# Patient Record
Sex: Female | Born: 1965 | Race: White | Hispanic: No | Marital: Married | Smoking: Never smoker
Health system: Southern US, Community
[De-identification: ages and names within clinical notes are randomized; demographics above are authoritative.]

## PROBLEM LIST (undated history)

## (undated) ENCOUNTER — Emergency Department (HOSPITAL_BASED_OUTPATIENT_CLINIC_OR_DEPARTMENT_OTHER): Payer: 59 | Source: Home / Self Care

## (undated) DIAGNOSIS — Z8672 Personal history of thrombophlebitis: Secondary | ICD-10-CM

## (undated) DIAGNOSIS — M1990 Unspecified osteoarthritis, unspecified site: Secondary | ICD-10-CM

## (undated) DIAGNOSIS — Z973 Presence of spectacles and contact lenses: Secondary | ICD-10-CM

## (undated) DIAGNOSIS — N840 Polyp of corpus uteri: Secondary | ICD-10-CM

## (undated) DIAGNOSIS — T7840XA Allergy, unspecified, initial encounter: Secondary | ICD-10-CM

## (undated) DIAGNOSIS — R112 Nausea with vomiting, unspecified: Secondary | ICD-10-CM

## (undated) DIAGNOSIS — J45990 Exercise induced bronchospasm: Secondary | ICD-10-CM

## (undated) DIAGNOSIS — R600 Localized edema: Secondary | ICD-10-CM

## (undated) DIAGNOSIS — Z862 Personal history of diseases of the blood and blood-forming organs and certain disorders involving the immune mechanism: Secondary | ICD-10-CM

## (undated) DIAGNOSIS — D649 Anemia, unspecified: Secondary | ICD-10-CM

## (undated) DIAGNOSIS — D259 Leiomyoma of uterus, unspecified: Secondary | ICD-10-CM

## (undated) DIAGNOSIS — Z9889 Other specified postprocedural states: Secondary | ICD-10-CM

## (undated) DIAGNOSIS — D219 Benign neoplasm of connective and other soft tissue, unspecified: Secondary | ICD-10-CM

## (undated) DIAGNOSIS — I8290 Acute embolism and thrombosis of unspecified vein: Secondary | ICD-10-CM

## (undated) DIAGNOSIS — J45909 Unspecified asthma, uncomplicated: Secondary | ICD-10-CM

## (undated) HISTORY — PX: APPENDECTOMY: SHX54

## (undated) HISTORY — DX: Allergy, unspecified, initial encounter: T78.40XA

## (undated) HISTORY — PX: BACK SURGERY: SHX140

## (undated) HISTORY — PX: INNER EAR SURGERY: SHX679

## (undated) HISTORY — PX: TYMPANOSTOMY TUBE PLACEMENT: SHX32

## (undated) HISTORY — PX: ANTERIOR FUSION LUMBAR SPINE: SUR629

## (undated) HISTORY — PX: LAPAROTOMY: SHX154

## (undated) HISTORY — PX: LAPAROSCOPIC GASTRIC BYPASS: SUR771

## (undated) HISTORY — PX: LUMBAR DISC SURGERY: SHX700

## (undated) HISTORY — PX: ROUX-EN-Y GASTRIC BYPASS: SHX1104

## (undated) HISTORY — PX: BREAST SURGERY: SHX581

## (undated) HISTORY — DX: Unspecified osteoarthritis, unspecified site: M19.90

## (undated) HISTORY — PX: REDUCTION MAMMAPLASTY: SUR839

---
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
## 1993-01-14 HISTORY — PX: REDUCTION MAMMAPLASTY: SUR839

## 1998-08-18 ENCOUNTER — Other Ambulatory Visit: Admission: RE | Admit: 1998-08-18 | Discharge: 1998-08-18 | Payer: Self-pay | Admitting: Obstetrics and Gynecology

## 2000-03-24 ENCOUNTER — Other Ambulatory Visit: Admission: RE | Admit: 2000-03-24 | Discharge: 2000-03-24 | Payer: Self-pay | Admitting: Family Medicine

## 2000-03-24 ENCOUNTER — Encounter: Payer: Self-pay | Admitting: Family Medicine

## 2000-03-24 LAB — CONVERTED CEMR LAB: Pap Smear: NORMAL

## 2001-08-25 ENCOUNTER — Other Ambulatory Visit: Admission: RE | Admit: 2001-08-25 | Discharge: 2001-08-25 | Payer: Self-pay | Admitting: Obstetrics and Gynecology

## 2001-11-03 ENCOUNTER — Encounter: Admission: RE | Admit: 2001-11-03 | Discharge: 2001-11-03 | Payer: Self-pay | Admitting: Obstetrics and Gynecology

## 2001-11-03 ENCOUNTER — Encounter: Payer: Self-pay | Admitting: Obstetrics and Gynecology

## 2002-12-15 ENCOUNTER — Other Ambulatory Visit: Admission: RE | Admit: 2002-12-15 | Discharge: 2002-12-15 | Payer: Self-pay | Admitting: Obstetrics and Gynecology

## 2003-01-26 ENCOUNTER — Encounter: Admission: RE | Admit: 2003-01-26 | Discharge: 2003-04-26 | Payer: Self-pay | Admitting: Surgery

## 2003-01-28 ENCOUNTER — Encounter: Admission: RE | Admit: 2003-01-28 | Discharge: 2003-01-28 | Payer: Self-pay | Admitting: Surgery

## 2003-02-02 ENCOUNTER — Encounter: Payer: Self-pay | Admitting: Cardiovascular Disease

## 2003-02-02 ENCOUNTER — Encounter: Admission: RE | Admit: 2003-02-02 | Discharge: 2003-02-02 | Payer: Self-pay | Admitting: Surgery

## 2003-02-02 ENCOUNTER — Ambulatory Visit (HOSPITAL_COMMUNITY): Admission: RE | Admit: 2003-02-02 | Discharge: 2003-02-02 | Payer: Self-pay | Admitting: Surgery

## 2003-03-28 ENCOUNTER — Inpatient Hospital Stay (HOSPITAL_COMMUNITY): Admission: RE | Admit: 2003-03-28 | Discharge: 2003-03-31 | Payer: Self-pay | Admitting: Surgery

## 2003-05-11 ENCOUNTER — Encounter: Admission: RE | Admit: 2003-05-11 | Discharge: 2003-08-09 | Payer: Self-pay | Admitting: Surgery

## 2003-09-21 ENCOUNTER — Encounter: Admission: RE | Admit: 2003-09-21 | Discharge: 2003-12-20 | Payer: Self-pay | Admitting: Surgery

## 2003-12-28 ENCOUNTER — Encounter: Admission: RE | Admit: 2003-12-28 | Discharge: 2004-03-27 | Payer: Self-pay | Admitting: Surgery

## 2004-03-14 ENCOUNTER — Other Ambulatory Visit: Admission: RE | Admit: 2004-03-14 | Discharge: 2004-03-14 | Payer: Self-pay | Admitting: Obstetrics and Gynecology

## 2004-03-29 ENCOUNTER — Ambulatory Visit: Payer: Self-pay | Admitting: Family Medicine

## 2004-04-11 ENCOUNTER — Encounter: Admission: RE | Admit: 2004-04-11 | Discharge: 2004-07-10 | Payer: Self-pay | Admitting: Surgery

## 2004-07-11 ENCOUNTER — Encounter: Admission: RE | Admit: 2004-07-11 | Discharge: 2004-10-09 | Payer: Self-pay | Admitting: Surgery

## 2004-10-24 ENCOUNTER — Encounter: Admission: RE | Admit: 2004-10-24 | Discharge: 2005-01-22 | Payer: Self-pay | Admitting: Surgery

## 2005-01-03 ENCOUNTER — Ambulatory Visit: Payer: Self-pay | Admitting: Family Medicine

## 2005-01-15 ENCOUNTER — Ambulatory Visit: Payer: Self-pay | Admitting: Family Medicine

## 2005-02-06 ENCOUNTER — Encounter: Admission: RE | Admit: 2005-02-06 | Discharge: 2005-05-07 | Payer: Self-pay | Admitting: Surgery

## 2005-05-08 ENCOUNTER — Other Ambulatory Visit: Admission: RE | Admit: 2005-05-08 | Discharge: 2005-05-08 | Payer: Self-pay | Admitting: Obstetrics and Gynecology

## 2005-05-15 ENCOUNTER — Encounter: Admission: RE | Admit: 2005-05-15 | Discharge: 2005-05-15 | Payer: Self-pay | Admitting: Surgery

## 2005-08-22 ENCOUNTER — Ambulatory Visit: Payer: Self-pay | Admitting: Family Medicine

## 2006-04-14 ENCOUNTER — Ambulatory Visit (HOSPITAL_COMMUNITY): Admission: RE | Admit: 2006-04-14 | Discharge: 2006-04-14 | Payer: Self-pay | Admitting: Surgery

## 2006-05-29 ENCOUNTER — Ambulatory Visit (HOSPITAL_COMMUNITY): Admission: RE | Admit: 2006-05-29 | Discharge: 2006-05-29 | Payer: Self-pay | Admitting: Orthopedic Surgery

## 2006-06-10 ENCOUNTER — Encounter (INDEPENDENT_AMBULATORY_CARE_PROVIDER_SITE_OTHER): Payer: Self-pay | Admitting: *Deleted

## 2006-08-20 ENCOUNTER — Encounter: Admission: RE | Admit: 2006-08-20 | Discharge: 2006-08-20 | Payer: Self-pay | Admitting: Obstetrics and Gynecology

## 2006-11-04 ENCOUNTER — Ambulatory Visit: Payer: Self-pay | Admitting: Family Medicine

## 2006-11-04 DIAGNOSIS — E785 Hyperlipidemia, unspecified: Secondary | ICD-10-CM | POA: Insufficient documentation

## 2006-11-04 DIAGNOSIS — L708 Other acne: Secondary | ICD-10-CM | POA: Insufficient documentation

## 2006-11-04 DIAGNOSIS — F988 Other specified behavioral and emotional disorders with onset usually occurring in childhood and adolescence: Secondary | ICD-10-CM | POA: Insufficient documentation

## 2006-11-04 DIAGNOSIS — M549 Dorsalgia, unspecified: Secondary | ICD-10-CM | POA: Insufficient documentation

## 2006-11-05 ENCOUNTER — Encounter: Admission: RE | Admit: 2006-11-05 | Discharge: 2006-11-05 | Payer: Self-pay | Admitting: Family Medicine

## 2006-11-05 ENCOUNTER — Encounter: Payer: Self-pay | Admitting: Family Medicine

## 2006-11-07 ENCOUNTER — Ambulatory Visit (HOSPITAL_COMMUNITY): Admission: RE | Admit: 2006-11-07 | Discharge: 2006-11-08 | Payer: Self-pay | Admitting: Neurosurgery

## 2006-12-26 ENCOUNTER — Ambulatory Visit (HOSPITAL_COMMUNITY): Admission: RE | Admit: 2006-12-26 | Discharge: 2006-12-27 | Payer: Self-pay | Admitting: Neurosurgery

## 2007-05-11 ENCOUNTER — Ambulatory Visit (HOSPITAL_COMMUNITY): Admission: RE | Admit: 2007-05-11 | Discharge: 2007-05-11 | Payer: Self-pay | Admitting: Neurosurgery

## 2007-05-18 ENCOUNTER — Ambulatory Visit: Payer: Self-pay | Admitting: *Deleted

## 2007-05-18 ENCOUNTER — Inpatient Hospital Stay (HOSPITAL_COMMUNITY): Admission: RE | Admit: 2007-05-18 | Discharge: 2007-05-22 | Payer: Self-pay | Admitting: Neurosurgery

## 2007-09-03 ENCOUNTER — Encounter (INDEPENDENT_AMBULATORY_CARE_PROVIDER_SITE_OTHER): Payer: Self-pay | Admitting: *Deleted

## 2007-09-03 ENCOUNTER — Ambulatory Visit: Payer: Self-pay | Admitting: Internal Medicine

## 2007-09-03 DIAGNOSIS — R5383 Other fatigue: Secondary | ICD-10-CM

## 2007-09-03 DIAGNOSIS — D649 Anemia, unspecified: Secondary | ICD-10-CM

## 2007-09-03 DIAGNOSIS — R5381 Other malaise: Secondary | ICD-10-CM | POA: Insufficient documentation

## 2007-09-03 LAB — CONVERTED CEMR LAB
Folate: 7.8 ng/mL
HCT: 36.6 % (ref 36.0–46.0)
Hemoglobin: 12.4 g/dL (ref 12.0–15.0)
Iron: 42 ug/dL (ref 42–145)
Saturation Ratios: 8.1 % — ABNORMAL LOW (ref 20.0–50.0)
Vit D, 1,25-Dihydroxy: 38 (ref 30–89)

## 2007-09-16 ENCOUNTER — Encounter: Admission: RE | Admit: 2007-09-16 | Discharge: 2007-09-16 | Payer: Self-pay | Admitting: Obstetrics and Gynecology

## 2008-03-26 IMAGING — CR DG LUMBAR SPINE 1V
1 series · 1 of 1 positions shown · non-contrast
Comparison: none

CLINICAL DATA: L5-S1 microdiskectomy.
 PORTABLE LATERAL LUMBAR SPINE ? 1 VIEW:

[view not recorded]
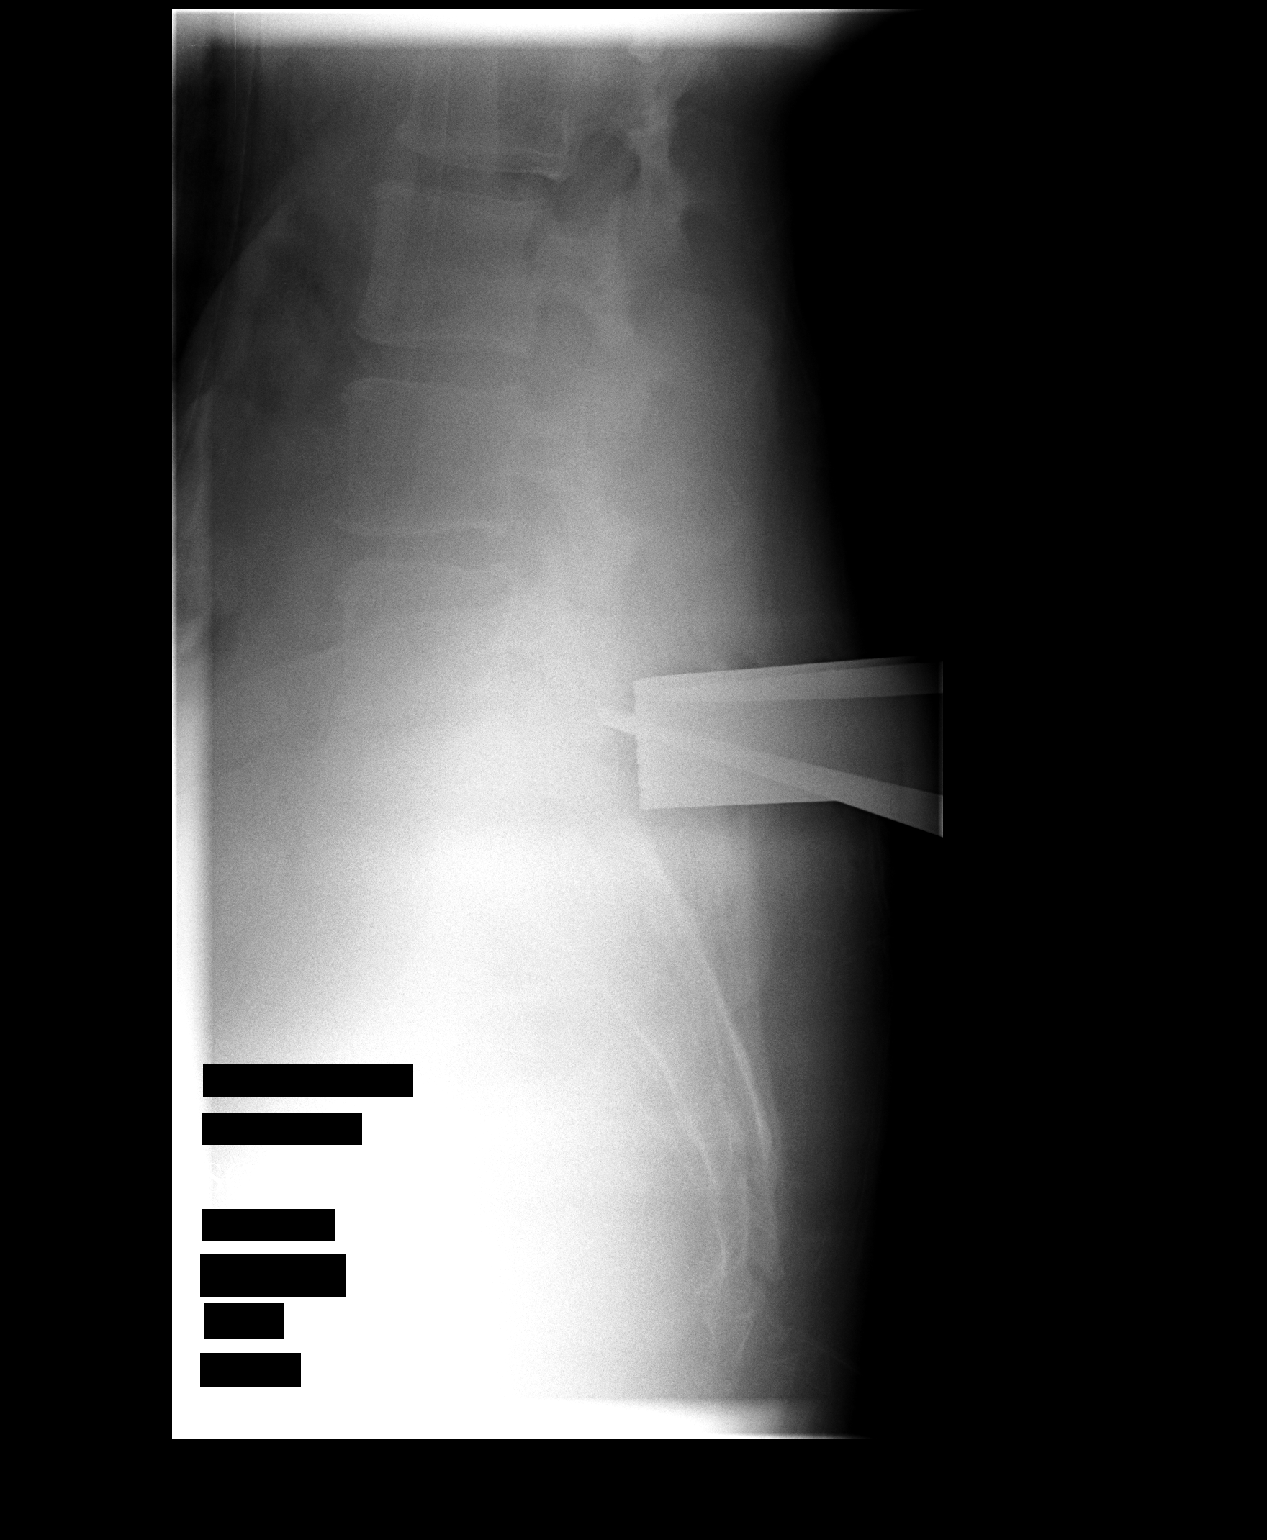

[1 of 1 positions shown; findings below may reference images not displayed]

FINDINGS: Tissue spreaders are in place posteriorly with probe directed at the L5-S1 disk space level.
IMPRESSION: As discussed above.

## 2008-04-13 ENCOUNTER — Ambulatory Visit: Payer: Self-pay | Admitting: Family Medicine

## 2008-05-11 ENCOUNTER — Encounter: Payer: Self-pay | Admitting: Family Medicine

## 2008-06-10 ENCOUNTER — Telehealth (INDEPENDENT_AMBULATORY_CARE_PROVIDER_SITE_OTHER): Payer: Self-pay | Admitting: *Deleted

## 2008-06-22 ENCOUNTER — Telehealth (INDEPENDENT_AMBULATORY_CARE_PROVIDER_SITE_OTHER): Payer: Self-pay | Admitting: *Deleted

## 2008-10-28 ENCOUNTER — Telehealth: Payer: Self-pay | Admitting: Family Medicine

## 2008-11-08 ENCOUNTER — Telehealth: Payer: Self-pay | Admitting: Family Medicine

## 2008-11-14 ENCOUNTER — Encounter: Admission: RE | Admit: 2008-11-14 | Discharge: 2008-11-14 | Payer: Self-pay | Admitting: Obstetrics and Gynecology

## 2008-12-13 ENCOUNTER — Telehealth: Payer: Self-pay | Admitting: Family Medicine

## 2009-01-27 ENCOUNTER — Telehealth: Payer: Self-pay | Admitting: Family Medicine

## 2009-03-28 ENCOUNTER — Telehealth: Payer: Self-pay | Admitting: Family Medicine

## 2009-05-08 ENCOUNTER — Telehealth: Payer: Self-pay | Admitting: Family Medicine

## 2009-05-08 DIAGNOSIS — J45909 Unspecified asthma, uncomplicated: Secondary | ICD-10-CM | POA: Insufficient documentation

## 2009-05-24 ENCOUNTER — Telehealth: Payer: Self-pay | Admitting: Family Medicine

## 2009-06-07 ENCOUNTER — Ambulatory Visit: Payer: Self-pay | Admitting: Critical Care Medicine

## 2009-07-31 ENCOUNTER — Telehealth: Payer: Self-pay | Admitting: Family Medicine

## 2009-08-02 ENCOUNTER — Ambulatory Visit: Payer: Self-pay | Admitting: Family Medicine

## 2009-08-02 DIAGNOSIS — M79609 Pain in unspecified limb: Secondary | ICD-10-CM | POA: Insufficient documentation

## 2009-08-09 ENCOUNTER — Ambulatory Visit: Payer: Self-pay | Admitting: Critical Care Medicine

## 2009-09-11 ENCOUNTER — Telehealth: Payer: Self-pay | Admitting: Family Medicine

## 2009-10-04 ENCOUNTER — Telehealth: Payer: Self-pay | Admitting: Family Medicine

## 2009-10-25 ENCOUNTER — Ambulatory Visit: Payer: Self-pay | Admitting: Family Medicine

## 2009-10-25 ENCOUNTER — Telehealth: Payer: Self-pay | Admitting: Family Medicine

## 2009-10-25 LAB — CONVERTED CEMR LAB
BUN: 11 mg/dL (ref 6–23)
Basophils Absolute: 0.1 10*3/uL (ref 0.0–0.1)
Chloride: 106 meq/L (ref 96–112)
Creatinine, Ser: 0.8 mg/dL (ref 0.4–1.2)
Eosinophils Relative: 0.7 % (ref 0.0–5.0)
Glucose, Bld: 149 mg/dL — ABNORMAL HIGH (ref 70–99)
Lymphocytes Relative: 26.4 % (ref 12.0–46.0)
Lymphs Abs: 2.2 10*3/uL (ref 0.7–4.0)
MCV: 84.9 fL (ref 78.0–100.0)
Monocytes Relative: 6.6 % (ref 3.0–12.0)
Neutro Abs: 5.4 10*3/uL (ref 1.4–7.7)
Sodium: 140 meq/L (ref 135–145)

## 2009-11-22 ENCOUNTER — Encounter: Admission: RE | Admit: 2009-11-22 | Discharge: 2009-11-22 | Payer: Self-pay | Admitting: Obstetrics and Gynecology

## 2009-12-12 ENCOUNTER — Encounter: Payer: Self-pay | Admitting: Family Medicine

## 2010-02-13 NOTE — Progress Notes (Signed)
  Phone Note Call from Patient   Summary of Call: needs refil vyvanse   Follow-up for Phone Call        will px and give to her  Follow-up by: Judith Part MD,  January 27, 2009 5:32 PM    Prescriptions: VYVANSE 70 MG CAPS (LISDEXAMFETAMINE DIMESYLATE) 1 by mouth once daily  #30 x 0   Entered and Authorized by:   Judith Part MD   Signed by:   Judith Part MD on 01/27/2009   Method used:   Print then Give to Patient   RxID:   [id number]

## 2010-02-13 NOTE — Assessment & Plan Note (Signed)
Summary: Pulmonary OV   CC:  2 month follow up.  Pt states she is still having SOB when climbing stairs but overall qvar is helping.  Wheezing with extreme exertion.  Nonprod cough..  History of Present Illness: Pulmonary OV 45 yo WF with moderate persistent asthma and GERD.  EIA.  This is a 45 year old female referred  for  dyspnea, cough.   Patient has had symptoms since December, 2010.   Symptoms only come on with exercise.  There is associated dry cough. There's no real mucus. There is occasional wheezing. There is no childhood  history of asthma.   The pt  has had a barking cough exacerbated with bronchitis twice yearly for the past several years.   The symptoms are worse in the winter.  There has not been any skin testing as an adult.   She is only using Advair on a p.r.n. basis. Her exercise. She has used albuterol the past without much improvement. Her symptoms worsen  as she becomes more cardiovascular stressed  with exercise as the heart rate increases.  She has not had recent corticosteroids for her symptom complex. There is a family hx of asthma and allergies.  There was passive smoke exposure growing up with the pts father  August 09, 2009 4:16 PM Pt is improved with less cough and dyspnea.  There is no chest pain.  There is no wheeze.  The cough is much better.   PFM up to 450. Pt notes more pn drip in the heat and humidity August 09, 2009 4:16 PM Seems some better.  ? broncitis last week with barky cough but did not progress,  still clears throat.  did increase qvar and this helped PFM:  350 best ever.  afer inhalers etc up to 450 still mucus in throat area   Asthma History    Asthma Control Assessment:    Age range: 12+ years    Symptoms: 0-2 days/week    Nighttime Awakenings: 0-2/month    Interferes w/ normal activity: no limitations    SABA use (not for EIB): 0-2 days/week    ATAQ questionnaire: 0    FEV1: 2.69 liters (today)    FEV1 Pred: 3.13 liters (today)  Exacerbations requiring oral systemic steroids: 0-1/year    Asthma Control Assessment: Well Controlled  Preventive Screening-Counseling & Management  Alcohol-Tobacco     Smoking Status: never     Passive Smoke Exposure: yes  Current Medications (verified): 1)  Yaz 3-0.02 Mg  Tabs (Drospirenone-Ethinyl Estradiol) .... Take By Mouth Once Daily As Directed 2)  Vyvanse 70 Mg Caps (Lisdexamfetamine Dimesylate) .Marland Kitchen.. 1 By Mouth Once Daily 3)  Allegra 180 Mg Tabs (Fexofenadine Hcl) .... Once Daily 4)  Qvar 40 Mcg/act  Aers (Beclomethasone Dipropionate) .... Two Puffs Twice Daily 5)  Prilosec Otc 20 Mg Tbec (Omeprazole Magnesium) .... Once Daily As Needed 6)  Proair Hfa 108 (90 Base) Mcg/act  Aers (Albuterol Sulfate) .Marland Kitchen.. 1-2 Puffs Every 4-6 Hours As Needed 7)  Caltrate 600+d 600-400 Mg-Unit Tabs (Calcium Carbonate-Vitamin D) .... Take 1 Tablet By Mouth Two Times A Day 8)  Vitamin D3 1000 Unit Tabs (Cholecalciferol) .... Take 1 Tablet By Mouth Once A Day  Allergies (verified): 1)  ! Levaquin (Levofloxacin) 2)  * Medical Tape  Past History:  Past medical, surgical, family and social histories (including risk factors) reviewed, and no changes noted (except as noted below).  Past Medical History: Reviewed history from 04/13/2008 and no changes required. ADD obesity- with weight  loss surgery LS deg disc dz with multiple surgeries   Past Surgical History: Reviewed history from 04/13/2008 and no changes required. Laparoscopy- ovarian cyst Appendectomy Breast reduction (1995) Gastric bypass (03/2000) several LS surg including fusion  Family History: Reviewed history from 06/07/2009 and no changes required. Father: Lung COPD/asthma, smoker Mother: HTN Siblings:  Brother asthma,  sister with allergies  Social History: Reviewed history from 04/13/2008 and no changes required. 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  The patient denies shortness of breath at rest, productive cough, non-productive cough, coughing up blood, chest pain, irregular heartbeats, acid heartburn, indigestion, loss of appetite, weight change, abdominal pain, difficulty swallowing, sore throat, tooth/dental problems, headaches, nasal congestion/difficulty breathing through nose, sneezing, itching, ear ache, anxiety, depression, hand/feet swelling, joint stiffness or pain, rash, change in color of mucus, and fever.    Vital Signs:  Patient profile:   45 year old female Height:      66.5 inches Weight:      234 pounds BMI:     37.34 O2 Sat:      100 % on Room air Temp:     98.4 degrees F oral Pulse rate:   72 / minute BP sitting:   106 / 76  (left arm) Cuff size:   large  Vitals Entered By: Gweneth Dimitri RN (August 09, 2009 3:59 PM)  O2 Flow:  Room air CC: 2 month follow up.  Pt states she is still having SOB when climbing stairs but overall qvar is helping.  Wheezing with extreme exertion.  Nonprod cough. Comments Medications reviewed with patient Daytime contact number verified with patient. Gweneth Dimitri RN  August 09, 2009 4:01 PM    Physical Exam  General:  normal appearance   Eyes:  PERRLA/EOM intact; conjunctiva and sclera clear Nose:  clear nasal discharge and erythema.   Mouth:  no deformity or lesions, post nasal drainage seen Neck:  no masses, thyromegaly, or abnormal cervical nodes Lungs:  decreased BS bilateral improved Heart:  regular rate and rhythm, S1, S2 without murmurs, rubs, gallops, or clicks Abdomen:  bowel sounds positive; abdomen soft and non-tender without masses, or organomegaly obese Msk:  no deformity or scoliosis noted with normal posture Pulses:  pulses normal Extremities:  no clubbing, cyanosis, edema, or deformity noted Neurologic:  CN II-XII grossly intact with normal reflexes, coordination, muscle  strength and tone Skin:  intact without lesions or rashes Cervical Nodes:  no significant adenopathy Axillary Nodes:  no significant adenopathy Psych:  alert and cooperative; normal mood and affect; normal attention span and concentration   Pre-Spirometry FEV1    Value: 2.69 L     Pred: 3.13 L     Impression & Recommendations:  Problem # 1:  ASTHMA, PERSISTENT (ICD-493.90) Assessment Improved Moderate persistent asthma better on maintainence ICS. plan cont as needed SABA prior to exercise Cont Qvar two puff two times a day Veramyst trial for rhinitis  Medications Added to Medication List This Visit: 1)  Prilosec Otc 20 Mg Tbec (Omeprazole magnesium) .... Once daily as needed 2)  Caltrate 600+d 600-400 Mg-unit Tabs (Calcium carbonate-vitamin d) .... Take 1 tablet by mouth two times a day 3)  Vitamin D3 1000 Unit Tabs (Cholecalciferol) .... Take 1 tablet by mouth once a day  Complete Medication List: 1)  Yaz 3-0.02 Mg Tabs (Drospirenone-ethinyl  estradiol) .... Take by mouth once daily as directed 2)  Vyvanse 70 Mg Caps (Lisdexamfetamine dimesylate) .Marland Kitchen.. 1 by mouth once daily 3)  Allegra 180 Mg Tabs (Fexofenadine hcl) .... Once daily 4)  Qvar 40 Mcg/act Aers (Beclomethasone dipropionate) .... Two puffs twice daily 5)  Prilosec Otc 20 Mg Tbec (Omeprazole magnesium) .... Once daily as needed 6)  Proair Hfa 108 (90 Base) Mcg/act Aers (Albuterol sulfate) .Marland Kitchen.. 1-2 puffs every 4-6 hours as needed 7)  Caltrate 600+d 600-400 Mg-unit Tabs (Calcium carbonate-vitamin d) .... Take 1 tablet by mouth two times a day 8)  Vitamin D3 1000 Unit Tabs (Cholecalciferol) .... Take 1 tablet by mouth once a day  Other Orders: Est. Patient Level III (16109)   Patient Instructions: 1)  Stay with Qvar two puff twice daily for now 2)  Proair prior to exercise and as needed 3)  Try veramyst two sprays each nostril daily use samples , I did not send refill, just use samples 4)  Return as needed  ,   just call if you would like me to recheck

## 2010-02-13 NOTE — Assessment & Plan Note (Signed)
Summary: ANKLE PAIN/CBS   History of Present Illness: 45 yo woman here today for L foot pain.  pain w/ weight bearing.  heard a popping while in her yard.  pain increased yesterday while working out.  no bruise or swelling.  Allergies (verified): 1)  ! Levaquin (Levofloxacin) 2)  * Medical Tape  Review of Systems      See HPI  Physical Exam  Msk:  + TTP over L talar dome, no TTP over 4th or 5th metatarsal.  + dropped 2nd, possibly 3rd metatarsal head(s). Pulses:  +2 DP/PT Extremities:  no C/C/E   Impression & Recommendations:  Problem # 1:  FOOT PAIN (ICD-729.5) Assessment New  pain possibly due to dropped metatarsal head- could have been popping she heard.  must r/o stress fx given recent increase in exercise.  check Xray.  ice, NSAIDs.  ortho if no improvement.  Orders: T-Foot Left Min 3 Views (73630TC) No Charge Patient Arrived (NCPA0) (NCPA0)  Complete Medication List: 1)  Yaz 3-0.02 Mg Tabs (Drospirenone-ethinyl estradiol) .... Take by mouth once daily as directed 2)  Vyvanse 70 Mg Caps (Lisdexamfetamine dimesylate) .Marland Kitchen.. 1 by mouth once daily 3)  Vivotif Berna Vaccine Cpdr (Typhoid vaccine) .Marland Kitchen.. 1 by mouth every other day,  1 week prior to trip 4)  Allegra 180 Mg Tabs (Fexofenadine hcl) .... Once daily 5)  Qvar 40 Mcg/act Aers (Beclomethasone dipropionate) .... Two puffs twice daily 6)  Prilosec Otc 20 Mg Tbec (Omeprazole magnesium) .... Once daily 7)  Proair Hfa 108 (90 Base) Mcg/act Aers (Albuterol sulfate) .Marland Kitchen.. 1-2 puffs every 4-6 hours as needed

## 2010-02-13 NOTE — Miscellaneous (Signed)
Summary: bronchitis  Clinical Lists Changes  pt has been sick for 6 days.  subjective fevers, nasal congestion, deep cough, body aches.  unable to sleep due to cough.  lungs w/out crackles or wheeze on PE, coarse BS at bases.  will start Zpack for bronchitis and Tussionex for cough/sleep.  Neena Rhymes MD  December 12, 2009 11:08 AM.   Prescriptions: Sandria Senter ER 10-8 MG/5ML LQCR (HYDROCOD POLST-CHLORPHEN POLST) 1 tsp two times a day as needed for cough.  #140ml x 0   Entered and Authorized by:   Neena Rhymes MD   Signed by:   Neena Rhymes MD on 12/12/2009   Method used:   Print then Give to Patient   RxID:   [id number] AZITHROMYCIN 250 MG  TABS (AZITHROMYCIN) 2 by  mouth today and then 1 daily for 4 days  #6 x 0   Entered and Authorized by:   Neena Rhymes MD   Signed by:   Neena Rhymes MD on 12/12/2009   Method used:   Electronically to        [facility]  [address] (retail)       [address]       [place], [place]  [address]       Ph: [phone] or [phone]       Fax: [phone]   RxID:   [id number]

## 2010-02-13 NOTE — Progress Notes (Signed)
Summary: thumb tendonitis  Phone Note Call from Patient   Caller: Patient Summary of Call: pt has tendonitis of R thumb.  is R hand dominant.  needs to type for work.  daily celebrex and/or advil w/out relief.  has been wearing thumb splint w/out relief.  will start Prednisone and if no better, she is to call ortho.  Pt expresses understanding and is in agreement w/ this plan. Initial call taken by: Neena Rhymes MD,  September 11, 2009 11:24 AM    New/Updated Medications: PREDNISONE 20 MG TABS (PREDNISONE) 2 tabs daily x7 days Prescriptions: PREDNISONE 20 MG TABS (PREDNISONE) 2 tabs daily x7 days  #14 x 0   Entered and Authorized by:   Neena Rhymes MD   Signed by:   Neena Rhymes MD on 09/11/2009   Method used:   [facility] to        [facility]  [address] (retail)       [address]       [place], [place]  [address]       Ph: [phone] or [phone]       Fax: [phone]   RxID:   [id number]

## 2010-02-13 NOTE — Progress Notes (Signed)
Summary: Pulumonary appt scheduled by pt.   Phone Note Call from Patient   Summary of Call: asthma is worse- has been on a bunch of meds - worse with exercise needs ref for appt with Dr Delford Field in pulm   Follow-up for Phone Call        Pt scheduled appt herself already.Marland KitchenMarland KitchenDaine Gip  May 08, 2009 5:04 PM Follow-up by: Daine Gip,  May 08, 2009 5:04 PM  New Problems: ASTHMA, PERSISTENT (ICD-493.90)   New Problems: ASTHMA, PERSISTENT (ICD-493.90)

## 2010-02-13 NOTE — Progress Notes (Signed)
  Phone Note Call from Patient   Summary of Call: needs refil of vyvanse  Follow-up for Phone Call        printed for pick up Follow-up by: Judith Part MD,  July 31, 2009 10:30 AM    Prescriptions: VYVANSE 70 MG CAPS (LISDEXAMFETAMINE DIMESYLATE) 1 by mouth once daily  #30 x 0   Entered and Authorized by:   Judith Part MD   Signed by:   Judith Part MD on 07/31/2009   Method used:   Print then Give to Patient   RxID:   [id number]

## 2010-02-13 NOTE — Progress Notes (Signed)
  Phone Note Call from Patient   Summary of Call: needs refil of vyvanse for [facility]  Follow-up for Phone Call        printed and delivered  Follow-up by: Judith Part MD,  October 04, 2009 1:24 PM    Prescriptions: VYVANSE 70 MG CAPS (LISDEXAMFETAMINE DIMESYLATE) 1 by mouth once daily  #90 x 0   Entered and Authorized by:   Judith Part MD   Signed by:   Judith Part MD on 10/04/2009   Method used:   Print then Give to Patient   RxID:   [id number]

## 2010-02-13 NOTE — Progress Notes (Signed)
Summary: fatigue/GI illness  Phone Note Call from Patient   Caller: Patient Summary of Call: pt has been dealing w/ recent GI illness.  now having extreme fatigue.  fatigue pre-dated illness but has worsened w/ recent illness.  concerned b/c fatigue seems above and beyond normal viral sxs.  will have pt get labs to assess thyroid, r/o anemia, and assess electrolytes. Initial call taken by: Neena Rhymes MD,  October 25, 2009 8:39 AM  New Problems: FATIGUE (ICD-780.79)   New Problems: FATIGUE (ICD-780.79)

## 2010-02-13 NOTE — Progress Notes (Signed)
  Phone Note Call from Patient   Summary of Call: needs vyvanse px Initial call taken by: Judith Part MD,  March 28, 2009 10:37 AM  Follow-up for Phone Call        px done for pick up Follow-up by: Judith Part MD,  March 28, 2009 10:37 AM    New/Updated Medications: VYVANSE 70 MG CAPS (LISDEXAMFETAMINE DIMESYLATE) 1 by mouth once daily Prescriptions: VYVANSE 70 MG CAPS (LISDEXAMFETAMINE DIMESYLATE) 1 by mouth once daily  #30 x 0   Entered and Authorized by:   Judith Part MD   Signed by:   Judith Part MD on 03/28/2009   Method used:   Print then Give to Patient   RxID:   [id number]

## 2010-02-13 NOTE — Progress Notes (Signed)
  Phone Note Call from Patient   Summary of Call: needs vyvanse px  Initial call taken by: Judith Part MD,  May 24, 2009 11:16 AM  Follow-up for Phone Call        printed in put in nurse in box for pickup  Follow-up by: Judith Part MD,  May 24, 2009 11:16 AM    Prescriptions: VYVANSE 70 MG CAPS (LISDEXAMFETAMINE DIMESYLATE) 1 by mouth once daily  #30 x 0   Entered and Authorized by:   Judith Part MD   Signed by:   Judith Part MD on 05/24/2009   Method used:   Print then Give to Patient   RxID:   [id number]

## 2010-02-13 NOTE — Assessment & Plan Note (Signed)
Summary: Pulmonary Consultation   CC:  possible ashtma, new consult, and cough and dyspnea.  History of Present Illness: Pulmonary Consultation       This is a 45 year old female who presents with cough and dyspnea.  The patient complains of shortness of breath, chest tightness, wheezing, cough, and exercise induced symptoms, but denies history of diagnosed COPD, chest pain worse with breathing and coughing, mucous production, nocturnal awakening, and congestion.  The cough is described as non-productive and perceived to be from lower airway.  The dyspnea is described as dyspnea with exertion and associated with wheezing.  The patient reports a history of allergic rhinitis.  The patient denies any history of asthma, COPD, sleep disordered breathing, obstructive sleep apnea, heart disease, thyroid disease, anemia, collagen vascular disease, osteporosis, kyphoscoliosis, occupational exposure: , and cancer: .  Prior effective therapies include oral inhaled steroids.  Ineffective therapies have included beta agonists.    This is a 46 year old female referred  for  dyspnea, cough.   Patient has had symptoms since December, 2010.   Symptoms only come on with exercise.  There is associated dry cough. There's no real mucus. There is occasional wheezing. There is no childhood  history of asthma.   The pt  has had a barking cough exacerbated with bronchitis twice yearly for the past several years.   The symptoms are worse in the winter.  There has not been any skin testing as an adult.   She is only using Advair on a p.r.n. basis. Her exercise. She has used albuterol the past without much improvement. Her symptoms worsen  as she becomes more cardiovascular stressed  with exercise as the heart rate increases.  She has not had recent corticosteroids for her symptom complex. There is a family hx of asthma and allergies.  There was passive smoke exposure growing up with the pts father     Asthma History  Initial Asthma Severity Rating:    Age range: 12+ years    Symptoms: 0-2 days/week    Nighttime Awakenings: 0-2/month    Interferes w/ normal activity: no limitations    SABA use (not for EIB): 0-2 days/week    Exacerbations requiring oral systemic steroids: 0-1/year    Asthma Severity Assessment: Intermittent   Preventive Screening-Counseling & Management  Alcohol-Tobacco     Smoking Status: never     Passive Smoke Exposure: yes  Comments: Father was a heavy smoker   Current Medications (verified): 1)  Yaz 3-0.02 Mg  Tabs (Drospirenone-Ethinyl Estradiol) .... Take By Mouth Once Daily As Directed 2)  Vyvanse 70 Mg Caps (Lisdexamfetamine Dimesylate) .Marland Kitchen.. 1 By Mouth Once Daily 3)  Vivotif Berna Vaccine  Cpdr (Typhoid Vaccine) .Marland Kitchen.. 1 By Mouth Every Other Day,  1 Week Prior To Trip 4)  Allegra 180 Mg Tabs (Fexofenadine Hcl) .... Once Daily 5)  Advair Diskus 250-50 Mcg/dose Aepb (Fluticasone-Salmeterol) .... As Needed 1 Inhalation  Twice Daily 6)  Prilosec Otc 20 Mg Tbec (Omeprazole Magnesium) .... Once Daily  Allergies: 1)  ! Levaquin (Levofloxacin) 2)  * Medical Tape  Past History:  Past Medical History: Reviewed history from 04/13/2008 and no changes required. 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 Laparoscopy- ovarian cyst Appendectomy Breast reduction (1995) Gastric bypass (03/2000) several LS surg including fusion  Family History: Reviewed history from 11/04/2006 and no changes required. Father: Lung COPD/asthma, smoker Mother: HTN  Siblings:  Brother asthma,  sister with allergies  Social History: Reviewed history from 04/13/2008 and no changes required. 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  The patient denies shortness of breath at rest, productive cough, non-productive cough, coughing up blood, chest pain, irregular heartbeats, acid heartburn, loss of appetite, weight change, abdominal pain, difficulty swallowing, tooth/dental problems, headaches, nasal congestion/difficulty breathing through nose, itching, hand/feet swelling, joint stiffness or pain, rash, change in color of mucus, and fever.         Patient states she often feels like she has fluid in her ears.      See HPI for Pulmonary, Cardiac, General, and ENT review of systems.  Vital Signs:  Patient profile:   45 year old female Height:      66.5 inches Weight:      242 pounds BMI:     38.61 O2 Sat:      100 % on Room air Temp:     98.7 degrees F oral Pulse rate:   79 / minute BP sitting:   128 / 88  (left arm) Cuff size:   large  Vitals Entered By: Denna Haggard, CMA (Jun 07, 2009 3:38 PM)  Nutrition Counseling: Patient's BMI is greater than 25 and therefore counseled on weight management options.  O2 Sat at Rest %:  100% O2 Flow:  Room air  Reason for Visit Asthma confirmation  Physical Exam  General:  normal appearance and obese.   Eyes:  PERRLA/EOM intact; conjunctiva and sclera clear Nose:  clear nasal discharge and erythema.   Mouth:  no deformity or lesions, post nasal drainage seen Neck:  no masses, thyromegaly, or abnormal cervical nodes Lungs:  decreased BS bilateral.   Heart:  regular rate and rhythm, S1, S2 without murmurs, rubs, gallops, or clicks Abdomen:  bowel sounds positive; abdomen soft and non-tender without masses, or organomegaly obese Msk:  no deformity or scoliosis noted with normal posture Pulses:  pulses normal Extremities:  no clubbing, cyanosis, edema, or deformity noted Neurologic:  CN II-XII grossly intact with normal reflexes, coordination, muscle strength and tone Skin:  intact  without lesions or rashes Cervical Nodes:  no significant adenopathy Axillary Nodes:  no significant adenopathy Inguinal Nodes:  no significant adenopathy Psych:  alert and cooperative; normal mood and affect; normal attention span and concentration  Immunoglobulin E          66.5 IU/mL                  0.0-180.0  CXR  Procedure date:  06/07/2009  Findings:      Findings: Trachea is midline.  Heart size normal.  Lungs are clear. No pleural fluid.   IMPRESSION: No acute findings.  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  The baseline spirometry today in the  office is normal.  This does not rule out hyperreactive airways disease. Note elevated IgE levels of 66. This points to an associated atopic condition.  There is associated allergic rhinitis. A cardiopulmonary stress test with pre-and post spirometry   may be indicated to rule out exercise-induced asthma. Inhaled corticosteroids are necessary at this time and advair should not be used on an as-needed basis. Note Cxr is normal today  plan D/c advair Start Qvar two puff two times a day on schedule as needed proair obtain and monitor PFR chk RAST assay  Medications Added to Medication List This Visit: 1)  Allegra 180 Mg Tabs (Fexofenadine hcl) .... Once daily 2)  Advair Diskus 250-50 Mcg/dose Aepb (Fluticasone-salmeterol) .... As needed 1 inhalation  twice daily 3)  Qvar 40 Mcg/act Aers (Beclomethasone dipropionate) .... Two puffs twice daily 4)  Prilosec Otc 20 Mg Tbec (Omeprazole magnesium) .... Once daily 5)  Proair Hfa 108 (90 Base) Mcg/act Aers (Albuterol sulfate)  .Marland Kitchen.. 1-2 puffs every 4-6 hours as needed  Complete Medication List: 1)  Yaz 3-0.02 Mg Tabs (Drospirenone-ethinyl estradiol) .... Take by mouth once daily as directed 2)  Vyvanse 70 Mg Caps (Lisdexamfetamine dimesylate) .Marland Kitchen.. 1 by mouth once daily 3)  Vivotif Berna Vaccine Cpdr (Typhoid vaccine) .Marland Kitchen.. 1 by mouth every other day,  1 week prior to trip 4)  Allegra 180 Mg Tabs (Fexofenadine hcl) .... Once daily 5)  Qvar 40 Mcg/act Aers (Beclomethasone dipropionate) .... Two puffs twice daily 6)  Prilosec Otc 20 Mg Tbec (Omeprazole magnesium) .... Once daily 7)  Proair Hfa 108 (90 Base) Mcg/act Aers (Albuterol sulfate) .Marland Kitchen.. 1-2 puffs every 4-6 hours as needed  Other Orders: Spirometry w/Graph (16109) New Patient Level V (60454) Prescription Created Electronically [phone]) T-"RAST" (Allergy Full Profile) IGE ([id number]) T-2 View CXR (71020TC)  Patient Instructions: 1)  Trial Qvar two puffs twice daily 2)  Stop Advair 3)  Proair as needed,  do two puffs prior to each intense exercise period 4)  Labs today: IgE Rast 5)  Chest xray today 6)  Call me with report on breathing in next week 7)  Return for recheck two months Prescriptions: QVAR 40 MCG/ACT  AERS (BECLOMETHASONE DIPROPIONATE) Two puffs twice daily  #1 x 6   Entered and Authorized by:   Storm Frisk MD   Signed by:   Storm Frisk MD on 06/07/2009   Method used:   Electronically to        [facility]* (retail)       [address]       [place], [place]  [address]       Ph: [phone]       Fax: [phone]   RxID:   [id number]    [employer]Perfect Spirometry  ID: [id number] Patient: [name] DOB: [birth year] Age: 45 Years Old Sex: Female Race: White Physician: Colon Flattery Tower MD Height: 66.5 Weight: 242 Status: Confirmed Past Medical History:  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

## 2010-05-29 NOTE — Op Note (Signed)
[name], [name] NO.:  [id number]   MEDICAL RECORD NO.:  [id number]          PATIENT TYPE:  INP   LOCATION:  3110                         FACILITY:  [facility]   PHYSICIAN:  Danae Orleans. Venetia Maxon, M.D.  DATE OF BIRTH:  Oct 07, 1965   DATE OF PROCEDURE:  05/18/2007  DATE OF DISCHARGE:                               OPERATIVE REPORT   PREOPERATIVE DIAGNOSES:  1. 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 with left L5      radiculopathy.  2. Degenerative disease.  3. Spondylosis.  4. Low back pain.   POSTOPERATIVE DIAGNOSES:  1. 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  2. Degenerative disease.  3. Spondylosis.  4. Low back pain.   PROCEDURES:  1. Redo diskectomy L5-S1, left.  2. Attempted left transforaminal lumbar interbody fusion, L5-S1.  3. Posterolateral arthrodesis L5-S1 with bone autograft, bone      morphogenetic protein, and FortrOss.  4. Anterior lumbar interbody fusion at L5-S1 with PEEK cage autograft      FortrOss.  5. Microdissection.   SURGEON:  Danae Orleans. Venetia Maxon, MD   ASSISTANTS:  1. Georgiann Cocker, RN  2. Clydene Fake, MD  3. Balinda Quails, MD   ANESTHESIA:  General endotracheal anesthesia.   ESTIMATED BLOOD LOSS:  750 mL.   COMPLICATIONS:  1. Dislodged PLIF cage.  2. Left iliac vein injury.   INDICATIONS:  [name] is 45 year old woman who is status post  gastric bypass who has had large disk herniations at L5-S1 on the left.  She has had one recurrence and then very recently had a large foraminal  recurrence.  The plan was to remove this recurrent disk herniation and  to perform a TLIF fusion and posterolateral arthrodesis and pedicle  screws.   PROCEDURE:  The patient was brought to the operating room.  She was  placed in a prone position on the Parkway table.  Her low back was  prepped and draped in the usual sterile fashion.  Incision was carried  after infiltrating the skin and  subcutaneous tissues with lidocaine.  The skin of the incision was carried to the lumbodorsal fascia which was  incised on left bilaterally.  Intraoperative x-ray was obtained with  marker probes at the L5 and S1 pedicles after initial x-ray demonstrated  markers at S1 and S2.  Subsequently, the previous laminectomy defect of  L5 on the left was cleared of investing scar tissue and un-using  microdissection technique, the left L5 nerve root was very carefully  cleared of investing scar and an extremely large recurrent disk fragment  was removed with resultant decompression of the thecal sac and L5 and S1  nerve roots.  The patient has had prior left posterior diskectomy and  also for lateral diskectomy.  The inner space was cleared of investing  soft tissue, and endplates were decorticated in preparation for  interbody grafting.  A laminar spreader was placed between the L5 and S1  pedicle on the left to the open interval for grafting.  It was  elected  to place a PLIF posterior lumbar interbody cage from a TLIF approach  from the left and not to violate the right side of the interspace, which  has not had prior surgery.  The PLIF spacer was then 8 mm x 24 mm.  PLIF  spacer was packed with small BMP and FortrOss reconstituted with the  patient's  autogenous blood and additionally bone autograft, clear  investing soft tissue after being removed from the local laminectomy  site.  After placing the PLIF cage in an effort to countersink the cage,  it went through the what appeared to be an annular defect ventrally and  after effort to remove it, it was not felt to be recoverable.  In the  process of removing it, a very small amount of venous bleeding was  identified, but on obtaining fluoroscopic images of the position of the  cage, it was felt that this was in very close proximity to the left  iliac vein, and it would not be safe to try further efforts to remove  this posteriorly.   Subsequently the posterolateral region was  decorticated on the right side of midline and remaining BMP and FortrOss  was placed in this location.  The posterior wound was then closed with  one Vicryl sutures reapproximating the fascia, 2-0 Vicryl and 3-0 Vicryl  were interrupted and inverted tissues were reapproximated with skin  edges and the wound was dressed with Steri-Strips, Telfa gauze, and  tape.  Intraoperatively, I consulted with Dr. Liliane Bade and explained  the situation and said that I needed to remove the migrated cage and  also to inspect iliac vasculature and make sure there was no injury to  these vessels and at the same time to perform an anterior lumbar fusion.  We have consequently repositioned the patient.  Intraoperatively, I went  and spoke with the patient's mother about this situation and I explained  that we needed to perform an anterior removal of the posterior cage and  then also to perform an anterior lumbar approach, and we exposed the L5-  S1 interspace.  He will dictate his exposure separately.  At the time of  the exposure, the cage was identified.  There was a small opening in the  lateral wall of the iliac vein.  Dr. Madilyn Fireman elected to place Gelfoam over  this, but not to perform any and not to repair this defect.  After the  Gelfoam had been placed, there appeared to be cessation of any bleeding,  and the table-mounted Thompson retractor was placed to exposing the L5-  S1 interspace and an intraoperative x-ray confirmed correct orientation  with a marker with a needle at the L5-S1 level.  The interspace was then  incised with 15-blade, disk material was removed in piecemeal fashion.  The endplates were stripped of residual disk material and the remaining  cartilaginous material was also removed.  After trial sizing millimeter,  a SynFix-LR 13 x 38 mm, 12-mm height, 12 degree lordosis ALIF cage.  This was placed and locking screws were placed.  Prior to  doing so, it  was packed with extra small BMP and FortrOss with autogenous blood  removed with a Jamshidi needle from the vertebra.  The radiographs  demonstrated well-positioned interbody graft and screws.  The iliac vein  was then inspected and did not appear to be any bleeding.  The retractor  was removed.  The fascia was closed with #1 Vicryl suture, and the  subcutaneous  tissues were approximated with 2-0 Vicryl and 3-0 Vicryl  interrupted inverted sutures, and the wound was dressed with Benzoin,  Steri-Strips, Telfa gauze, and tape.  Final x-ray demonstrated no  evidence of any retained foreign bodies within the abdominal wound.      Danae Orleans. Venetia Maxon, M.D.  Electronically Signed     JDS/MEDQ  D:  05/18/2007  T:  05/19/2007  Job:  [id number]

## 2010-05-29 NOTE — Op Note (Signed)
[name], [name] NO.:  [id number]   MEDICAL RECORD NO.:  [id number]          PATIENT TYPE:  OIB   LOCATION:  3032                         FACILITY:  [facility]   PHYSICIAN:  Danae Orleans. Venetia Maxon, M.D.  DATE OF BIRTH:  1965/09/06   DATE OF PROCEDURE:  12/26/2006  DATE OF DISCHARGE:  12/27/2006                               OPERATIVE REPORT   PREOPERATIVE DIAGNOSIS:  Far lateral disk herniation, L5-S1 left, with  spondylosis, lumbar degenerative disk disease, and lumbar radiculopathy.   POSTOPERATIVE DIAGNOSIS:  Far lateral disk herniation, L5-S1 left, with  spondylosis, lumbar degenerative disk disease, and lumbar radiculopathy.   PROCEDURE:  Far lateral microdiskectomy, L5-S1 left, with  microdissection.   SURGEON:  Danae Orleans. Venetia Maxon, M.D.   ASSISTANT:  Hilda Lias, M.D. and Georgiann Cocker, R.N.   ANESTHESIA:  General endotracheal anesthesia.   ESTIMATED BLOOD LOSS:  Minimal.   COMPLICATIONS:  None.   DISPOSITION:  Recovery.   INDICATIONS:  [name] is a 45 year old woman who had previously  undergone microdiskectomy for removal of a free fragment.  She did well  postoperatively but then developed excruciating left leg pain.  An MRI  was obtained which demonstrated a new disk herniation far laterally on  the left at the L5-S1 level.  It was elected to take her to surgery for  far lateral microdiskectomy.   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  Following  satisfactory and uncomplicated induction of general endotracheal  anesthesia and placement of intravenous lines, the patient was placed in  the prone position on the Wilson frame.  Her low back was then prepped  and draped in the usual sterile fashion.  The area of planned incision  was infiltrated with 0.25% Marcaine and 0.5% lidocaine with 1:200,000  epinephrine.   Her previous incision was reopened and carried to the old laminotomy  defect, and a marker probe was placed to  confirm correct orientation.  Subsequently, more lateral exposure was performed to the pars of L5 and  the transverse process of L5 and the lateral facet joint complex of L5-  S1.  The microscope was brought into the field, and using  microdissection technique, a limited removal of the lateral pars and  superolateral facet was then performed.  The intertransverse muscle was  then carefully dissected, and the L5 nerve root was identified which was  tented up significantly cephalad by a great deal of herniated disk  material.  By using microhooks, there was painstaking microdissection.  The extremely large disk herniation was mobilized.  Eventually, a very  large fragment of herniated disk material was removed with resultant  significant decompression of the nerve root.  There was additional disk  material which was removed as well with a variety of Kerrison rongeurs  and micropituitary rongeurs.  The nerve root was felt to be  significantly decompressed.  Hemostasis was assured with Gelfoam soaked  in thrombin.  The wound was irrigated, and then Depo-Medrol and fentanyl  were placed in the operative bed.  The microscope was taken out of the  field.  The lumbodorsal fascia was closed with 0 Vicryl sutures.  The  subcutaneous tissues were approximated with 2-0 Vicryl interrupted  inverted sutures, and the skin edges were reapproximated with  interrupted 3-0 Vicryl subcuticular stitch.  The wound was dressed with  Dermabond.   The patient was extubated in the operating room and taken to the  recovery room in stable and satisfactory condition, having tolerated her  operation well.  Counts were correct at the end of the case.      Danae Orleans. Venetia Maxon, M.D.  Electronically Signed     JDS/MEDQ  D:  12/26/2006  T:  12/28/2006  Job:  [id number]

## 2010-05-29 NOTE — Op Note (Signed)
NAMEBRYLEY, CHRISMAN NO.:  [id number]   MEDICAL RECORD NO.:  [id number]          PATIENT TYPE:  INP   LOCATION:  3025                         FACILITY:  [facility]   PHYSICIAN:  Balinda Quails, M.D.    DATE OF BIRTH:  05-19-1965   DATE OF PROCEDURE:  05/18/2007  DATE OF DISCHARGE:                               OPERATIVE REPORT   SURGEON:  Balinda Quails, MD   CO-SURGEONDanae Orleans. Venetia Maxon, MD   ANESTHETIC:  General endotracheal.   PREOPERATIVE DIAGNOSIS:  L5-S1 degenerative disk disease.   POSTOPERATIVE DIAGNOSIS:  L5-S1 degenerative disk disease.   PROCEDURE:  L5-S1 anterior lumbar interbody fusion (ALIF).   CLINICAL NOTE:  [name] is a  45 year old female with chronic back  pain and multiple previous back translumbar operative procedures.   Today, she was undergoing a TLIF by Dr. Venetia Maxon, and he developed  difficulties with the procedure.   I was consulted intraoperatively and asked to assist with an L5-S1 ALIF.   OPERATIVE PROCEDURE:  The patient placed in the supine position.  The  abdomen was prepped and draped in a sterile fashion.  Transverse skin  incision made in the left lower quadrant at the projection of L5-S1.  Incision extended deeply through the subcutaneous tissue.  The anterior  rectus sheath was incised from midline and the lateral margin of left  rectus muscle.  Rectus muscle mobilized medially.  The retroperitoneal  plane entered.  The peritoneum pushed off the posterior rectus sheath,  and the posterior rectus sheath incised longitudinally.  The peritoneal  contents rotated anteriorly.  The left ureter rotated, mobilized, and  retracted with the peritoneal contents.  The L5-S1 disk was palpated.  There were some ecchymoses and bleeding evident.  There was an  intervertebral spacer, which was retrieved.  There was a small  laceration of left common iliac vein.  This was controlled with Gelfoam  packing.   The L5-S1 disk was palpated.   The presacral vessels were controlled with  clips and divided.  The disk was cleared from left-to-right.   Using a [employer] retractor reverse-lip blades were placed on the  lateral margins of L5 and S1 to expose the L5-S1 disk.  Malleable  retractors placed inferiorly and superiorly.   Dr. Venetia Maxon then completed L5-S1 ALIF.   At the completion of ALIF, the retractors were removed.  The bleeding  from the left common iliac vein was controlled easily with Gelfoam and  no sutures were used.   The peritoneal contents returned to its anatomical position.   The anterior rectus sheath was then closed with running 0 Vicryl suture.  The subcutaneous tissue closed with 2-0 Vicryl suture, and skin closed  with 4-0 Monocryl.  Sterile dressing applied.   OPERATIVE FINDINGS:  Retrieval of the interbody spacer and a small  laceration of left common iliac vein was controlled easily with  pressure.      Balinda Quails, M.D.  Electronically Signed     PGH/MEDQ  D:  05/19/2007  T:  05/20/2007  Job:  [id number]

## 2010-05-29 NOTE — Op Note (Signed)
NAMELYANNE, KATES NO.:  [id number]   MEDICAL RECORD NO.:  [id number]          PATIENT TYPE:  OIB   LOCATION:  3037                         FACILITY:  [facility]   PHYSICIAN:  Danae Orleans. Venetia Maxon, M.D.  DATE OF BIRTH:  Jun 01, 1965   DATE OF PROCEDURE:  11/07/2006  DATE OF DISCHARGE:                               OPERATIVE REPORT   PREOPERATIVE DIAGNOSIS:  Herniated lumbar disk L5-S1, left, with  spondylosis, degenerative disk disease, and radiculopathy.   POSTOPERATIVE DIAGNOSIS:  Herniated lumbar disk L5-S1, left, with  spondylosis, degenerative disk disease, and radiculopathy.   PROCEDURE:  Left L5-S1 microdiskectomy with microdissection.   SURGEON:  Danae Orleans. Venetia Maxon, M.D.   ASSISTANTS:  1. Georgiann Cocker, R.N.  2. Coletta Memos, M.D.   ANESTHESIA:  General endotracheal anesthesia.   ESTIMATED BLOOD LOSS:  Minimal.   COMPLICATIONS:  None.   DISPOSITION:  To recovery.   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  She had significant disk herniation with free  fragment causing significant left L5 nerve root compression.  It was  elected to take her to surgery for microdiskectomy.   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  After the  satisfactory noncomplicated induction of general endotracheal anesthesia  and placement of intravenous lines, the patient was placed in the prone  position on the Wilson frame.  Her low back was then prepped and draped  in the usual sterile fashion.  Area of planned incision was infiltrated  with 0.25% Marcaine and 0.5% lidocaine 1:200,000 epinephrine.  Incision  was made in the midline and carried through copious adipose tissue  through the lumbodorsal fascia, incised in the left side of midline.  Subperiosteal dissection was performed exposing the L5-S1 interspace and  the intraoperative x-ray confirmed correct orientation.  Subsequently,  semihemilaminectomy of L5  was performed with the high speed drill.  This  was carried high up on the L5 lamina to gain exposure of the L5 nerve  root.  The bone removal was completed with Kerrison rongeurs and the  ligamentum flavum was detached and removed in a piecemeal fashion,  exposing the spinal cord dura, next exposing the thecal sac, the S1  nerve root and the L5 nerve root.  Using microdissection technique, the  thecal sac was mobilized medially and multiple large fragments of  herniated disk material were removed from within the spinal canal  causing compression of the L5 nerve root.  Additionally, using a variety  of ball tip probes, the space underlying the L5 nerve root was probed  and some additional disk material was removed, resulting in what was  felt to be adequate decompression of the L5 nerve root as it extended  out the neural foramen.  It was elected not to enter the disk space  itself because it did not appear to be a severe nerve root compression  related to the bulging disk at this level.  He was assured and the wound  was irrigated.  All neural structures appeared to be well decompressed.  The wound was bathed in fentanyl and Depo-Medrol.  The self-retaining  retractor was removed.  The lumbodorsal fascia was closed with 0 Vicryl  suture.  Subcutaneous tissue was reapproximated with 2-0 Vicryl  interrupted, inverted sutures and the wound was dressed with Dermabond.  The patient was extubated in the operating room and taken to the  recovery room in stable, satisfactory condition, having tolerated the  operation well.  All counts were correct at the end of the case.      Danae Orleans. Venetia Maxon, M.D.  Electronically Signed     JDS/MEDQ  D:  11/07/2006  T:  11/07/2006  Job:  [id number]

## 2010-06-01 NOTE — Discharge Summary (Signed)
NAMESHARDEA, CWYNAR NO.:  [id number]   MEDICAL RECORD NO.:  [id number]          PATIENT TYPE:  INP   LOCATION:  3025                         FACILITY:  [facility]   PHYSICIAN:  Danae Orleans. Venetia Maxon, M.D.  DATE OF BIRTH:  02-04-1965   DATE OF ADMISSION:  05/18/2007  DATE OF DISCHARGE:  05/22/2007                               DISCHARGE SUMMARY   REASON FOR ADMISSION:  1. Lumbar disk displacement.  2. Lumbar disk degeneration.  3. Lumbosacral spondylosis.  4. Accidental operative laceration, acute emergent surgery.  5. Other mechanical complications with internal orthopedic device.   FINAL DIAGNOSES:  1. Lumbar disk displacement.  2. Lumbar disk degeneration.  3. Lumbosacral spondylosis.  4. Accidental operative laceration, acute emergent surgery.  5. Other mechanical complications with internal orthopedic device.   DISCHARGE DIAGNOSES:  1. Lumbar disk displacement.  2. Lumbar disk degeneration.  3. Lumbosacral spondylosis.  4. Accidental operative laceration, acute emergent surgery.  5. Other mechanical complications with internal orthopedic device.   HISTORY OF ILLNESS AND HOSPITAL COURSE:  Ms. [name] is a 45-year-  old woman with recurrence of disk herniation at L5-S1.  She has had  previous lumbar laminectomy.  It was elected to take her to surgery for  redo discectomy, and the plan was to perform a transforaminal lumbar  interbody fusion at L5-S1 level and posterolateral arthrodesis.  Surgery  was complicated by the fact that the cage went across the anterior  ligament and became dislodged into the retroperitoneum.  This required  Dr. Madilyn Fireman coming in from Vascular Surgery, and we closed the surgery  after thorough discectomy and posterolateral arthrodesis, turned her  supine and then performed an anterior lumbar decompression, removal of  the cage, and anterior lumbar interbody graft with combined plate or  combined anterior lumbar plating.  The patient  did well after surgery,  had good hematocrit, was gradually mobilized and had improvement in back  and leg pain.  She was doing well on June 22, 2007, and was discharged  home with  Flexeril every 8 hours as needed for muscular spasm, Percocet 10/325  half to one every 6 hours as needed for pain, and Ambien 10 mg at night  as needed for insomnia.   INSTRUCTIONS:  Followup in the office in 3 weeks postoperatively.      Danae Orleans. Venetia Maxon, M.D.  Electronically Signed     JDS/MEDQ  D:  07/02/2007  T:  07/03/2007  Job:  [id number]

## 2010-06-01 NOTE — Discharge Summary (Signed)
NAMECRISTALLE, [name]                          ACCOUNT NO.:  [id number]   MEDICAL RECORD NO.:  [id number]                   PATIENT TYPE:  INP   LOCATION:  0446                                 FACILITY:  [facility]   PHYSICIAN:  Thornton Park. Daphine Deutscher, M.D.             DATE OF BIRTH:  August 22, 1965   DATE OF ADMISSION:  03/28/2003  DATE OF DISCHARGE:  03/31/2003                                 DISCHARGE SUMMARY   ADMISSION DIAGNOSIS:  Morbid obesity.   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  Postoperatively, she did well.  She had a swallow on day one which looked  fine and she was started on liquids. She took this well and was ready for  discharge on March 31, 2003.   DISCHARGE MEDICATIONS:  1. Roxicet elixir to take for pain.   DISCHARGE CONDITION:  Good.   FOLLOW UP:  She is to return within the week.                                               Thornton Park Daphine Deutscher, M.D.    MBM/MEDQ  D:  04/18/2003  T:  04/19/2003  Job:  [id number]

## 2010-06-01 NOTE — Op Note (Signed)
[name], [name]                          ACCOUNT NO.:  [id number]   MEDICAL RECORD NO.:  [id number]                   PATIENT TYPE:  INP   LOCATION:  0001                                 FACILITY:  [facility]   PHYSICIAN:  Thornton Park. Daphine Deutscher, M.D.             DATE OF BIRTH:  Apr 22, 1965   DATE OF PROCEDURE:  03/28/2003  DATE OF DISCHARGE:                                 OPERATIVE REPORT   PREOPERATIVE DIAGNOSIS:  Morbid obesity with multiple comorbidities and BMI  approximately 42.   POSTOPERATIVE DIAGNOSIS:  Morbid obesity with multiple comorbidities and BMI  approximately 42.   PROCEDURE:  Roux-en-Y gastric bypass with approximately 5 cm pouch, 100 cm  Roux limb, 40 cm biliopancreatic limb.   SURGEON:  Luretha Murphy, M.D.   ASSISTANT:  Danna Hefty, M.D.   ANESTHESIA:  General endotracheal.   DESCRIPTION OF PROCEDURE:  [name] was taken to room 1 after she was  checked in the holding area.  No further questions regarding the procedure  were discussed.  She has been otherwise healthy.  She has been healthy up  until today and is ready for the procedure.  Lungs are clear.  After general  anesthesia was administered, the abdomen was prepped widely with Betadine  and draped sterilely.  PAS hose were in place, and a Foley catheter was  inserted.  The patient had been given preop antibiotics and preop Lovenox.  Using the OptiView trocar technique in the left subcostal area, the 12 mm  port was placed without difficulty, and the abdomen was insufflated.  With  the camera in place, we surveyed the abdomen and found there were no  noticeable adhesions, particularly down where she had had her previous  appendectomy.   Two upper, slightly to the right and midline trocars were placed, the 10-11  down below the umbilicus with the camera were used, and ultimately a 5 mm  upper midline was used for the Surveyor, mining.   First, we identified the ligament of Treitz, and  then I measured 40 cm down  below that and divided that with the GIA.  This took me in to the mesentery  which I opened up a bit.  I then put a suture and Penrose drain on the Roux  limb end of this and then sutured that in place and then measured 100 cm  upstream.  That gave [place] a point that we used for sewing to the distal end of  the fellow pancreatic limb.  This suture was placed and tied, and then I ran  backward along the Roux limb to identify the ligament of Treitz again and  then lined up and performed the jejunojejunostomy.  Openings were made along  the antimesenteric border, and then the GIA was inserted, clamped for  approximately 30 seconds and fired.  The stay suture was removed, and then  this defect was then closed with a  running 2-0 Vicryl from either end  beginning in the crotch, sewing upward and then sewing downward and tying in  the middle.  Tisseal was placed on this closure.  Because there was a little  tip of biliopancreatic down below that, I went ahead and pulled the Roux  limb up and then tacked the mesentery closed with the silk suture using  Laparo ties.  This seemed to approximate and close the mesenteric defect.   An antecolic anastomosis was felt to be done without tension as we were able  to pull this up to the liver without difficulty.  We then let go of that and  went up and began working in the upper abdomen.   The Nathanson retractor was inserted without difficulty, and we retracted  the liver and got good visibility there.  I measured down about 4-5 cm on  the lesser curvature and then did a retrogastric dissection and was able to  make a nice headway posteriorly, finally entering into the lesser sac.  All  the tubes were withdrawn from the stomach.  I made my initial firing  transversely across the stomach, and then the Ewald tube was placed.  Approximately three more firings were used to go up to the cardia which I  had previously dissected with the  finger dissector as well as with the hook  cautery.  This fired and completely divided the stomach.  There were some  little squirters on the residual side which I clipped with long hemoclips.  Tisseal was placed in the upper reaches of the gastric pouch as well as  along the gastric remnant.   Next, the Roux limb was brought up, and a suture was placed and tied along  the antimesenteric border of the Roux limb, placed with the candy-cane going  toward the right, and this was a back row on the antimesenteric border and  along the staple line of the stomach.  When this was completed, I put a  Laparo tie on the stomach end.  The distal side was tied, and the right side  was Laparo tied.  I then passed the Ewald tube and used the harmonic to get  into the stomach and then into the small intestine Roux limb.  The GIA was  then inserted and the Ewald pulled back, and then it was clamped and fired.  We visualized the anastomosis, and no bleeding was noted.  The common defect  was enclosed with 2-0 Vicryl from below and above, tying in the middle.  [name] was then passed across the anastomosis and a second layer using a 2-0  Vicryl on either end as it were to induct the serosa of the small intestine  up over and fix it to the serosa of the stomach.  The Ewald after this was  placed.  The [name] was withdrawn.  The patient was scoped with her flat.  Dr. Luan Pulling will dictate this separately, but basically with insufflation  and clamping distally, we got good insufflation of the pouch, and there was  no evidence of leak or bubbles.  The scope was withdrawn, and the Tisseal  was applied along the anterior portion of the gastric jejunojejunostomy  anastomosis.  Port sites were injected with 0.5% Marcaine.  I brought in a  10 Blake drain, brought it out through the left 5 mm port, and secured it to the skin with 3-0 nylon, but it was placed beneath the liver up near the  gastrojejunostomy.  Everything  else looked to be in good order.  The abdomen  was deflated.  Port sites were withdrawn.  The skin was closed with 4-0  Vicryl subcuticularly, and clips were placed to remain a couple of days in  the skin.  The patient seemed to tolerate the procedure well and was taken  to the recovery room in satisfactory condition.                                               Thornton Park Daphine Deutscher, M.D.    MBM/MEDQ  D:  03/28/2003  T:  03/28/2003  Job:  [id number]   cc:   Marne A. Milinda Antis, M.D. [facility]

## 2010-06-01 NOTE — Op Note (Signed)
NAMEAKAISHA, [name]                          ACCOUNT NO.:  [id number]   MEDICAL RECORD NO.:  [id number]                   PATIENT TYPE:  INP   LOCATION:  0446                                 [facility]ACILITY:  [facility]   PHYSICIAN:  Vikki Ports, M.D.         DATE OF BIRTH:  02-10-1965   DATE OF PROCEDURE:  03/28/2003  DATE OF DISCHARGE:                                 OPERATIVE REPORT   PREOPERATIVE DIAGNOSIS:  Morbid obesity.   POSTOPERATIVE DIAGNOSIS:  Morbid obesity.   PROCEDURE:  Upper endoscopy.   SURGEON:  Danna Hefty, M.D.   DESCRIPTION OF PROCEDURE:  After laparoscopic Roux-en-Y gastric bypass had  been completed by Dr. Luretha Murphy, I went to the head of the table and  inserted the Olympus endoscope.  This passed under direct vision down past  the Z-line.  Z-line was visualized, and the pouch was easily distended.  The  Roux limb had been occluded by Dr. Daphine Deutscher.  The upper abdomen was irrigated  with saline.  No leak was evidenced.  The pouch appeared intact; anastomosis  was patent.  The length of the pouch measured 5 cm.  Air was aspirated, and  the Olympus endoscope was withdrawn.                                               Vikki Ports, M.D.    KRH/MEDQ  D:  03/28/2003  T:  03/28/2003  Job:  [id number]

## 2010-06-05 ENCOUNTER — Telehealth: Payer: Self-pay | Admitting: Family Medicine

## 2010-06-05 MED ORDER — CYCLOBENZAPRINE HCL 10 MG PO TABS: 10.0000 mg | ORAL_TABLET | Freq: Three times a day (TID) | ORAL | Status: DC | PRN

## 2010-06-05 NOTE — Telephone Encounter (Signed)
Needs refil of flexeril -- sent to her pharmacy... For back and leg pain Will update me if symptoms worsen

## 2010-10-22 LAB — BASIC METABOLIC PANEL
BUN: 12
CO2: 24
Calcium: 8.8
Chloride: 104
Creatinine, Ser: 0.71
Potassium: 4
Sodium: 135

## 2010-10-22 LAB — CBC
Hemoglobin: 13
MCV: 95.3
Platelets: 370
RBC: 3.92
WBC: 7.5

## 2010-10-24 LAB — CBC
HCT: 39.6
MCHC: 34
MCV: 97.2
RBC: 4.08
WBC: 13.1 — ABNORMAL HIGH

## 2010-10-25 ENCOUNTER — Other Ambulatory Visit: Payer: Self-pay | Admitting: Obstetrics and Gynecology

## 2010-10-25 DIAGNOSIS — Z1231 Encounter for screening mammogram for malignant neoplasm of breast: Secondary | ICD-10-CM

## 2010-11-05 ENCOUNTER — Telehealth: Payer: Self-pay | Admitting: Family Medicine

## 2010-11-05 MED ORDER — AZITHROMYCIN 250 MG PO TABS: 250.0000 mg | ORAL_TABLET | Freq: Every day | ORAL | Status: AC

## 2010-11-05 NOTE — Telephone Encounter (Signed)
Pt called to report worsening URI now w/ chest congestion and wheezing.  Used albuterol overnight which caused palpitations.  Denies fevers.  Cough is dry and nonproductive.  Using OTC cough and cold products w/out relief.  Will send Zpack.

## 2010-11-07 ENCOUNTER — Telehealth: Payer: Self-pay | Admitting: Family Medicine

## 2010-11-07 MED ORDER — PREDNISONE 20 MG PO TABS: ORAL_TABLET | ORAL | Status: DC

## 2010-11-07 NOTE — Telephone Encounter (Signed)
Pt is having wheezing and deep cough despite using inhalers and taking Zpack.  Will start prednisone to improve wheezing.

## 2010-11-28 ENCOUNTER — Ambulatory Visit
Admission: RE | Admit: 2010-11-28 | Discharge: 2010-11-28 | Disposition: A | Discharge status: Home / Self Care | Payer: 59 | Source: Ambulatory Visit | Attending: Obstetrics and Gynecology | Admitting: Obstetrics and Gynecology

## 2010-11-28 DIAGNOSIS — Z1231 Encounter for screening mammogram for malignant neoplasm of breast: Secondary | ICD-10-CM

## 2010-12-21 ENCOUNTER — Telehealth: Payer: Self-pay | Admitting: Family Medicine

## 2010-12-21 MED ORDER — PREDNISONE 20 MG PO TABS: ORAL_TABLET | ORAL | Status: DC

## 2010-12-21 NOTE — Telephone Encounter (Signed)
Pt w/ contact dermatitis on arms, neck, chest and face.  Very itchy.  Requiring prednisone.  Med sent.

## 2011-01-21 ENCOUNTER — Telehealth: Payer: Self-pay | Admitting: Family Medicine

## 2011-01-21 MED ORDER — HYDROCOD POLST-CHLORPHEN POLST 10-8 MG/5ML PO LQCR: 5.0000 mL | Freq: Two times a day (BID) | ORAL | Status: DC | PRN

## 2011-01-21 MED ORDER — CLARITHROMYCIN ER 500 MG PO TB24: 1000.0000 mg | ORAL_TABLET | Freq: Every day | ORAL | Status: AC

## 2011-01-21 NOTE — Telephone Encounter (Signed)
Pt w/ deep, hacking cough.  + rattling in chest.  Using inhalers w/out relief.  + sick contacts.  Start Biaxin to cover for possible atypicals.  Cough meds prn.

## 2011-02-05 ENCOUNTER — Encounter: Payer: Self-pay | Admitting: Internal Medicine

## 2011-02-06 ENCOUNTER — Telehealth: Payer: Self-pay | Admitting: Family Medicine

## 2011-02-06 MED ORDER — CYCLOBENZAPRINE HCL 10 MG PO TABS: 10.0000 mg | ORAL_TABLET | Freq: Three times a day (TID) | ORAL | Status: AC | PRN

## 2011-02-06 NOTE — Telephone Encounter (Signed)
Pt seeing chiropractic.  Has large trap spasm.  Asking for flexeril.  Script sent.

## 2011-07-08 ENCOUNTER — Other Ambulatory Visit: Payer: Self-pay | Admitting: Family Medicine

## 2011-07-08 DIAGNOSIS — Q670 Congenital facial asymmetry: Secondary | ICD-10-CM

## 2011-07-10 ENCOUNTER — Ambulatory Visit (INDEPENDENT_AMBULATORY_CARE_PROVIDER_SITE_OTHER)
Admission: RE | Admit: 2011-07-10 | Discharge: 2011-07-10 | Disposition: A | Discharge status: Home / Self Care | Payer: 59 | Source: Ambulatory Visit | Attending: Family Medicine | Admitting: Family Medicine

## 2011-07-10 ENCOUNTER — Telehealth: Payer: Self-pay | Admitting: *Deleted

## 2011-07-10 DIAGNOSIS — Q670 Congenital facial asymmetry: Secondary | ICD-10-CM

## 2011-07-10 DIAGNOSIS — Q674 Other congenital deformities of skull, face and jaw: Secondary | ICD-10-CM

## 2011-07-10 NOTE — Telephone Encounter (Signed)
Received call from pt noting requesting recent CT Scan to be faxed to MD Zaldivar [phone], faxed manually with confirmation page attached.

## 2011-10-29 ENCOUNTER — Other Ambulatory Visit: Payer: Self-pay | Admitting: Obstetrics and Gynecology

## 2011-10-29 DIAGNOSIS — Z1231 Encounter for screening mammogram for malignant neoplasm of breast: Secondary | ICD-10-CM

## 2011-12-04 ENCOUNTER — Ambulatory Visit
Admission: RE | Admit: 2011-12-04 | Discharge: 2011-12-04 | Disposition: A | Discharge status: Home / Self Care | Payer: 59 | Source: Ambulatory Visit | Attending: Obstetrics and Gynecology | Admitting: Obstetrics and Gynecology

## 2011-12-04 DIAGNOSIS — Z1231 Encounter for screening mammogram for malignant neoplasm of breast: Secondary | ICD-10-CM

## 2012-02-19 ENCOUNTER — Other Ambulatory Visit: Payer: Self-pay | Admitting: Obstetrics and Gynecology

## 2012-02-26 ENCOUNTER — Other Ambulatory Visit: Payer: Self-pay | Admitting: Family Medicine

## 2012-02-26 MED ORDER — DROSPIREN-ETH ESTRAD-LEVOMEFOL 3-0.02-0.451 MG PO TABS: 1.0000 | ORAL_TABLET | Freq: Every day | ORAL | Status: DC

## 2012-02-26 MED ORDER — TRAMADOL HCL 50 MG PO TABS: 50.0000 mg | ORAL_TABLET | Freq: Three times a day (TID) | ORAL | Status: DC | PRN

## 2012-03-02 ENCOUNTER — Other Ambulatory Visit (HOSPITAL_COMMUNITY): Payer: Self-pay | Admitting: Orthopedic Surgery

## 2012-03-02 DIAGNOSIS — M25561 Pain in right knee: Secondary | ICD-10-CM

## 2012-03-04 ENCOUNTER — Ambulatory Visit (HOSPITAL_COMMUNITY)
Admission: RE | Admit: 2012-03-04 | Discharge: 2012-03-04 | Disposition: A | Discharge status: Home / Self Care | Payer: 59 | Source: Ambulatory Visit | Attending: Orthopedic Surgery | Admitting: Orthopedic Surgery

## 2012-03-04 DIAGNOSIS — IMO0002 Reserved for concepts with insufficient information to code with codable children: Secondary | ICD-10-CM | POA: Insufficient documentation

## 2012-03-04 DIAGNOSIS — M25561 Pain in right knee: Secondary | ICD-10-CM

## 2012-03-04 DIAGNOSIS — M25569 Pain in unspecified knee: Secondary | ICD-10-CM | POA: Insufficient documentation

## 2012-03-04 DIAGNOSIS — M1710 Unilateral primary osteoarthritis, unspecified knee: Secondary | ICD-10-CM | POA: Insufficient documentation

## 2012-03-08 ENCOUNTER — Other Ambulatory Visit: Payer: Self-pay | Admitting: Family Medicine

## 2012-03-08 MED ORDER — AZITHROMYCIN 250 MG PO TABS: ORAL_TABLET | ORAL | Status: DC

## 2012-03-08 MED ORDER — PROMETHAZINE-DM 6.25-15 MG/5ML PO SYRP: 5.0000 mL | ORAL_SOLUTION | Freq: Four times a day (QID) | ORAL | Status: DC | PRN

## 2012-04-09 ENCOUNTER — Other Ambulatory Visit: Payer: Self-pay | Admitting: Family Medicine

## 2012-04-09 LAB — CBC WITH DIFFERENTIAL/PLATELET
Basophils Absolute: 0 10*3/uL (ref 0.0–0.1)
Eosinophils Absolute: 0.1 10*3/uL (ref 0.0–0.7)
HCT: 35.1 % — ABNORMAL LOW (ref 36.0–46.0)
Hemoglobin: 11.7 g/dL — ABNORMAL LOW (ref 12.0–15.0)
Lymphs Abs: 2.6 10*3/uL (ref 0.7–4.0)
MCHC: 33.3 g/dL (ref 30.0–36.0)
MCV: 90.3 fl (ref 78.0–100.0)
Monocytes Absolute: 0.7 10*3/uL (ref 0.1–1.0)
Neutro Abs: 5.4 10*3/uL (ref 1.4–7.7)
Platelets: 361 10*3/uL (ref 150.0–400.0)
RDW: 14.7 % — ABNORMAL HIGH (ref 11.5–14.6)

## 2012-04-09 LAB — LDL CHOLESTEROL, DIRECT: Direct LDL: 164.6 mg/dL

## 2012-04-09 LAB — LIPID PANEL
HDL: 88 mg/dL (ref >39.00)
Total CHOL/HDL Ratio: 3
VLDL: 23.2 mg/dL (ref 0.0–40.0)

## 2012-04-09 LAB — COMPREHENSIVE METABOLIC PANEL
ALT: 24 U/L (ref 0–35)
AST: 37 U/L (ref 0–37)
Alkaline Phosphatase: 48 U/L (ref 39–117)
CO2: 20 mEq/L (ref 19–32)
Creatinine, Ser: 0.8 mg/dL (ref 0.4–1.2)
GFR: 77.61 mL/min (ref >60.00)
Total Bilirubin: 0.5 mg/dL (ref 0.3–1.2)

## 2012-05-01 ENCOUNTER — Other Ambulatory Visit: Payer: Self-pay | Admitting: Internal Medicine

## 2012-05-01 DIAGNOSIS — S46812A Strain of other muscles, fascia and tendons at shoulder and upper arm level, left arm, initial encounter: Secondary | ICD-10-CM

## 2012-05-01 MED ORDER — DICLOFENAC SODIUM 75 MG PO TBEC: 75.0000 mg | DELAYED_RELEASE_TABLET | Freq: Two times a day (BID) | ORAL | Status: DC

## 2012-05-01 MED ORDER — CYCLOBENZAPRINE HCL 5 MG PO TABS: ORAL_TABLET | ORAL | Status: DC

## 2012-05-19 ENCOUNTER — Ambulatory Visit (HOSPITAL_BASED_OUTPATIENT_CLINIC_OR_DEPARTMENT_OTHER)
Admission: RE | Admit: 2012-05-19 | Discharge: 2012-05-19 | Disposition: A | Discharge status: Home / Self Care | Payer: 59 | Source: Ambulatory Visit | Attending: Family Medicine | Admitting: Family Medicine

## 2012-05-19 ENCOUNTER — Other Ambulatory Visit: Payer: Self-pay | Admitting: Family Medicine

## 2012-05-19 DIAGNOSIS — M79605 Pain in left leg: Secondary | ICD-10-CM

## 2012-05-19 DIAGNOSIS — I82819 Embolism and thrombosis of superficial veins of unspecified lower extremities: Secondary | ICD-10-CM | POA: Insufficient documentation

## 2012-05-19 DIAGNOSIS — M79609 Pain in unspecified limb: Secondary | ICD-10-CM | POA: Insufficient documentation

## 2012-05-19 MED ORDER — RIVAROXABAN 20 MG PO TABS: 20.0000 mg | ORAL_TABLET | Freq: Every day | ORAL | Status: DC

## 2012-05-19 MED ORDER — RIVAROXABAN 15 MG PO TABS: ORAL_TABLET | ORAL | Status: DC

## 2012-05-20 ENCOUNTER — Other Ambulatory Visit: Payer: 59

## 2012-05-20 DIAGNOSIS — I8290 Acute embolism and thrombosis of unspecified vein: Secondary | ICD-10-CM

## 2012-05-22 LAB — HYPERCOAGULABLE PANEL, COMPREHENSIVE RET.
Anticardiolipin IgG: 2 GPL U/mL (ref <23)
Anticardiolipin IgM: 4 MPL U/mL (ref <11)
DRVVT 1:1 Mix: 45.2 secs — ABNORMAL HIGH (ref <42.9)
DRVVT: 69.5 secs — ABNORMAL HIGH (ref <42.9)
Homocysteine: 11.6 umol/L (ref 4.0–15.4)
PTT Lupus Anticoagulant: 49.6 secs — ABNORMAL HIGH (ref 28.0–43.0)
Protein C Activity: 176 % — ABNORMAL HIGH (ref 75–133)

## 2012-05-26 ENCOUNTER — Other Ambulatory Visit: Payer: Self-pay | Admitting: Internal Medicine

## 2012-05-26 DIAGNOSIS — I809 Phlebitis and thrombophlebitis of unspecified site: Secondary | ICD-10-CM

## 2012-05-27 ENCOUNTER — Telehealth: Payer: Self-pay | Admitting: Hematology & Oncology

## 2012-05-27 NOTE — Telephone Encounter (Signed)
Pt aware of 5-15 appointment °

## 2012-05-28 ENCOUNTER — Ambulatory Visit: Payer: 59

## 2012-05-28 ENCOUNTER — Ambulatory Visit (HOSPITAL_BASED_OUTPATIENT_CLINIC_OR_DEPARTMENT_OTHER): Payer: 59 | Admitting: Hematology & Oncology

## 2012-05-28 VITALS — BP 146/77 | HR 86 | Temp 98.2°F | Resp 16 | Ht 65.0 in | Wt 247.0 lb

## 2012-05-28 DIAGNOSIS — I82712 Chronic embolism and thrombosis of superficial veins of left upper extremity: Secondary | ICD-10-CM

## 2012-05-28 DIAGNOSIS — I82819 Embolism and thrombosis of superficial veins of unspecified lower extremities: Secondary | ICD-10-CM

## 2012-05-28 NOTE — Progress Notes (Signed)
This office note has been dictated.

## 2012-05-29 ENCOUNTER — Telehealth: Payer: Self-pay | Admitting: Hematology & Oncology

## 2012-05-29 NOTE — Telephone Encounter (Signed)
Pt aware of 6-18 [place] and MD appointments

## 2012-05-29 NOTE — Progress Notes (Signed)
CC:   Willow Ora, MD Stann Mainland. [name], M.D.  DIAGNOSES: 1. Superficial venous thrombosis of the greater saphenous vein on the     left leg. 2. Possible lupus anticoagulant positivity.  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  She works for [employer].  She has been with them for about 10 years or so.  She has been very healthy.  She really has had no prior health problems.  She had gone to the beach, I think, a couple of weeks ago.  She had no problems there.  She was active.  She subsequently noted that she had developed some swelling and pain on the inner left thigh.  This happened about a week or so after she got back from the beach.  She sees Dr. Willow Ora.  Dr. Drue Novel went ahead and got a Doppler of the left leg.  Surprisingly enough, she was found to have a superficial thrombus in the greater saphenous vein.  She spoke to a couple of doctors in town.  It was recommended that she go on Xarelto.  She currently is on 15 mg p.o. b.i.d.  She did have a hypercoagulable panel done.  She was found to have a positive lupus anticoagulant.  She had a minimally elevated beta-2 glycoprotein IgA.  The rest of her hypercoagulable studies were okay.  Homocystine level was 11.6.  She had been on a low-estrogen birth control pill.  She sees Dr. [name]. Dr. [name] put her on a progesterone-only pill.  She was kindly referred to the [facility] so we could help manage this thromboembolic event.  She said that her father had a DVT after traveling from [place] up to [place].  Outside of that, there is no other history of DVT in the family.  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  She does not smoke.  She is not diabetic.  She has no hyperlipidemia issues.  There are no cardiac issues.  She does have her mammograms yearly.  PAST MEDICAL HISTORY:  Pretty much unremarkable.  ALLERGIES:  Allergies are to  the Floxin drugs.  MEDICATIONS: 1. Flexeril 5 mg as needed. 2. Xarelto 15 mg p.o. b.i.d. 3. Ultram 50 mg as needed. 4. Ortho-Micronor 0.35 mg daily.  SOCIAL HISTORY:  Her social history is negative for tobacco use.  There may be rare alcohol use.  She has no obvious occupational exposures.  FAMILY HISTORY:  Again, is noncontributory.  There was, I think, an aunt with pancreatic cancer.  Her grandfather had lung cancer.  REVIEW OF SYSTEMS:  As stated in History of Present Illness.  No additional findings are noted on a 12-system review.  PHYSICAL EXAMINATION:  General:  This is a well-developed, well- nourished white female, in no obvious distress.  Vital signs: Temperature of 98.3, pulse 86, respiratory rate 16, blood pressure 146/77.  Weight is 247.  Head and neck:  Normocephalic, atraumatic skull.  There are no ocular or oral lesions.  There are no palpable cervical or supraclavicular lymph nodes.  Lungs:  Clear to percussion and auscultation bilaterally.  Cardiac:  Regular rate and rhythm, with a normal S1 and S2.  There are no murmurs, rubs or bruits.  Abdomen: Soft, with good bowel sounds.  There is no palpable abdominal mass. There is no fluid wave.  There is no palpable hepatosplenomegaly. Extremities:  Show no clubbing, cyanosis or edema.  There is no palpable venous  cord in the left inner thigh.  She has good range of motion of her joints.  She has good pulses in her distal extremities.  Skin:  No rashes, ecchymoses, or petechia.  Neurological:  Shows no focal neurological deficits.  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  I definitely agree with the anticoagulation using Xarelto.  I think some recent studies do seem to suggest a greater risk of DVT in patients with a superficial vein thrombus.  I  would recommend anticoagulation for at least 3 months.  I would plan to repeat a Doppler of her left leg in about 2 months so we can see how things have resolved.  It seems like, clinically, things are improving for her as she does not have pain or redness in that area in the left inner thigh.  I want to repeat the lupus anticoagulant assay when we see her back.  If still positive, we will confirm this with the appropriate confirmatory test.  I do not think we need to go on any "search" for a malignancy.  I cannot find anything on her exam that would suggest an underlying malignancy.  She wondered if she could go back onto the estrogen pills.  I told her that we typically do not recommend that in patients who have had thromboembolic events.  However, this could be subject to change once we complete her therapeutic course of anticoagulation and get her on low- dose aspirin.  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  I have talked to her over the phone on several occasions.  We share a lot of patients together.    ______________________________ Josph Macho, M.D. PRE/MEDQ  D:  05/28/2012  T:  05/29/2012  Job:  [id number]

## 2012-06-01 ENCOUNTER — Encounter: Payer: Self-pay | Admitting: Family Medicine

## 2012-06-03 ENCOUNTER — Encounter: Payer: Self-pay | Admitting: Family Medicine

## 2012-06-03 ENCOUNTER — Ambulatory Visit (INDEPENDENT_AMBULATORY_CARE_PROVIDER_SITE_OTHER): Payer: 59 | Admitting: Family Medicine

## 2012-06-03 VITALS — BP 122/84 | HR 96 | Temp 98.9°F | Ht 65.0 in | Wt 245.5 lb

## 2012-06-03 DIAGNOSIS — R894 Abnormal immunological findings in specimens from other organs, systems and tissues: Secondary | ICD-10-CM

## 2012-06-03 DIAGNOSIS — I82819 Embolism and thrombosis of superficial veins of unspecified lower extremities: Secondary | ICD-10-CM | POA: Insufficient documentation

## 2012-06-03 DIAGNOSIS — R760 Raised antibody titer: Secondary | ICD-10-CM

## 2012-06-03 DIAGNOSIS — M542 Cervicalgia: Secondary | ICD-10-CM

## 2012-06-03 DIAGNOSIS — E785 Hyperlipidemia, unspecified: Secondary | ICD-10-CM

## 2012-06-03 MED ORDER — CYCLOBENZAPRINE HCL 10 MG PO TABS: 10.0000 mg | ORAL_TABLET | Freq: Three times a day (TID) | ORAL | Status: DC | PRN

## 2012-06-03 NOTE — Patient Instructions (Addendum)
I sent flexeril to [facility] Take care of yourself  Think about going to sport med for your knees

## 2012-06-03 NOTE — Progress Notes (Signed)
Subjective:    Patient ID: [name], female    DOB: Dec 09, 1965, 47 y.o.   MRN: [id number]  HPI Here for f/u superficial venous thrombus - and lupus anticoagulant pos   L leg - was painful - much better now   On xarleto- no problems with that - is going to be on 3 mo  Then transition to asa 81 mg  Will likely not go back to any estrogen Sees heme   On progesterone only OC now - doing ok so far   Just started weight watchers - went to one meeting - lost 5 lbs - happy wit that   Having knee issues - sees Dr Berton Lan- has had cortisone and synervisc  Also thinks she had a Bakers cyst that popped  Most of her problems are patellar  Has not had PT or orthotics  Needs refil of flexeril  Uses for neck pain at night and migraine  She does not use this often   Stopped her antidepressant and also ADD med for now  Lot of stress     Chemistry      Component Value Date/Time   NA 132* 04/09/2012 1042   K 3.9 04/09/2012 1042   CL 102 04/09/2012 1042   CO2 20 04/09/2012 1042   BUN 12 04/09/2012 1042   CREATININE 0.8 04/09/2012 1042      Component Value Date/Time   CALCIUM 8.2* 04/09/2012 1042   ALKPHOS 48 04/09/2012 1042   AST 37 04/09/2012 1042   ALT 24 04/09/2012 1042   BILITOT 0.5 04/09/2012 1042      Lab Results  Component Value Date   CHOL 264* 04/09/2012   HDL 88.00 04/09/2012   LDLDIRECT 164.6 04/09/2012   TRIG 116.0 04/09/2012   CHOLHDL 3 04/09/2012   watch diet   Goes to the gym - recumbant bike/ spin /and goes to see trainer   Patient Active Problem List   Diagnosis Date Noted  . Acute superficial venous thrombosis of lower extremity 06/03/2012  . Lupus anticoagulant positive 06/03/2012  . Neck pain 06/03/2012  . FOOT PAIN 08/02/2009  . ASTHMA, PERSISTENT 05/08/2009  . UNSPECIFIED ANEMIA 09/03/2007  . Other Malaise and Fatigue 09/03/2007  . HYPERLIPIDEMIA 11/04/2006  . ADD 11/04/2006  . ACNE NEC 11/04/2006  . BACK PAIN 11/04/2006   No past medical history on  file. No past surgical history on file. History  Substance Use Topics  . Smoking status: Never Smoker   . Smokeless tobacco: Not on file  . Alcohol Use: Yes     Comment: occ- wine   No family history on file. Allergies  Allergen Reactions  . Adhesive (Tape)   . Levofloxacin     REACTION: abdiminal pain   Current Outpatient Prescriptions on File Prior to Visit  Medication Sig Dispense Refill  . norethindrone (ORTHO MICRONOR) 0.35 MG tablet Take 1 tablet by mouth daily.      . Rivaroxaban (XARELTO) 15 MG TABS tablet 1 tab twice daily x21 days  42 tablet  0  . traMADol (ULTRAM) 50 MG tablet Take 1 tablet (50 mg total) by mouth every 8 (eight) hours as needed for pain.  30 tablet  6  . Rivaroxaban (XARELTO) 20 MG TABS Take 20 mg by mouth daily. DID NOT START YET  5-15-       No current facility-administered medications on file prior to visit.    Review of Systems Review of Systems  Constitutional: Negative for fever, appetite change,  fatigue and unexpected weight change.  Eyes: Negative for pain and visual disturbance.  Respiratory: Negative for cough and shortness of breath.   Cardiovascular: Negative for cp or palpitations    Gastrointestinal: Negative for nausea, diarrhea and constipation.  Genitourinary: Negative for urgency and frequency.  Skin: Negative for pallor or rash   MSK pos for R knee pain  Neurological: Negative for weakness, light-headedness, numbness and headaches.  Hematological: Negative for adenopathy. Does not bruise/bleed easily.  Psychiatric/Behavioral: Negative for dysphoric mood. The patient is not nervous/anxious.         Objective:   Physical Exam  Constitutional: She appears well-developed and well-nourished. No distress.  overwt and well appearing   HENT:  Head: Normocephalic.  Eyes: Conjunctivae and EOM are normal. Pupils are equal, round, and reactive to light. No scleral icterus.  Neck: Normal range of motion. Neck supple. No thyromegaly  present.  Cardiovascular: Normal rate and regular rhythm.   Pulmonary/Chest: Effort normal and breath sounds normal. No respiratory distress. She has no wheezes. She has no rales.  Musculoskeletal: She exhibits no edema.  L leg appears normal - spot of blood clot is improved per pt   Neurological: She is alert. She has normal reflexes. No cranial nerve deficit. She exhibits normal muscle tone. Coordination normal.  Skin: Skin is warm and dry.  Psychiatric: She has a normal mood and affect.          Assessment & Plan:

## 2012-06-04 NOTE — Assessment & Plan Note (Signed)
Disc goals for lipids and reasons to control them Rev labs with pt Rev low sat fat diet in detail  Her HDL is high- reassuring

## 2012-06-04 NOTE — Assessment & Plan Note (Signed)
Acute on chronic and this triggers migraines  Given px for flexeril to use prn with caution  Urged to keep exercising

## 2012-06-04 NOTE — Assessment & Plan Note (Signed)
Seeing hematology- will take xarleto for 3 mo and then transition to asa Will re check test to see if false pos  superfical thrombus is resolving

## 2012-06-15 ENCOUNTER — Telehealth: Payer: Self-pay | Admitting: Internal Medicine

## 2012-06-15 ENCOUNTER — Encounter: Payer: Self-pay | Admitting: Family Medicine

## 2012-06-15 MED ORDER — PREDNISONE (PAK) 10 MG PO TABS: 10.0000 mg | ORAL_TABLET | ORAL | Status: DC

## 2012-06-15 NOTE — Telephone Encounter (Signed)
Pt notes acute recurrent radicular pain  Working with chiropractic provider on same  tx with 6d pred pak as antiinflammatory  Pt agrees and will follow up if unimproved, sooner if worse

## 2012-07-01 ENCOUNTER — Ambulatory Visit (HOSPITAL_BASED_OUTPATIENT_CLINIC_OR_DEPARTMENT_OTHER): Payer: 59 | Admitting: Hematology & Oncology

## 2012-07-01 ENCOUNTER — Ambulatory Visit (HOSPITAL_BASED_OUTPATIENT_CLINIC_OR_DEPARTMENT_OTHER)
Admission: RE | Admit: 2012-07-01 | Discharge: 2012-07-01 | Disposition: A | Discharge status: Home / Self Care | Payer: 59 | Source: Ambulatory Visit | Attending: Hematology & Oncology | Admitting: Hematology & Oncology

## 2012-07-01 ENCOUNTER — Other Ambulatory Visit (HOSPITAL_BASED_OUTPATIENT_CLINIC_OR_DEPARTMENT_OTHER): Payer: 59 | Admitting: Lab

## 2012-07-01 VITALS — BP 147/88 | HR 65 | Temp 99.1°F | Resp 16 | Ht 65.0 in | Wt 249.0 lb

## 2012-07-01 DIAGNOSIS — I82812 Embolism and thrombosis of superficial veins of left lower extremities: Secondary | ICD-10-CM

## 2012-07-01 DIAGNOSIS — I82712 Chronic embolism and thrombosis of superficial veins of left upper extremity: Secondary | ICD-10-CM

## 2012-07-01 DIAGNOSIS — I82819 Embolism and thrombosis of superficial veins of unspecified lower extremities: Secondary | ICD-10-CM

## 2012-07-01 NOTE — Progress Notes (Signed)
This office note has been dictated.

## 2012-07-02 LAB — LUPUS ANTICOAGULANT PANEL: Lupus Anticoagulant: NOT DETECTED

## 2012-07-02 NOTE — Progress Notes (Signed)
CC:   Madeline Ora, MD [name]. [name], M.D.  DIAGNOSES: 1. Superficial vein thrombosis of the left greater saphenous vein,     2009. 2. Possible positive lupus anticoagulant.  CURRENT THERAPIES:  Xarelto 20 mg p.o. daily -- likely on 3 to 4 months.  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  She is still doing well. There is no pain in the left leg.  She is working without any difficulties.  She is getting ready to go to [place] in July.  She will be gone for I think a couple weeks in late July.  There is no problem with bleeding.  She has had no nausea or vomiting. There has been no arthralgias or myalgias.  We did go and repeat her Doppler today.  This did show an improvement in the superficial thrombus.  There is now thrombus only in the distal thigh, extending to the knee.  There is no extension of the clot into the calf muscle.  PHYSICAL EXAMINATION:  General:  This is a well-developed, well- nourished white female in no obvious distress.  Vital Signs: Temperature 99.1, pulse 84, respiratory rate 16, blood pressure 147/88, weight 249.  Head/Neck:  Normocephalic, atraumatic skull.  There are no ocular or oral lesions.  There are no palpable cervical or supraclavicular lymph nodes.  Lungs:  Clear bilaterally.  Cardiac: Regular rate and rhythm with a normal S1 and S2.  There are no murmurs, rubs or bruits.  Abdomen:  Soft, with good bowel sounds.  There is no palpable abdominal mass.  There is no fluid wave.  There is no palpable hepatosplenomegaly.  Extremities:  Show no clubbing, cyanosis or edema. There is no swelling of the left leg.  She has good range of motion of her joints.  No obvious venous cord is palpable in the left leg. Neurologic:  Shows no focal neurological deficits.  LABORATORY STUDIES:  Not done this visit.  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  Again, she is responding  by the recent Doppler test.  I think that we probably need to repeat her Doppler in another couple of months.  I will do that once she gets back from [place].  I will repeat the lupus anticoagulant study.  If she is clearly positive, then I will make sure that we get her on aspirin.   ______________________________ Josph Macho, M.D. PRE/MEDQ  D:  07/01/2012  T:  07/02/2012  Job:  [id number]

## 2012-07-06 ENCOUNTER — Telehealth: Payer: Self-pay | Admitting: *Deleted

## 2012-07-06 NOTE — Telephone Encounter (Addendum)
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  I suspect this this was a false positive when she first had the thrombus in her leg.  Pete ------This message left on pt's private cell phone.

## 2012-07-13 ENCOUNTER — Other Ambulatory Visit: Payer: 59 | Admitting: Lab

## 2012-07-13 ENCOUNTER — Other Ambulatory Visit (HOSPITAL_BASED_OUTPATIENT_CLINIC_OR_DEPARTMENT_OTHER): Payer: 59

## 2012-07-13 ENCOUNTER — Ambulatory Visit: Payer: 59 | Admitting: Hematology & Oncology

## 2012-08-20 ENCOUNTER — Encounter: Payer: Self-pay | Admitting: Hematology & Oncology

## 2012-08-24 ENCOUNTER — Other Ambulatory Visit: Payer: Self-pay

## 2012-08-24 DIAGNOSIS — I82812 Embolism and thrombosis of superficial veins of left lower extremities: Secondary | ICD-10-CM

## 2012-08-24 DIAGNOSIS — R894 Abnormal immunological findings in specimens from other organs, systems and tissues: Secondary | ICD-10-CM

## 2012-08-25 ENCOUNTER — Other Ambulatory Visit: Payer: 59

## 2012-08-25 DIAGNOSIS — I82812 Embolism and thrombosis of superficial veins of left lower extremities: Secondary | ICD-10-CM

## 2012-08-25 DIAGNOSIS — R894 Abnormal immunological findings in specimens from other organs, systems and tissues: Secondary | ICD-10-CM

## 2012-08-25 LAB — D-DIMER, QUANTITATIVE: D-Dimer, Quant: 0.27 ug/mL-FEU (ref 0.00–0.48)

## 2012-08-26 ENCOUNTER — Other Ambulatory Visit: Payer: 59 | Admitting: Lab

## 2012-08-26 ENCOUNTER — Ambulatory Visit (HOSPITAL_BASED_OUTPATIENT_CLINIC_OR_DEPARTMENT_OTHER): Payer: 59

## 2012-08-26 ENCOUNTER — Ambulatory Visit (HOSPITAL_BASED_OUTPATIENT_CLINIC_OR_DEPARTMENT_OTHER)
Admission: RE | Admit: 2012-08-26 | Discharge: 2012-08-26 | Disposition: A | Discharge status: Home / Self Care | Payer: 59 | Source: Ambulatory Visit | Attending: Hematology & Oncology | Admitting: Hematology & Oncology

## 2012-08-26 ENCOUNTER — Ambulatory Visit: Payer: 59 | Admitting: Hematology & Oncology

## 2012-08-26 DIAGNOSIS — I8000 Phlebitis and thrombophlebitis of superficial vessels of unspecified lower extremity: Secondary | ICD-10-CM | POA: Insufficient documentation

## 2012-08-26 DIAGNOSIS — I82812 Embolism and thrombosis of superficial veins of left lower extremities: Secondary | ICD-10-CM

## 2012-08-28 ENCOUNTER — Encounter: Payer: Self-pay | Admitting: Hematology & Oncology

## 2012-09-01 ENCOUNTER — Telehealth: Payer: Self-pay | Admitting: Hematology & Oncology

## 2012-09-01 NOTE — Telephone Encounter (Signed)
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  I told her she can stop the Xarelto and go onto ASA 162mg  po qd.  The Doppler shows some residual thrombophlebitis.  I think ASA would be satisfactory.  Call me with any problems.   Cindee Lame e

## 2012-10-14 ENCOUNTER — Other Ambulatory Visit: Payer: Self-pay | Admitting: Family Medicine

## 2012-10-14 NOTE — Telephone Encounter (Signed)
Last OV 06-03-12 Med filled 02-26-12 #30 with 6 refills

## 2012-10-14 NOTE — Telephone Encounter (Signed)
Med filled.  

## 2012-10-21 ENCOUNTER — Other Ambulatory Visit: Payer: Self-pay | Admitting: Family Medicine

## 2012-10-21 MED ORDER — ZOLPIDEM TARTRATE 5 MG PO TABS: 5.0000 mg | ORAL_TABLET | Freq: Every evening | ORAL | Status: DC | PRN

## 2012-11-10 ENCOUNTER — Other Ambulatory Visit: Payer: Self-pay

## 2012-11-10 DIAGNOSIS — Z1231 Encounter for screening mammogram for malignant neoplasm of breast: Secondary | ICD-10-CM

## 2012-12-09 ENCOUNTER — Ambulatory Visit: Payer: 59

## 2013-01-20 ENCOUNTER — Ambulatory Visit
Admission: RE | Admit: 2013-01-20 | Discharge: 2013-01-20 | Disposition: A | Discharge status: Home / Self Care | Payer: 59 | Source: Ambulatory Visit

## 2013-01-20 DIAGNOSIS — Z1231 Encounter for screening mammogram for malignant neoplasm of breast: Secondary | ICD-10-CM

## 2013-02-22 ENCOUNTER — Other Ambulatory Visit: Payer: Self-pay | Admitting: General Practice

## 2013-02-22 MED ORDER — NORETHINDRONE 0.35 MG PO TABS: 1.0000 | ORAL_TABLET | Freq: Every day | ORAL | Status: DC

## 2013-03-24 ENCOUNTER — Other Ambulatory Visit: Payer: Self-pay | Admitting: Obstetrics and Gynecology

## 2013-04-15 ENCOUNTER — Telehealth: Payer: Self-pay | Admitting: Family Medicine

## 2013-04-15 MED ORDER — SULFAMETHOXAZOLE-TMP DS 800-160 MG PO TABS
1.0000 | ORAL_TABLET | Freq: Two times a day (BID) | ORAL | Status: DC
Start: 2013-04-15 — End: 2013-07-30

## 2013-04-15 MED ORDER — PROMETHAZINE-DM 6.25-15 MG/5ML PO SYRP: 5.0000 mL | ORAL_SOLUTION | Freq: Four times a day (QID) | ORAL | Status: DC | PRN

## 2013-04-15 NOTE — Telephone Encounter (Signed)
Pt w/ fever, deep cough, maxillary sinus pain.  Start Bactrim DS and promethazine dextromethorphan cough syrup

## 2013-07-02 ENCOUNTER — Other Ambulatory Visit: Payer: Self-pay | Admitting: Internal Medicine

## 2013-07-02 MED ORDER — SUVOREXANT 20 MG PO TABS: 20.0000 mg | ORAL_TABLET | Freq: Every evening | ORAL | Status: DC | PRN

## 2013-07-30 ENCOUNTER — Ambulatory Visit (INDEPENDENT_AMBULATORY_CARE_PROVIDER_SITE_OTHER): Payer: 59 | Admitting: Family Medicine

## 2013-07-30 ENCOUNTER — Encounter: Payer: Self-pay | Admitting: Family Medicine

## 2013-07-30 VITALS — BP 104/66 | HR 87 | Temp 99.0°F | Ht 65.0 in | Wt 257.2 lb

## 2013-07-30 DIAGNOSIS — R51 Headache: Secondary | ICD-10-CM | POA: Insufficient documentation

## 2013-07-30 DIAGNOSIS — R519 Headache, unspecified: Secondary | ICD-10-CM | POA: Insufficient documentation

## 2013-07-30 DIAGNOSIS — R509 Fever, unspecified: Secondary | ICD-10-CM

## 2013-07-30 LAB — CBC WITH DIFFERENTIAL/PLATELET
Basophils Absolute: 0 10*3/uL (ref 0.0–0.1)
Basophils Relative: 0.5 % (ref 0.0–3.0)
EOS PCT: 1.6 % (ref 0.0–5.0)
Eosinophils Absolute: 0.1 10*3/uL (ref 0.0–0.7)
HCT: 36.8 % (ref 36.0–46.0)
Hemoglobin: 12 g/dL (ref 12.0–15.0)
LYMPHS ABS: 1 10*3/uL (ref 0.7–4.0)
LYMPHS PCT: 21.1 % (ref 12.0–46.0)
MCHC: 32.5 g/dL (ref 30.0–36.0)
MCV: 81.8 fl (ref 78.0–100.0)
MONOS PCT: 13.4 % — AB (ref 3.0–12.0)
Monocytes Absolute: 0.7 10*3/uL (ref 0.1–1.0)
NEUTROS PCT: 63.4 % (ref 43.0–77.0)
Neutro Abs: 3.2 10*3/uL (ref 1.4–7.7)
PLATELETS: 261 10*3/uL (ref 150.0–400.0)
RBC: 4.5 Mil/uL (ref 3.87–5.11)
RDW: 17.3 % — ABNORMAL HIGH (ref 11.5–15.5)
WBC: 5 10*3/uL (ref 4.0–10.5)

## 2013-07-30 MED ORDER — CYCLOBENZAPRINE HCL 10 MG PO TABS: 10.0000 mg | ORAL_TABLET | ORAL | Status: DC | PRN

## 2013-07-30 MED ORDER — DOXYCYCLINE HYCLATE 100 MG PO TABS: 100.0000 mg | ORAL_TABLET | Freq: Two times a day (BID) | ORAL | Status: DC

## 2013-07-30 MED ORDER — KETOROLAC TROMETHAMINE 30 MG/ML IJ SOLN
30.0000 mg | Freq: Once | INTRAMUSCULAR | Status: AC
  Administered 2013-07-30: 30 mg via INTRAMUSCULAR

## 2013-07-30 MED ORDER — PROMETHAZINE HCL 25 MG PO TABS: 25.0000 mg | ORAL_TABLET | Freq: Three times a day (TID) | ORAL | Status: DC | PRN

## 2013-07-30 NOTE — Patient Instructions (Signed)
Phenergan and flexeril to [place] tor[place]ol now - 30 mg  Fluids/ rest / no work - go home  If neck stiffness/ higher fever / rash- please call ! Cbc and lyme antibodies today

## 2013-07-30 NOTE — Progress Notes (Signed)
Subjective:    Patient ID: [name], female    DOB: [birth year], 48 y.o.   MRN: [id number]  HPI Started on wed -went home with a headache and was sweaty and felt bad  Sent home on Thursday (along with another sick physician)  Head is really hurting  99.0 -on ibuprofen  Has not taken at home  Is nauseated  Not vomiting  No appetitie  Loose stool wed -none since  Drinking a lot of fluids / not eating much   GI bug has been going around the office- now another physician has the same symptoms she does   No neck stiffness - just the headache  Patient Active Problem List   Diagnosis Date Noted  . Acute superficial venous thrombosis of lower extremity 06/03/2012  . Lupus anticoagulant positive 06/03/2012  . Neck pain 06/03/2012  . ASTHMA, PERSISTENT 05/08/2009  . UNSPECIFIED ANEMIA 09/03/2007  . Other Malaise and Fatigue 09/03/2007  . HYPERLIPIDEMIA 11/04/2006  . ADD 11/04/2006  . ACNE NEC 11/04/2006  . BACK PAIN 11/04/2006   No past medical history on file. No past surgical history on file. History  Substance Use Topics  . Smoking status: Never Smoker   . Smokeless tobacco: Not on file  . Alcohol Use: Yes     Comment: occ- wine   No family history on file. Allergies  Allergen Reactions  . Adhesive [Tape]   . Levofloxacin     REACTION: abdiminal pain   Current Outpatient Prescriptions on File Prior to Visit  Medication Sig Dispense Refill  . norethindrone (ORTHO MICRONOR) 0.35 MG tablet Take 1 tablet (0.35 mg total) by mouth daily.  1 Package  3  . Suvorexant (BELSOMRA) 20 MG TABS Take 20 mg by mouth at bedtime as needed.  30 tablet  5   No current facility-administered medications on file prior to visit.     Review of Systems Review of Systems  Constitutional: pos for fever/malaise / poor appetite  ENT neg for rhinorrhea or st  Eyes: Negative for pain and visual disturbance.  Respiratory: Negative for cough and shortness of breath.   Cardiovascular:  Negative for cp or palpitations    Gastrointestinal: Negative for nausea, diarrhea and constipation. pos for nausea w/o vomiting  Genitourinary: Negative for urgency and frequency.  Skin: Negative for pallor or rash   MSK neg for stiff neck  Neurological: Negative for weakness, light-headedness, numbness and headaches.  Hematological: Negative for adenopathy. Does not bruise/bleed easily.  Psychiatric/Behavioral: Negative for dysphoric mood. The patient is not nervous/anxious.         Objective:   Physical Exam  Constitutional: She appears well-developed and well-nourished. No distress.  Fatigued appearing -still in good spirits  HENT:  Head: Normocephalic and atraumatic.  Right Ear: External ear normal.  Left Ear: External ear normal.  Nose: Nose normal.  Mouth/Throat: Oropharynx is clear and moist. No oropharyngeal exudate.  No sinus tenderness  Eyes: Conjunctivae and EOM are normal. Pupils are equal, round, and reactive to light. Right eye exhibits no discharge. Left eye exhibits no discharge. No scleral icterus.  Neck: Normal range of motion. Neck supple. No JVD present. Muscular tenderness present. No spinous process tenderness present. No rigidity. No edema present. No thyromegaly present.  Cervical muscles are tense Nl rom  No meningeal signs   Cardiovascular: Normal rate, regular rhythm and normal heart sounds.   No murmur heard. Pulmonary/Chest: Effort normal and breath sounds normal. No respiratory distress. She has no wheezes.  She has no rales.  Abdominal: Soft. Bowel sounds are normal. She exhibits no distension and no mass. There is no tenderness. There is no rebound and no guarding.  Musculoskeletal: She exhibits no edema.  Lymphadenopathy:    She has no cervical adenopathy.  Neurological: She is alert. She has normal reflexes. No cranial nerve deficit. She exhibits normal muscle tone. Coordination normal.  Skin: Skin is warm and dry. No rash noted. No erythema.  No  insect bites  Psychiatric: She has a normal mood and affect.          Assessment & Plan:   Problem List Items Addressed This Visit     Other   Fever, unspecified - Primary     With headache/ nausea / malaise - has been going around office  Suspect this is a viral syndrome  No meningeal signs -will watch closely for this  Will check cbc with diff and also lyme ab today (given px for doxycycline to hold on to for the weekend- will update when lab returns) Phenergan for nausea (will update if vomiting) toradol 30 IM for headache  Flexeril refill for prn use  Will update     Relevant Medications      ketorolac (TORADOL) 30 MG/ML injection 30 mg (Completed)   Other Relevant Orders      B. Burgdorfi Antibodies      CBC with Differential (Completed)   Headache(784.0)   Relevant Medications      cyclobenzaprine (FLEXERIL) tablet      ketorolac (TORADOL) 30 MG/ML injection 30 mg (Completed)   Other Relevant Orders      B. Burgdorfi Antibodies      CBC with Differential (Completed)

## 2013-07-30 NOTE — Assessment & Plan Note (Addendum)
With headache/ nausea / malaise - has been going around office  Suspect this is a viral syndrome  No meningeal signs -will watch closely for this  Will check cbc with diff and also lyme ab today (given px for doxycycline to hold on to for the weekend- will update when lab returns) Phenergan for nausea (will update if vomiting) toradol 30 IM for headache  Flexeril refill for prn use  Will update

## 2013-07-30 NOTE — Progress Notes (Signed)
Pre visit review using our clinic review tool, if applicable. No additional management support is needed unless otherwise documented below in the visit note. 

## 2013-08-02 ENCOUNTER — Encounter: Payer: Self-pay | Admitting: Family Medicine

## 2013-08-02 ENCOUNTER — Ambulatory Visit (INDEPENDENT_AMBULATORY_CARE_PROVIDER_SITE_OTHER): Payer: 59 | Admitting: Family Medicine

## 2013-08-02 ENCOUNTER — Ambulatory Visit (INDEPENDENT_AMBULATORY_CARE_PROVIDER_SITE_OTHER)
Admission: RE | Admit: 2013-08-02 | Discharge: 2013-08-02 | Disposition: A | Discharge status: Home / Self Care | Payer: 59 | Source: Ambulatory Visit | Attending: Family Medicine | Admitting: Family Medicine

## 2013-08-02 VITALS — BP 106/76 | HR 99 | Temp 98.7°F | Ht 65.0 in | Wt 257.2 lb

## 2013-08-02 DIAGNOSIS — R5381 Other malaise: Secondary | ICD-10-CM

## 2013-08-02 DIAGNOSIS — R5383 Other fatigue: Secondary | ICD-10-CM

## 2013-08-02 DIAGNOSIS — R05 Cough: Secondary | ICD-10-CM

## 2013-08-02 DIAGNOSIS — R509 Fever, unspecified: Secondary | ICD-10-CM

## 2013-08-02 DIAGNOSIS — R059 Cough, unspecified: Secondary | ICD-10-CM | POA: Insufficient documentation

## 2013-08-02 LAB — COMPREHENSIVE METABOLIC PANEL
ALK PHOS: 66 U/L (ref 39–117)
ALT: 20 U/L (ref 0–35)
AST: 28 U/L (ref 0–37)
Albumin: 3.4 g/dL — ABNORMAL LOW (ref 3.5–5.2)
BILIRUBIN TOTAL: 0.6 mg/dL (ref 0.2–1.2)
BUN: 8 mg/dL (ref 6–23)
CALCIUM: 8.4 mg/dL (ref 8.4–10.5)
CHLORIDE: 107 meq/L (ref 96–112)
CO2: 23 mEq/L (ref 19–32)
CREATININE: 0.8 mg/dL (ref 0.4–1.2)
GFR: 81.64 mL/min (ref >60.00)
Glucose, Bld: 96 mg/dL (ref 70–99)
Potassium: 4.1 mEq/L (ref 3.5–5.1)
Sodium: 136 mEq/L (ref 135–145)
Total Protein: 6.8 g/dL (ref 6.0–8.3)

## 2013-08-02 LAB — CBC WITH DIFFERENTIAL/PLATELET
Basophils Absolute: 0 10*3/uL (ref 0.0–0.1)
Basophils Relative: 0.3 % (ref 0.0–3.0)
EOS ABS: 0.1 10*3/uL (ref 0.0–0.7)
Eosinophils Relative: 1.6 % (ref 0.0–5.0)
HCT: 36.5 % (ref 36.0–46.0)
HEMOGLOBIN: 12 g/dL (ref 12.0–15.0)
Lymphocytes Relative: 37.5 % (ref 12.0–46.0)
Lymphs Abs: 1.7 10*3/uL (ref 0.7–4.0)
MCHC: 33 g/dL (ref 30.0–36.0)
MCV: 81.3 fl (ref 78.0–100.0)
MONO ABS: 0.6 10*3/uL (ref 0.1–1.0)
Monocytes Relative: 12 % (ref 3.0–12.0)
NEUTROS ABS: 2.2 10*3/uL (ref 1.4–7.7)
Neutrophils Relative %: 48.6 % (ref 43.0–77.0)
Platelets: 218 10*3/uL (ref 150.0–400.0)
RBC: 4.49 Mil/uL (ref 3.87–5.11)
RDW: 17.3 % — ABNORMAL HIGH (ref 11.5–15.5)
WBC: 4.6 10*3/uL (ref 4.0–10.5)

## 2013-08-02 LAB — POCT URINALYSIS DIPSTICK
BILIRUBIN UA: NEGATIVE
GLUCOSE UA: 100
Ketones, UA: NEGATIVE
Leukocytes, UA: NEGATIVE
Nitrite, UA: NEGATIVE
PH UA: 5.5
Urobilinogen, UA: 0.2

## 2013-08-02 LAB — B. BURGDORFI ANTIBODIES: B BURGDORFERI AB IGG+ IGM: 0.53 {ISR}

## 2013-08-02 LAB — MONONUCLEOSIS SCREEN: MONO SCREEN: POSITIVE — AB

## 2013-08-02 NOTE — Progress Notes (Signed)
Subjective:    Patient ID: [name], female    DOB: 03-11-1965, 48 y.o.   MRN: [id number]  HPI Here with ongoing fever and also   Fever is not as high as it was - she is taking ibuprofen - last dose 400 mg 1 hour ago  Alt with tylenol  Temp will go up and down - headache correlates with headache  No neck stiffness   No sore throat  No rash  She did start a bit of a dry cough- not much but enough to make her head hurt more   Yesterday she did not let her fever get over 99 (covered it with meds)  Not hungry at all -poor appetite  Nausea is better - has been taking phenergan - it does make her sleepy   Results for orders placed in visit on 08/02/13  POCT URINALYSIS DIPSTICK      Result Value Ref Range   Color, UA yellow     Clarity, UA clear     Glucose, UA 100     Bilirubin, UA neg.     Ketones, UA neg.     Spec Grav, UA >=1.030     Blood, UA large     pH, UA 5.5     Protein, UA 30+     Urobilinogen, UA 0.2     Nitrite, UA neg.     Leukocytes, UA Negative     is at the end of menses  No dysuria or hematuria   Patient Active Problem List   Diagnosis Date Noted  . Fever, unspecified 07/30/2013  . Headache(784.0) 07/30/2013  . Acute superficial venous thrombosis of lower extremity 06/03/2012  . Lupus anticoagulant positive 06/03/2012  . Neck pain 06/03/2012  . ASTHMA, PERSISTENT 05/08/2009  . UNSPECIFIED ANEMIA 09/03/2007  . Other Malaise and Fatigue 09/03/2007  . HYPERLIPIDEMIA 11/04/2006  . ADD 11/04/2006  . ACNE NEC 11/04/2006  . BACK PAIN 11/04/2006   No past medical history on file. No past surgical history on file. History  Substance Use Topics  . Smoking status: Never Smoker   . Smokeless tobacco: Not on file  . Alcohol Use: Yes     Comment: occ- wine   No family history on file. Allergies  Allergen Reactions  . Adhesive [Tape]   . Levofloxacin     REACTION: abdiminal pain   Current Outpatient Prescriptions on File Prior to Visit    Medication Sig Dispense Refill  . cyclobenzaprine (FLEXERIL) 10 MG tablet Take 1 tablet (10 mg total) by mouth as needed for muscle spasms.  30 tablet  1  . norethindrone (ORTHO MICRONOR) 0.35 MG tablet Take 1 tablet (0.35 mg total) by mouth daily.  1 Package  3  . promethazine (PHENERGAN) 25 MG tablet Take 1 tablet (25 mg total) by mouth every 8 (eight) hours as needed for nausea or vomiting.  30 tablet  0  . Suvorexant (BELSOMRA) 20 MG TABS Take 20 mg by mouth at bedtime as needed.  30 tablet  5  . doxycycline (VIBRA-TABS) 100 MG tablet Take 1 tablet (100 mg total) by mouth 2 (two) times daily.  20 tablet  0   No current facility-administered medications on file prior to visit.      Review of Systems Review of Systems  Constitutional: pos for fever/ body aches /fatigue and malaise / loss of appetite   Eyes: Negative for pain and visual disturbance.  ENT neg for ST or enlarged LN or  congestion or sinus pain , pos for post nasal drip  Respiratory: Negative for wheeze and shortness of breath.   Cardiovascular: Negative for cp or palpitations    Gastrointestinal: Negative for vomiting , diarrhea and constipation.  Genitourinary: Negative for urgency and frequency.  Skin: Negative for pallor or rash   Neurological: Negative for weakness, numbness and [pos for  headaches.  Hematological: Negative for adenopathy. Does not bruise/bleed easily.  Psychiatric/Behavioral: Negative for dysphoric mood. The patient is not nervous/anxious.         Objective:   Physical Exam  Constitutional: She appears well-developed and well-nourished. No distress.  overwt and fatigued appearing  Afebrile at this visit   Mildly diaphoretic after breaking a fever  HENT:  Head: Normocephalic and atraumatic.  Right Ear: External ear normal.  Left Ear: External ear normal.  Mouth/Throat: Oropharynx is clear and moist. No oropharyngeal exudate.  Boggy nares but clear   Eyes: Conjunctivae and EOM are normal.  Pupils are equal, round, and reactive to light. Right eye exhibits no discharge. Left eye exhibits no discharge. No scleral icterus.  Neck: Normal range of motion. Neck supple.  Nl rom of neck No meningeal signs   Cardiovascular: Regular rhythm, normal heart sounds and intact distal pulses.   No murmur heard. Heart rate in the 90s  Pulmonary/Chest: Effort normal and breath sounds normal. No respiratory distress. She has no wheezes. She has no rales. She exhibits no tenderness.  Abdominal: Soft. Bowel sounds are normal. She exhibits no distension and no mass. There is no tenderness.  Musculoskeletal: She exhibits no edema and no tenderness.  No acute joint changes   Lymphadenopathy:    She has no cervical adenopathy.  Neurological: She is alert. She has normal reflexes. No cranial nerve deficit. She exhibits normal muscle tone. Coordination normal.  Skin: Skin is warm. No rash noted. She is diaphoretic. No erythema. No pallor.  Psychiatric: She has a normal mood and affect.          Assessment & Plan:

## 2013-08-02 NOTE — Progress Notes (Signed)
Pre visit review using our clinic review tool, if applicable. No additional management support is needed unless otherwise documented below in the visit note. 

## 2013-08-02 NOTE — Assessment & Plan Note (Signed)
On and off -controlled with tylenol and ibupfrofen Afebrile in office today Rev last lab cxr and ua today are unremarkable  Repeat cbc (if wbc up would consider blood cx), cmet and mono test

## 2013-08-02 NOTE — Patient Instructions (Addendum)
UA looks ok  cxr now Labs now  [facility] !!- drink lots of water and gatorade  Keep me posted-I will call you soon

## 2013-08-02 NOTE — Assessment & Plan Note (Signed)
Mild/ dry with likely viral syndrome  cxr today unremarkable

## 2013-08-02 NOTE — Assessment & Plan Note (Signed)
slt improvement today Still suspect viral syndrome in light of labs so far and hx of multiple people at her work place with similar symptoms  Repeat cbc today-consider blood cx if wbc is up  Also cmet and mono test  cxr neg and ua unremarkable today

## 2013-10-05 ENCOUNTER — Encounter (HOSPITAL_COMMUNITY): Payer: Self-pay

## 2013-10-05 ENCOUNTER — Encounter: Payer: Self-pay | Admitting: Family Medicine

## 2013-10-05 NOTE — Patient Instructions (Addendum)
   Your procedure is scheduled on:  Monday, Oct 5  Enter through the Main Entrance of [facility] at: 6 AM Pick up the phone at the desk and dial [phone] and inform us of your arrival.  Please call this number if you have any problems the morning of surgery: [phone]  Remember: Do not eat or drink after midnight: Sunday Take these medicines the morning of surgery with a SIP OF WATER:  None  Do not wear jewelry, make-up, or FINGER nail polish No metal in your hair or on your body. Do not wear lotions, powders, perfumes.  You may wear deodorant.  Do not bring valuables to the hospital. Contacts, dentures or bridgework may not be worn into surgery.  Leave suitcase in the car. After Surgery it may be brought to your room. For patients being admitted to the hospital, checkout time is 11:00am the day of discharge.  Home with mother Ellery Plunk  cell [phone]

## 2013-10-06 ENCOUNTER — Telehealth: Payer: Self-pay | Admitting: Family Medicine

## 2013-10-06 ENCOUNTER — Encounter (HOSPITAL_COMMUNITY): Payer: Self-pay

## 2013-10-06 ENCOUNTER — Encounter (HOSPITAL_COMMUNITY)
Admission: RE | Admit: 2013-10-06 | Discharge: 2013-10-06 | Disposition: A | Discharge status: Home / Self Care | Payer: 59 | Source: Ambulatory Visit | Attending: Obstetrics and Gynecology | Admitting: Obstetrics and Gynecology

## 2013-10-06 DIAGNOSIS — Z01812 Encounter for preprocedural laboratory examination: Secondary | ICD-10-CM | POA: Diagnosis present

## 2013-10-06 DIAGNOSIS — D259 Leiomyoma of uterus, unspecified: Secondary | ICD-10-CM | POA: Diagnosis not present

## 2013-10-06 HISTORY — DX: Benign neoplasm of connective and other soft tissue, unspecified: D21.9

## 2013-10-06 HISTORY — DX: Other specified postprocedural states: Z98.890

## 2013-10-06 HISTORY — DX: Acute embolism and thrombosis of unspecified vein: I82.90

## 2013-10-06 HISTORY — DX: Anemia, unspecified: D64.9

## 2013-10-06 HISTORY — DX: Nausea with vomiting, unspecified: R11.2

## 2013-10-06 LAB — CBC
HEMATOCRIT: 37.5 % (ref 36.0–46.0)
HEMOGLOBIN: 11.9 g/dL — AB (ref 12.0–15.0)
MCH: 26.8 pg (ref 26.0–34.0)
MCHC: 31.7 g/dL (ref 30.0–36.0)
MCV: 84.5 fL (ref 78.0–100.0)
Platelets: 430 10*3/uL — ABNORMAL HIGH (ref 150–400)
RBC: 4.44 MIL/uL (ref 3.87–5.11)
RDW: 16.3 % — AB (ref 11.5–15.5)
WBC: 9.5 10*3/uL (ref 4.0–10.5)

## 2013-10-06 MED ORDER — NALTREXONE-BUPROPION HCL ER 8-90 MG PO TB12: 2.0000 | ORAL_TABLET | Freq: Two times a day (BID) | ORAL | Status: DC

## 2013-10-06 NOTE — Telephone Encounter (Signed)
See mychart message Trial of contrave to help wt loss

## 2013-10-15 MED ORDER — SCOPOLAMINE 1 MG/3DAYS TD PT72: 1.0000 | MEDICATED_PATCH | Freq: Once | TRANSDERMAL | Status: DC

## 2013-10-15 MED ORDER — LACTATED RINGERS IV SOLN: INTRAVENOUS | Status: DC

## 2013-10-15 NOTE — H&P (Signed)
[name] is an 48 y.o. G 0 with symptomatic fibroids for abdominal myomectomy.  Pertinent Gynecological History: Menses: flow is moderate Bleeding: none Contraception: none DES exposure: denies Blood transfusions: none Sexually transmitted diseases: no past history Previous GYN Procedures: none  Last mammogram: normal Date: 2015 Last pap: normal Date: 2015 OB History: G0, P0  Menstrual History: Menarche age: unknown  No LMP recorded.    Past Medical History  Diagnosis Date  . PONV (postoperative nausea and vomiting)   . Clot     superficial clot in right leg - baby aspirin  . Pneumonia     rarely uses inhaler - exercise induced  . Anemia   . Fibroids     Past Surgical History  Procedure Laterality Date  . Appendectomy    . Laparotomy      at 48yrs old for ovaian cyst  . Tonsillectomy  1972  . Inner ear surgery      tubes in ears as a child  . Breast surgery      reduction  . Laparoscopic gastric bypass    . Back surgery      x 3 - lumbar fusion on one    No family history on file.  Social History:  reports that she has never smoked. She has never used smokeless tobacco. She reports that she drinks alcohol. She reports that she does not use illicit drugs.  Allergies:  Allergies  Allergen Reactions  . Adhesive [Tape]     blister  . Levofloxacin     REACTION: abdiminal pain  High Dose 750 mg, ok with 500 mg dose    No prescriptions prior to admission    Review of Systems  All other systems reviewed and are negative.   There were no vitals taken for this visit. Physical Exam  Nursing note and vitals reviewed. Constitutional: She appears well-developed.  HENT:  Head: Normocephalic.  Eyes: Pupils are equal, round, and reactive to light.  Neck: Normal range of motion.  Cardiovascular: Normal rate and normal heart sounds.   Respiratory: Effort normal and breath sounds normal.  Genitourinary: Vagina normal.    No results found for this or any  previous visit (from the past 24 hour(s)).  No results found.  Assessment/Plan: Fibroids Abdominal myomectomy Risks reviewed Consent signed [name] L 10/15/2013, 11:33 AM

## 2013-10-17 ENCOUNTER — Encounter (HOSPITAL_COMMUNITY): Payer: Self-pay | Admitting: Anesthesiology

## 2013-10-17 MED ORDER — SODIUM CHLORIDE 0.9 % IV SOLN
3.0000 g | INTRAVENOUS | Status: AC
  Administered 2013-10-18: 3 g via INTRAVENOUS
  Filled 2013-10-17: qty 3

## 2013-10-17 NOTE — Anesthesia Preprocedure Evaluation (Addendum)
Anesthesia Evaluation  Patient identified by MRN, date of birth, ID band Patient awake    Reviewed: Allergy & Precautions, H&P , NPO status , Patient's Chart, lab work & pertinent test results  History of Anesthesia Complications (+) PONV and history of anesthetic complications  Airway Mallampati: II TM Distance: >3 FB Neck ROM: Full    Dental no notable dental hx. (+) Teeth Intact   Pulmonary asthma , pneumonia -, resolved,  breath sounds clear to auscultation  Pulmonary exam normal       Cardiovascular negative cardio ROS  Rhythm:Regular Rate:Normal     Neuro/Psych  Headaches, PSYCHIATRIC DISORDERS ADD   GI/Hepatic Neg liver ROS, S/P Laparoscopic Gastric Bypass   Endo/Other  Morbid obesityHyperlipidemia  Renal/GU negative Renal ROS  negative genitourinary   Musculoskeletal negative musculoskeletal ROS (+)   Abdominal (+) + obese,   Peds  Hematology  (+) anemia , Hx/o Lupus Anticoagulant   Anesthesia Other Findings   Reproductive/Obstetrics Uterine leiomyoma                          Anesthesia Physical Anesthesia Plan  ASA: III  Anesthesia Plan: General   Post-op Pain Management:    Induction: Intravenous  Airway Management Planned: Oral ETT  Additional Equipment:   Intra-op Plan:   Post-operative Plan: Extubation in OR  Informed Consent: I have reviewed the patients History and Physical, chart, labs and discussed the procedure including the risks, benefits and alternatives for the proposed anesthesia with the patient or authorized representative who has indicated his/her understanding and acceptance.   Dental advisory given  Plan Discussed with: CRNA, Anesthesiologist and Surgeon  Anesthesia Plan Comments:         Anesthesia Quick Evaluation

## 2013-10-18 ENCOUNTER — Inpatient Hospital Stay (HOSPITAL_COMMUNITY)
Admission: RE | Admit: 2013-10-18 | Discharge: 2013-10-19 | DRG: 742 | Disposition: A | Discharge status: Home / Self Care | Payer: 59 | Source: Ambulatory Visit | Attending: Obstetrics and Gynecology | Admitting: Obstetrics and Gynecology

## 2013-10-18 ENCOUNTER — Inpatient Hospital Stay (HOSPITAL_COMMUNITY): Payer: 59 | Admitting: Anesthesiology

## 2013-10-18 ENCOUNTER — Encounter (HOSPITAL_COMMUNITY): Payer: 59 | Admitting: Anesthesiology

## 2013-10-18 ENCOUNTER — Encounter (HOSPITAL_COMMUNITY)
Admission: RE | Disposition: A | Discharge status: Home / Self Care | Payer: Self-pay | Source: Ambulatory Visit | Attending: Obstetrics and Gynecology

## 2013-10-18 ENCOUNTER — Encounter (HOSPITAL_COMMUNITY): Payer: Self-pay

## 2013-10-18 DIAGNOSIS — Z6841 Body Mass Index (BMI) 40.0 and over, adult: Secondary | ICD-10-CM | POA: Diagnosis not present

## 2013-10-18 DIAGNOSIS — D259 Leiomyoma of uterus, unspecified: Principal | ICD-10-CM | POA: Diagnosis present

## 2013-10-18 DIAGNOSIS — Z9889 Other specified postprocedural states: Secondary | ICD-10-CM

## 2013-10-18 HISTORY — DX: Unspecified asthma, uncomplicated: J45.909

## 2013-10-18 HISTORY — PX: MYOMECTOMY: SHX85

## 2013-10-18 LAB — CBC
HCT: 35 % — ABNORMAL LOW (ref 36.0–46.0)
Hemoglobin: 11.2 g/dL — ABNORMAL LOW (ref 12.0–15.0)
MCH: 26.9 pg (ref 26.0–34.0)
MCHC: 32 g/dL (ref 30.0–36.0)
MCV: 84.1 fL (ref 78.0–100.0)
Platelets: 340 10*3/uL (ref 150–400)
RBC: 4.16 MIL/uL (ref 3.87–5.11)
RDW: 16.3 % — AB (ref 11.5–15.5)
WBC: 7.9 10*3/uL (ref 4.0–10.5)

## 2013-10-18 LAB — TYPE AND SCREEN
ABO/RH(D): A POS
ANTIBODY SCREEN: NEGATIVE

## 2013-10-18 LAB — PREGNANCY, URINE: Preg Test, Ur: NEGATIVE

## 2013-10-18 LAB — ABO/RH: ABO/RH(D): A POS

## 2013-10-18 SURGERY — MYOMECTOMY, ABDOMINAL APPROACH
Anesthesia: General

## 2013-10-18 MED ORDER — NEOSTIGMINE METHYLSULFATE 10 MG/10ML IV SOLN
INTRAVENOUS | Status: DC | PRN
  Administered 2013-10-18: 5 mg via INTRAVENOUS

## 2013-10-18 MED ORDER — PROPOFOL 10 MG/ML IV EMUL
INTRAVENOUS | Status: AC
  Filled 2013-10-18: qty 20

## 2013-10-18 MED ORDER — FENTANYL CITRATE 0.05 MG/ML IJ SOLN
INTRAMUSCULAR | Status: AC
  Filled 2013-10-18: qty 5

## 2013-10-18 MED ORDER — ROCURONIUM BROMIDE 100 MG/10ML IV SOLN
INTRAVENOUS | Status: AC
  Filled 2013-10-18: qty 1

## 2013-10-18 MED ORDER — LACTATED RINGERS IV SOLN
INTRAVENOUS | Status: DC
  Administered 2013-10-18: 10:00:00 via INTRAVENOUS

## 2013-10-18 MED ORDER — DEXTROSE IN LACTATED RINGERS 5 % IV SOLN
INTRAVENOUS | Status: DC
  Administered 2013-10-18: 11:00:00 via INTRAVENOUS

## 2013-10-18 MED ORDER — LIDOCAINE HCL (CARDIAC) 20 MG/ML IV SOLN
INTRAVENOUS | Status: DC | PRN
  Administered 2013-10-18: 100 mg via INTRAVENOUS

## 2013-10-18 MED ORDER — DEXAMETHASONE SODIUM PHOSPHATE 4 MG/ML IJ SOLN
INTRAMUSCULAR | Status: AC
Start: 2013-10-18 — End: 2013-10-18
  Filled 2013-10-18: qty 1

## 2013-10-18 MED ORDER — SCOPOLAMINE 1 MG/3DAYS TD PT72
1.0000 | MEDICATED_PATCH | Freq: Once | TRANSDERMAL | Status: DC
  Administered 2013-10-18: 1.5 mg via TRANSDERMAL

## 2013-10-18 MED ORDER — 0.9 % SODIUM CHLORIDE (POUR BTL) OPTIME
TOPICAL | Status: DC | PRN
  Administered 2013-10-18: 1000 mL

## 2013-10-18 MED ORDER — PROPOFOL 10 MG/ML IV BOLUS
INTRAVENOUS | Status: DC | PRN
  Administered 2013-10-18: 200 mg via INTRAVENOUS

## 2013-10-18 MED ORDER — SCOPOLAMINE 1 MG/3DAYS TD PT72: 1.0000 | MEDICATED_PATCH | TRANSDERMAL | Status: DC

## 2013-10-18 MED ORDER — DIPHENHYDRAMINE HCL 50 MG/ML IJ SOLN
12.5000 mg | Freq: Four times a day (QID) | INTRAMUSCULAR | Status: DC | PRN
Start: 2013-10-18 — End: 2013-10-19
  Administered 2013-10-18 (×2): 12.5 mg via INTRAVENOUS
  Filled 2013-10-18 (×2): qty 1

## 2013-10-18 MED ORDER — HYDROMORPHONE HCL 1 MG/ML IJ SOLN
INTRAMUSCULAR | Status: AC
  Administered 2013-10-18: 0.5 mg via INTRAVENOUS
  Filled 2013-10-18: qty 1

## 2013-10-18 MED ORDER — TRAMADOL HCL 50 MG PO TABS
50.0000 mg | ORAL_TABLET | Freq: Four times a day (QID) | ORAL | Status: DC | PRN
  Administered 2013-10-18 – 2013-10-19 (×3): 50 mg via ORAL
  Filled 2013-10-18 (×3): qty 1

## 2013-10-18 MED ORDER — GLYCOPYRROLATE 0.2 MG/ML IJ SOLN
INTRAMUSCULAR | Status: DC | PRN
  Administered 2013-10-18: .8 mg via INTRAVENOUS

## 2013-10-18 MED ORDER — NEOSTIGMINE METHYLSULFATE 10 MG/10ML IV SOLN
INTRAVENOUS | Status: AC
  Filled 2013-10-18: qty 1

## 2013-10-18 MED ORDER — DEXAMETHASONE SODIUM PHOSPHATE 4 MG/ML IJ SOLN
INTRAMUSCULAR | Status: DC | PRN
  Administered 2013-10-18: 4 mg via INTRAVENOUS

## 2013-10-18 MED ORDER — HYDROMORPHONE HCL 1 MG/ML IJ SOLN
INTRAMUSCULAR | Status: AC
  Filled 2013-10-18: qty 1

## 2013-10-18 MED ORDER — METOCLOPRAMIDE HCL 5 MG/ML IJ SOLN: 10.0000 mg | Freq: Once | INTRAMUSCULAR | Status: DC | PRN

## 2013-10-18 MED ORDER — ACETAMINOPHEN 160 MG/5ML PO SOLN
ORAL | Status: AC
  Administered 2013-10-18: 950 mg via ORAL
  Filled 2013-10-18: qty 40.6

## 2013-10-18 MED ORDER — BUPIVACAINE HCL (PF) 0.25 % IJ SOLN
INTRAMUSCULAR | Status: AC
  Filled 2013-10-18: qty 30

## 2013-10-18 MED ORDER — ACETAMINOPHEN 160 MG/5ML PO SOLN
950.0000 mg | Freq: Four times a day (QID) | ORAL | Status: DC | PRN
  Administered 2013-10-18: 950 mg via ORAL

## 2013-10-18 MED ORDER — HYDROMORPHONE HCL 1 MG/ML IJ SOLN
0.2500 mg | INTRAMUSCULAR | Status: DC | PRN
  Administered 2013-10-18 (×4): 0.5 mg via INTRAVENOUS

## 2013-10-18 MED ORDER — NALOXONE HCL 0.4 MG/ML IJ SOLN: 0.4000 mg | INTRAMUSCULAR | Status: DC | PRN

## 2013-10-18 MED ORDER — BUPIVACAINE HCL (PF) 0.25 % IJ SOLN
INTRAMUSCULAR | Status: DC | PRN
  Administered 2013-10-18: 10 mL

## 2013-10-18 MED ORDER — MIDAZOLAM HCL 2 MG/2ML IJ SOLN
INTRAMUSCULAR | Status: AC
  Filled 2013-10-18: qty 2

## 2013-10-18 MED ORDER — ONDANSETRON HCL 4 MG/2ML IJ SOLN
INTRAMUSCULAR | Status: AC
  Filled 2013-10-18: qty 2

## 2013-10-18 MED ORDER — IBUPROFEN 600 MG PO TABS
600.0000 mg | ORAL_TABLET | Freq: Four times a day (QID) | ORAL | Status: DC | PRN
  Administered 2013-10-19: 600 mg via ORAL
  Filled 2013-10-18: qty 1

## 2013-10-18 MED ORDER — MENTHOL 3 MG MT LOZG: 1.0000 | LOZENGE | OROMUCOSAL | Status: DC | PRN

## 2013-10-18 MED ORDER — HYDROMORPHONE 0.3 MG/ML IV SOLN
INTRAVENOUS | Status: DC
  Administered 2013-10-18: 3.39 mg via INTRAVENOUS
  Administered 2013-10-18: 10:00:00 via INTRAVENOUS
  Administered 2013-10-18: 1.39 mg via INTRAVENOUS
  Administered 2013-10-18: 0.999 mg via INTRAVENOUS
  Filled 2013-10-18: qty 25

## 2013-10-18 MED ORDER — LIDOCAINE HCL (CARDIAC) 20 MG/ML IV SOLN
INTRAVENOUS | Status: AC
  Filled 2013-10-18: qty 5

## 2013-10-18 MED ORDER — ONDANSETRON HCL 4 MG/2ML IJ SOLN: 4.0000 mg | Freq: Four times a day (QID) | INTRAMUSCULAR | Status: DC | PRN

## 2013-10-18 MED ORDER — LACTATED RINGERS IV SOLN
INTRAVENOUS | Status: DC
  Administered 2013-10-18 (×2): via INTRAVENOUS

## 2013-10-18 MED ORDER — BELLADONNA ALKALOIDS-OPIUM 16.2-60 MG RE SUPP
1.0000 | Freq: Once | RECTAL | Status: AC
  Administered 2013-10-18: 1 via RECTAL

## 2013-10-18 MED ORDER — SCOPOLAMINE 1 MG/3DAYS TD PT72
MEDICATED_PATCH | TRANSDERMAL | Status: AC
  Administered 2013-10-18: 1.5 mg via TRANSDERMAL
  Filled 2013-10-18: qty 1

## 2013-10-18 MED ORDER — DIPHENHYDRAMINE HCL 12.5 MG/5ML PO ELIX
12.5000 mg | ORAL_SOLUTION | Freq: Four times a day (QID) | ORAL | Status: DC | PRN
  Administered 2013-10-18: 12.5 mg via ORAL
  Filled 2013-10-18: qty 5

## 2013-10-18 MED ORDER — MIDAZOLAM HCL 5 MG/5ML IJ SOLN
INTRAMUSCULAR | Status: DC | PRN
  Administered 2013-10-18: 2 mg via INTRAVENOUS

## 2013-10-18 MED ORDER — FENTANYL CITRATE 0.05 MG/ML IJ SOLN
INTRAMUSCULAR | Status: DC | PRN
  Administered 2013-10-18: 150 ug via INTRAVENOUS
  Administered 2013-10-18 (×2): 100 ug via INTRAVENOUS

## 2013-10-18 MED ORDER — HYDROMORPHONE HCL 1 MG/ML IJ SOLN
INTRAMUSCULAR | Status: DC | PRN
  Administered 2013-10-18: 1 mg via INTRAVENOUS

## 2013-10-18 MED ORDER — ONDANSETRON HCL 4 MG/2ML IJ SOLN
INTRAMUSCULAR | Status: DC | PRN
  Administered 2013-10-18: 4 mg via INTRAVENOUS

## 2013-10-18 MED ORDER — SODIUM CHLORIDE 0.9 % IJ SOLN: 9.0000 mL | INTRAMUSCULAR | Status: DC | PRN

## 2013-10-18 MED ORDER — MEPERIDINE HCL 25 MG/ML IJ SOLN: 6.2500 mg | INTRAMUSCULAR | Status: DC | PRN

## 2013-10-18 MED ORDER — ROCURONIUM BROMIDE 100 MG/10ML IV SOLN
INTRAVENOUS | Status: DC | PRN
  Administered 2013-10-18: 60 mg via INTRAVENOUS

## 2013-10-18 MED ORDER — GLYCOPYRROLATE 0.2 MG/ML IJ SOLN
INTRAMUSCULAR | Status: AC
  Filled 2013-10-18: qty 3

## 2013-10-18 SURGICAL SUPPLY — 38 items
ADH SKN CLS LQ APL DERMABOND (GAUZE/BANDAGES/DRESSINGS) ×1
BARRIER ADHS 3X4 INTERCEED (GAUZE/BANDAGES/DRESSINGS) IMPLANT
BRR ADH 4X3 ABS CNTRL BYND (GAUZE/BANDAGES/DRESSINGS)
CANISTER SUCT 3000ML (MISCELLANEOUS) ×3 IMPLANT
CHLORAPREP W/TINT 26ML (MISCELLANEOUS) ×3 IMPLANT
CLOTH BEACON ORANGE TIMEOUT ST (SAFETY) ×3 IMPLANT
CONT PATH 16OZ SNAP LID 3702 (MISCELLANEOUS) ×3 IMPLANT
DECANTER SPIKE VIAL GLASS SM (MISCELLANEOUS) ×3 IMPLANT
DERMABOND ADHESIVE PROPEN (GAUZE/BANDAGES/DRESSINGS) ×2
DERMABOND ADVANCED .7 DNX6 (GAUZE/BANDAGES/DRESSINGS) IMPLANT
DRAPE CESAREAN BIRTH W POUCH (DRAPES) ×3 IMPLANT
GLOVE BIO SURGEON STRL SZ 6.5 (GLOVE) ×2 IMPLANT
GLOVE BIO SURGEONS STRL SZ 6.5 (GLOVE) ×1
GOWN STRL REUS W/TWL LRG LVL3 (GOWN DISPOSABLE) ×9 IMPLANT
NDL HYPO 21X1.5 SAFETY (NEEDLE) IMPLANT
NDL HYPO 25X1 1.5 SAFETY (NEEDLE) ×1 IMPLANT
NEEDLE HYPO 21X1.5 SAFETY (NEEDLE) IMPLANT
NEEDLE HYPO 22GX1.5 SAFETY (NEEDLE) ×3 IMPLANT
NEEDLE HYPO 25X1 1.5 SAFETY (NEEDLE) ×3 IMPLANT
NS IRRIG 1000ML POUR BTL (IV SOLUTION) ×3 IMPLANT
PACK ABDOMINAL GYN (CUSTOM PROCEDURE TRAY) ×3 IMPLANT
PAD ABD 8X7 1/2 STERILE (GAUZE/BANDAGES/DRESSINGS) ×2 IMPLANT
PAD OB MATERNITY 4.3X12.25 (PERSONAL CARE ITEMS) ×3 IMPLANT
SPONGE GAUZE 4X4 12PLY STER LF (GAUZE/BANDAGES/DRESSINGS) ×2 IMPLANT
STAPLER VISISTAT 35W (STAPLE) ×3 IMPLANT
SUT PDS AB 0 CTX 60 (SUTURE) IMPLANT
SUT VIC AB 0 CT1 27 (SUTURE) ×30
SUT VIC AB 0 CT1 27XBRD ANBCTR (SUTURE) ×4 IMPLANT
SUT VIC AB 2-0 CT1 27 (SUTURE) ×3
SUT VIC AB 2-0 CT1 TAPERPNT 27 (SUTURE) ×1 IMPLANT
SUT VIC AB 3-0 CT1 27 (SUTURE) ×9
SUT VIC AB 3-0 CT1 TAPERPNT 27 (SUTURE) ×3 IMPLANT
SYR CONTROL 10ML LL (SYRINGE) ×3 IMPLANT
SYRINGE 10CC LL (SYRINGE) ×3 IMPLANT
TAPE PAPER MEDFIX 1IN X 10YD (GAUZE/BANDAGES/DRESSINGS) ×2 IMPLANT
TOWEL OR 17X24 6PK STRL BLUE (TOWEL DISPOSABLE) ×6 IMPLANT
TRAY FOLEY CATH 14FR (SET/KITS/TRAYS/PACK) ×3 IMPLANT
WATER STERILE IRR 1000ML POUR (IV SOLUTION) ×3 IMPLANT

## 2013-10-18 NOTE — Progress Notes (Signed)
H and P on the chart No significant changes Will proceed with myomectomy Consent signed

## 2013-10-18 NOTE — Anesthesia Procedure Notes (Signed)
Procedures

## 2013-10-18 NOTE — Brief Op Note (Signed)
10/18/2013  8:44 AM  PATIENT:  [name]  48 y.o. female  PRE-OPERATIVE DIAGNOSIS:  fibroids  POST-OPERATIVE DIAGNOSIS:  fibroids  PROCEDURE:  Procedure(s): ABDOMINAL MYOMECTOMY (N/A)  SURGEON:  Surgeon(s) and Role:    * Cyril Mourning, MD - Primary  PHYSICIAN ASSISTANT:   ASSISTANTS: Dr. Louretta Shorten   ANESTHESIA:   general  EBL:  Total I/O In: 1500 [I.V.:1500] Out: 300 [Urine:100; Blood:200]  BLOOD ADMINISTERED:none  DRAINS: Urinary Catheter (Foley)   LOCAL MEDICATIONS USED:  LIDOCAINE   SPECIMEN:  Source of Specimen:  fibroids  DISPOSITION OF SPECIMEN:  PATHOLOGY  COUNTS:  YES  TOURNIQUET:  * No tourniquets in log *  DICTATION: .Other Dictation: Dictation Number [id number]  PLAN OF CARE: Admit to inpatient   PATIENT DISPOSITION:  PACU - hemodynamically stable.   Delay start of Pharmacological VTE agent (>24hrs) due to surgical blood loss or risk of bleeding: not applicable

## 2013-10-18 NOTE — Anesthesia Postprocedure Evaluation (Signed)
  Anesthesia Post Note  Patient: [name]  Procedure(s) Performed: Procedure(s) (LRB): ABDOMINAL MYOMECTOMY (N/A)  Anesthesia type: GA  Patient location: PACU  Post pain: Pain level controlled  Post assessment: Post-op Vital signs reviewed  Last Vitals:  Filed Vitals:   10/18/13 0930  BP: 118/66  Pulse: 68  Temp:   Resp: 14    Post vital signs: Reviewed  Level of consciousness: sedated  Complications: No apparent anesthesia complications

## 2013-10-18 NOTE — Transfer of Care (Signed)
Immediate Anesthesia Transfer of Care Note  Patient: [name]  Procedure(s) Performed: Procedure(s): ABDOMINAL MYOMECTOMY (N/A)  Patient Location: PACU  Anesthesia Type:General  Level of Consciousness: awake, alert  and oriented  Airway & Oxygen Therapy: Patient Spontanous Breathing and Patient connected to nasal cannula oxygen  Post-op Assessment: Report given to PACU RN  Post vital signs: Reviewed  Complications: No apparent anesthesia complications

## 2013-10-18 NOTE — Addendum Note (Signed)
Addendum created 10/18/13 1349 by Billie Lade, CRNA   Modules edited: Notes Section   Notes Section:  File: [id number]

## 2013-10-18 NOTE — Anesthesia Postprocedure Evaluation (Signed)
  Anesthesia Post-op Note  Anesthesia Post Note  Patient: [name]  Procedure(s) Performed: Procedure(s) (LRB): ABDOMINAL MYOMECTOMY (N/A)  Anesthesia type: General  Patient location: [facility]  Post pain: Pain level controlled  Post assessment: Post-op Vital signs reviewed  Last Vitals:  Filed Vitals:   10/18/13 1315  BP: 113/68  Pulse: 75  Temp: 37.1 C  Resp: 18    Post vital signs: Reviewed  Level of consciousness: sedated  Complications: No apparent anesthesia complications

## 2013-10-18 NOTE — Op Note (Signed)
NAMESHI, GROSE NO.:  [id number]  MEDICAL RECORD NO.:  [id number]  LOCATION:  [facility]                          FACILITY:  [facility]  PHYSICIAN:  Chauncy Mangiaracina L. Loletha Bertini, M.D.DATE OF BIRTH:  [birth year]  DATE OF PROCEDURE:  10/18/2013 DATE OF DISCHARGE:                              OPERATIVE REPORT   PREOPERATIVE DIAGNOSIS:  Symptomatic fibroids.  POSTOPERATIVE DIAGNOSIS:  Symptomatic fibroids.  PROCEDURE:  Abdominal myomectomy.  SURGEON:  Randol Zumstein L. Helane Rima, MD  ASSISTANT:  Monia Sabal. Corinna Capra, MD  ANESTHESIA:  General.  EBL:  300 mL.  COMPLICATIONS:  None.  DRAINS:  Foley catheter.  PATHOLOGY:  Fibroids.  DESCRIPTION OF PROCEDURE:  The patient was taken to the operating room. She was intubated.  She was prepped and draped.  Time-out was performed. A Foley catheter had been inserted.  After she was draped, a small Pfannenstiel incision was made.  This was carried down to the fascia. The fascia was scored in the midline and extended laterally.  The rectus muscles were separated in the midline.  The peritoneum was entered bluntly.  The peritoneal incision was then stretched.  The uterus was then exteriorized.  A self-retaining retractor was not used in this case.  The uterus was enlarged with large fibroids.  She had a large 5- cm fibroid that arose from the fundus.  The fallopian tubes appeared normal.  Ovaries were unremarkable.  She has a smaller 4-cm fibroid that was in the lower uterine segment anteriorly.  We removed that first by making an incision anteriorly removing that fibroid and then closing that area with 3 layers using 0 chromic with excellent hemostasis.  We then removed several smaller fibroids with the Bovie and closing each base with figure-of-eight using 0 Vicryl suture.  We then removed the largest fibroid by making an incision over the fundus removing the fibroid, cauterizing the base with the Bovie, and then closing in a series of 3 layers  using 0 Vicryl suture in a running locked stitch.  At the end of the procedure, I could not palpate any further fibroids. There was minimal bleeding noted.  We then irrigated, noted hemostasis was very good, and then placed a very large piece of Interceed from the front to the back that covered all our incision spots.  Of note, the patient wanted a cesarean section because of the nature of the myomectomy.  The uterus was carefully replaced in the abdomen.  The peritoneum was closed using 0 Vicryl, the fascia was closed using 0 Vicryl running stitch starting at each corner and meeting in the midline.  After irrigation of subcutaneous layer, the subcu was closed with a plain gut interrupted.  The skin was closed with 4-0 Vicryl on a Keith needle.  Dermabond was applied.  All sponge, lap, and instrument counts were correct x2.  The patient went to recovery room in stable condition.     Amman Bartel L. Helane Rima, M.D.     Nevin Bloodgood  D:  10/18/2013  T:  10/18/2013  Job:  [id number]

## 2013-10-19 ENCOUNTER — Encounter (HOSPITAL_COMMUNITY): Payer: Self-pay | Admitting: Obstetrics and Gynecology

## 2013-10-19 LAB — BASIC METABOLIC PANEL
Anion gap: 13 (ref 5–15)
BUN: 7 mg/dL (ref 6–23)
CHLORIDE: 100 meq/L (ref 96–112)
CO2: 23 meq/L (ref 19–32)
Calcium: 8.2 mg/dL — ABNORMAL LOW (ref 8.4–10.5)
Creatinine, Ser: 0.74 mg/dL (ref 0.50–1.10)
GFR calc Af Amer: >90 mL/min (ref >90)
GFR calc non Af Amer: >90 mL/min (ref >90)
GLUCOSE: 113 mg/dL — AB (ref 70–99)
POTASSIUM: 3.8 meq/L (ref 3.7–5.3)
SODIUM: 136 meq/L — AB (ref 137–147)

## 2013-10-19 LAB — CBC
HEMATOCRIT: 27.7 % — AB (ref 36.0–46.0)
HEMOGLOBIN: 8.8 g/dL — AB (ref 12.0–15.0)
MCH: 27.1 pg (ref 26.0–34.0)
MCHC: 31.8 g/dL (ref 30.0–36.0)
MCV: 85.2 fL (ref 78.0–100.0)
Platelets: 305 10*3/uL (ref 150–400)
RBC: 3.25 MIL/uL — AB (ref 3.87–5.11)
RDW: 16.5 % — ABNORMAL HIGH (ref 11.5–15.5)
WBC: 10 10*3/uL (ref 4.0–10.5)

## 2013-10-19 MED ORDER — OXYCODONE-ACETAMINOPHEN 5-325 MG PO TABS
1.0000 | ORAL_TABLET | ORAL | Status: DC | PRN
  Administered 2013-10-19: 2 via ORAL
  Filled 2013-10-19: qty 2

## 2013-10-19 MED ORDER — IBUPROFEN 600 MG PO TABS: 600.0000 mg | ORAL_TABLET | Freq: Four times a day (QID) | ORAL | Status: DC | PRN

## 2013-10-19 MED ORDER — TRAMADOL HCL 50 MG PO TABS: 50.0000 mg | ORAL_TABLET | Freq: Four times a day (QID) | ORAL | Status: DC | PRN

## 2013-10-19 MED ORDER — OXYCODONE-ACETAMINOPHEN 5-325 MG PO TABS: 1.0000 | ORAL_TABLET | ORAL | Status: DC | PRN

## 2013-10-19 NOTE — Progress Notes (Signed)
Ur chart review completed.  

## 2013-10-19 NOTE — Progress Notes (Signed)
Discharge instructions complete. Pt understood all instructions and did not have any questions. Pt ambulated out of the hospital and discharged home to family.

## 2013-10-19 NOTE — Progress Notes (Signed)
1 Day Post-Op Procedure(s) (LRB): ABDOMINAL MYOMECTOMY (N/A)  Subjective: Patient reports incisional pain, tolerating PO and no problems voiding.    Objective: I have reviewed patient's vital signs, intake and output, medications and labs.  General: alert, cooperative and appears stated age Vaginal Bleeding: none Incision is clean and dry and intact  Assessment: s/p Procedure(s): ABDOMINAL MYOMECTOMY (N/A): stable, progressing well and tolerating diet  Plan: Advance diet Encourage ambulation Advance to PO medication Discontinue IV fluids  LOS: 1 day    Kaisen Ackers L 10/19/2013, 8:29 AM

## 2013-10-19 NOTE — Discharge Summary (Signed)
Admission Diagnosis: Fibroids  Discharge Diagnosis: Same  Hospital Course: 48 year old female with symptomatic fibroids. Underwent uncomplicated myomectomy. Post op hemoglobin is 8.8. By POD #1 she is voiding, ambulating, and good pain control. Sent home with Ibuprofen, Percocet, Ultram. Discharge instructions given. Follow up with 1 week.

## 2013-10-29 ENCOUNTER — Other Ambulatory Visit: Payer: Self-pay

## 2013-11-18 IMAGING — US US EXTREM LOW VENOUS*L*
1 series · 13 of 20 positions shown · non-contrast
Comparison: Ultrasound 05/19/2012.

CLINICAL DATA: History of superficial venous thrombosis of the
greater saphenous vein.

LEFT LOWER EXTREMITY VENOUS DUPLEX ULTRASOUND
TECHNIQUE: Gray-scale sonography with graded compression, as well
as color Doppler and duplex ultrasound were performed to evaluate
the deep venous system of the lower extremity from the level of the
common femoral vein through the popliteal and proximal calf veins.
Spectral Doppler was utilized to evaluate flow at rest and with
distal augmentation maneuvers.

[Series 1: us extrem low venous*left* · 13 of 20 slices shown]
[im 1/20]
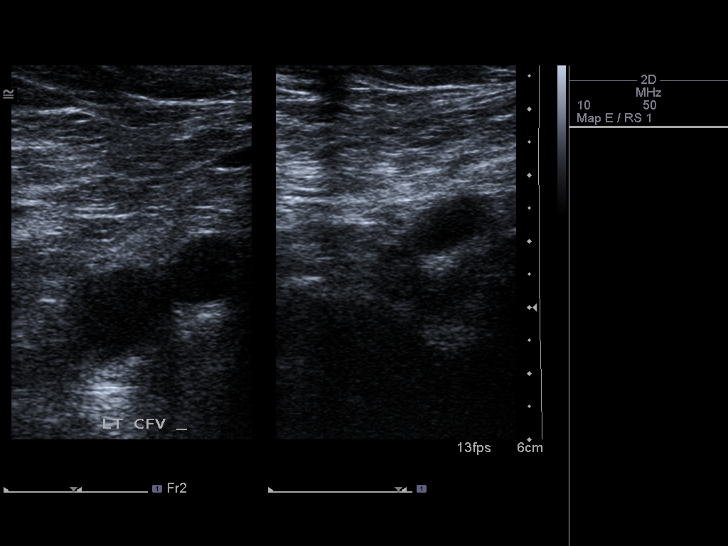
[im 3/20]
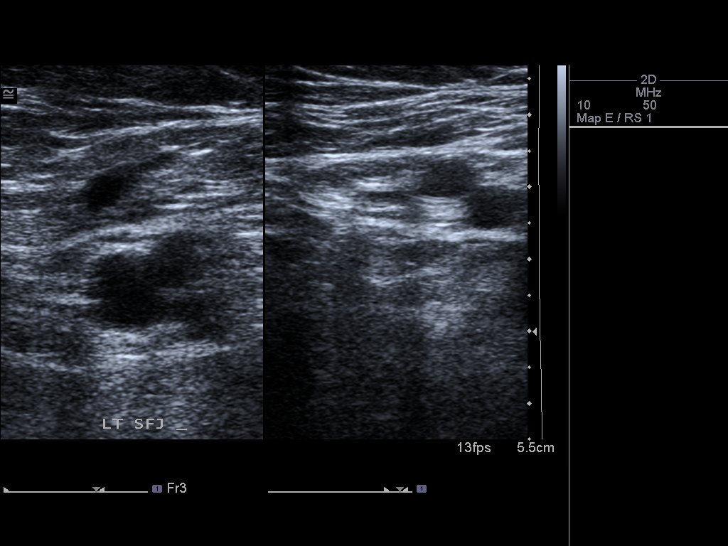
[im 4/20]
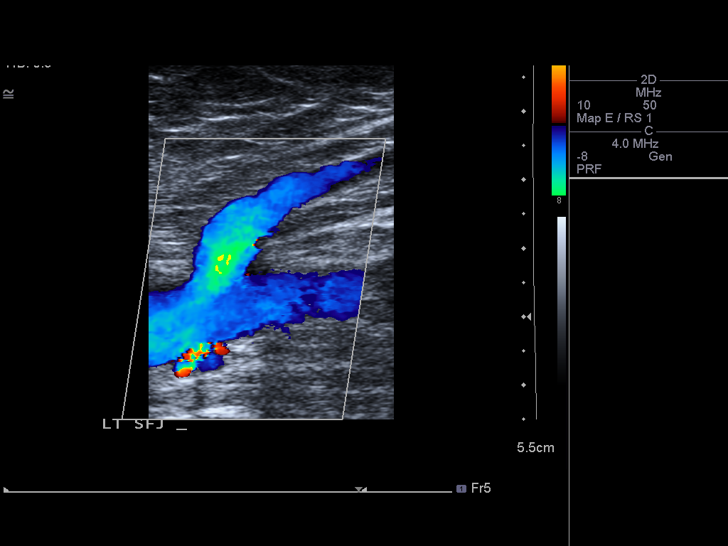
[im 6/20]
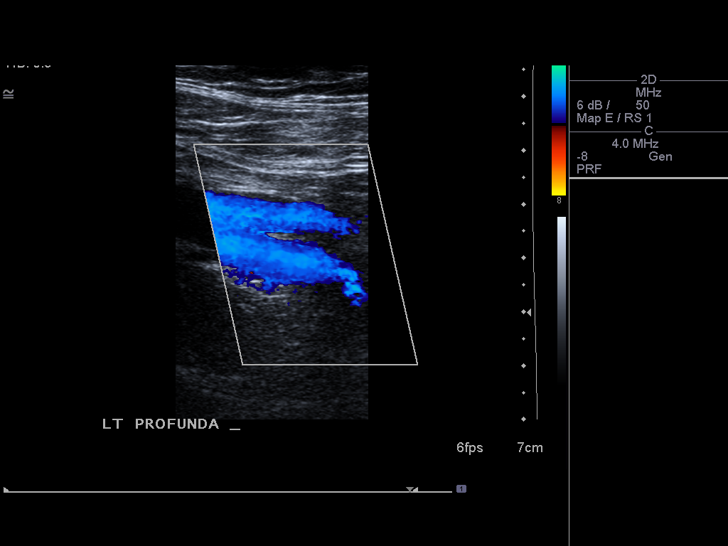
[im 7/20]
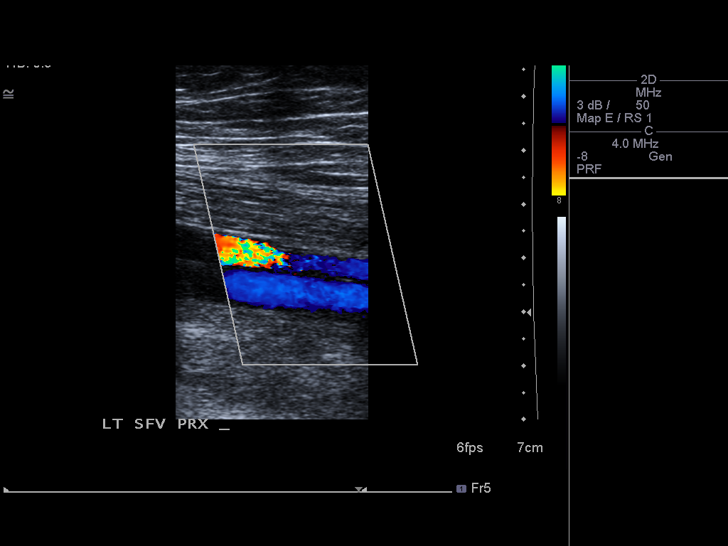
[im 9/20]
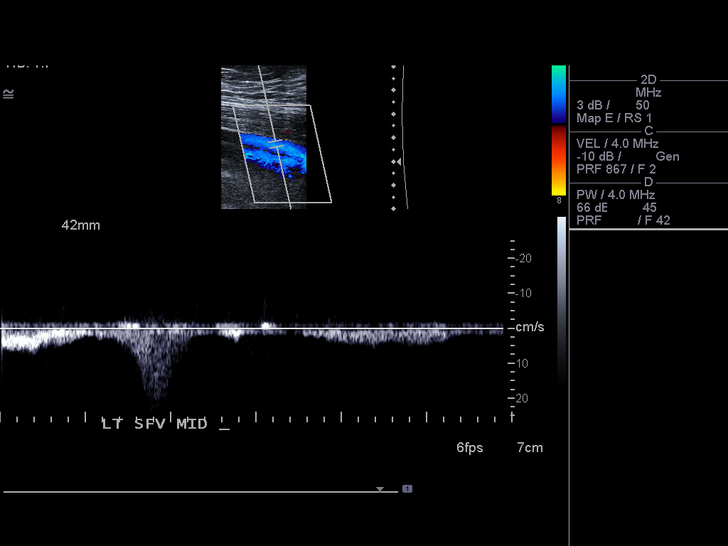
[im 11/20]
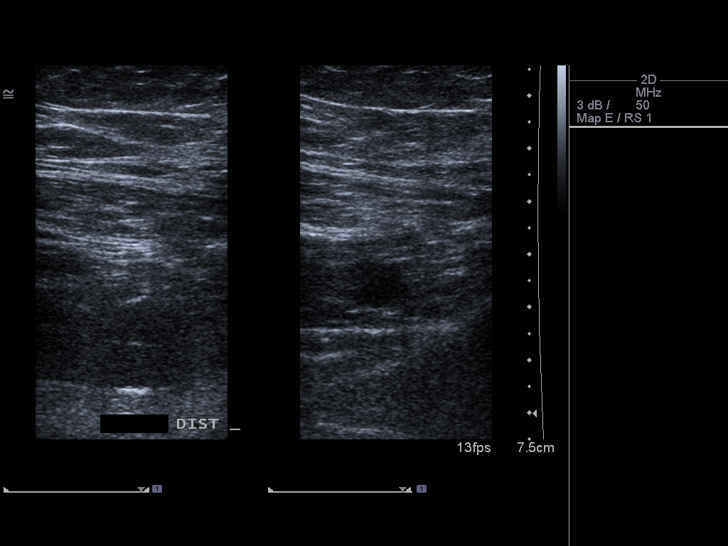
[im 12/20]
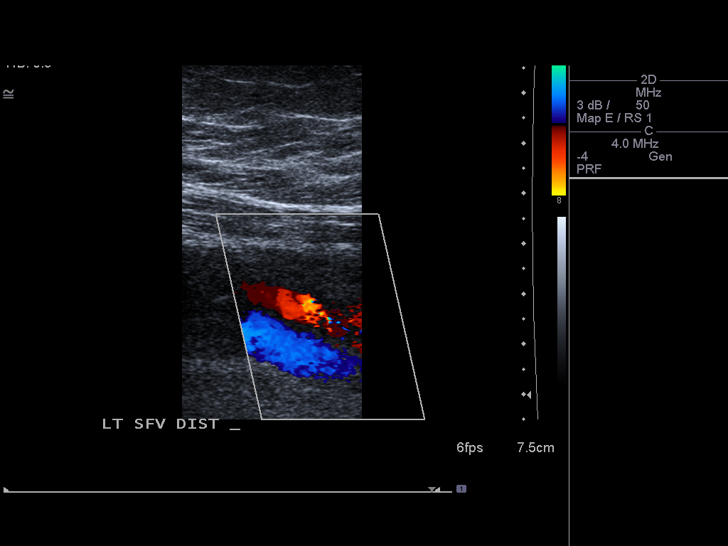
[im 14/20]
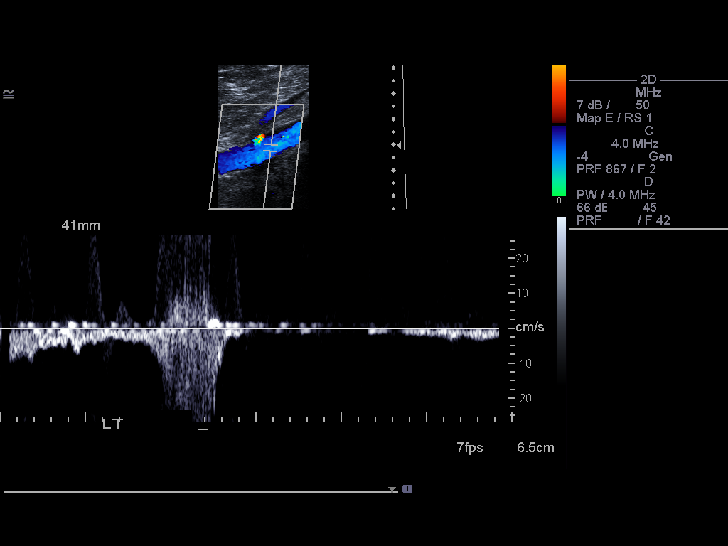
[im 15/20]
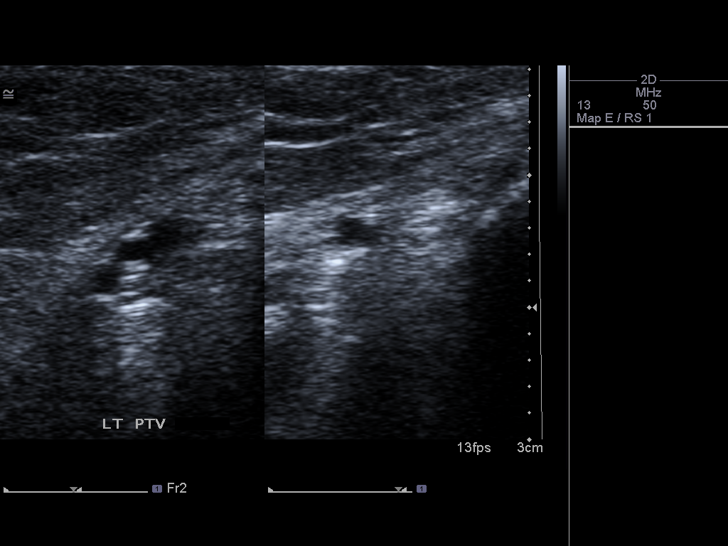
[im 17/20]
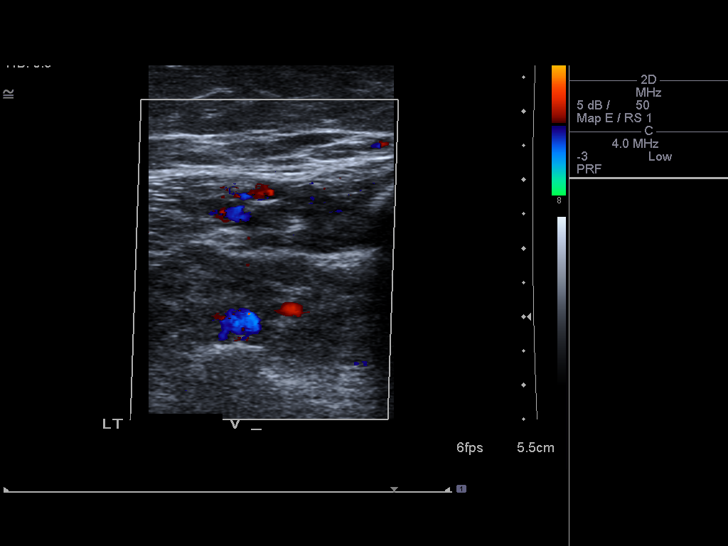
[im 18/20]
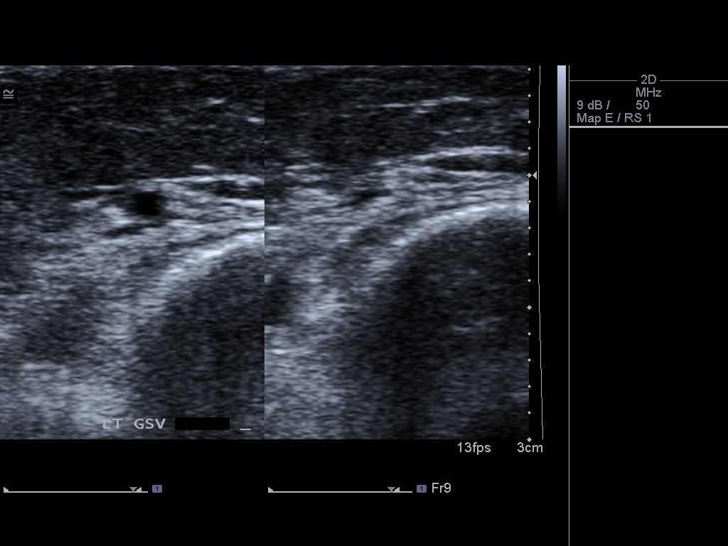
[im 20/20]
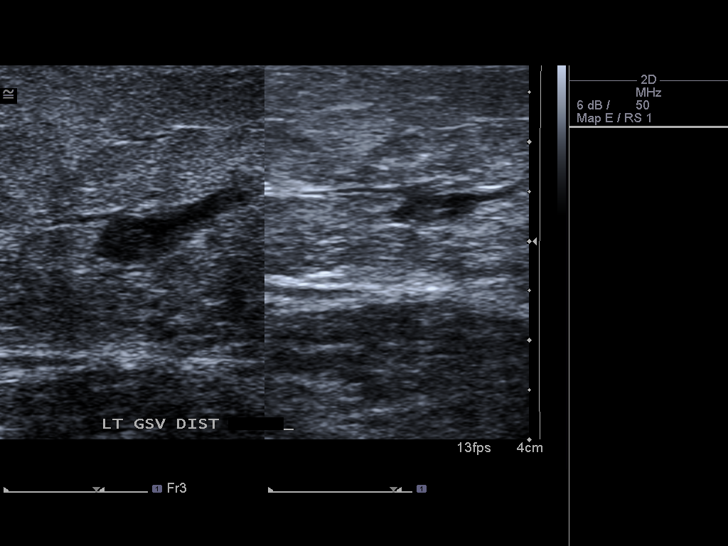

[13 of 20 positions shown; findings below may reference images not displayed]

FINDINGS: As seen on the prior study, there is thrombus within the
greater saphenous vein from the level of the distal thigh to the
knee.  Previously seen clot below the knee is no longer visualized.

Normal compressibility of the common femoral, superficial femoral,
and popliteal veins is demonstrated, as well as the visualized
proximal calf veins.  No filling defects to suggest DVT on
grayscale or color Doppler imaging.  Doppler waveforms show normal
direction of venous flow, normal respiratory phasicity and response
to augmentation.
IMPRESSION: 1.  Interval decrease in thrombus within the greater saphenous vein
which is now seen from the distal thigh to the knee without
extension into the calf as on the prior study.
2.  Negative for deep venous thrombosis.

## 2014-01-06 ENCOUNTER — Other Ambulatory Visit: Payer: Self-pay

## 2014-01-06 MED ORDER — SUVOREXANT 20 MG PO TABS: 20.0000 mg | ORAL_TABLET | Freq: Every evening | ORAL | Status: DC | PRN

## 2014-01-26 ENCOUNTER — Other Ambulatory Visit: Payer: Self-pay

## 2014-01-26 DIAGNOSIS — Z9889 Other specified postprocedural states: Secondary | ICD-10-CM

## 2014-01-26 DIAGNOSIS — Z1231 Encounter for screening mammogram for malignant neoplasm of breast: Secondary | ICD-10-CM

## 2014-02-02 ENCOUNTER — Ambulatory Visit
Admission: RE | Admit: 2014-02-02 | Discharge: 2014-02-02 | Disposition: A | Discharge status: Home / Self Care | Payer: 59 | Source: Ambulatory Visit

## 2014-02-02 DIAGNOSIS — Z1231 Encounter for screening mammogram for malignant neoplasm of breast: Secondary | ICD-10-CM

## 2014-02-02 DIAGNOSIS — Z9889 Other specified postprocedural states: Secondary | ICD-10-CM

## 2014-04-11 ENCOUNTER — Other Ambulatory Visit: Payer: Self-pay | Admitting: Internal Medicine

## 2014-04-11 MED ORDER — PREDNISONE 10 MG PO TABS: ORAL_TABLET | ORAL | Status: DC

## 2014-04-20 ENCOUNTER — Other Ambulatory Visit: Payer: Self-pay | Admitting: Obstetrics and Gynecology

## 2014-04-21 LAB — CYTOLOGY - PAP

## 2014-04-23 ENCOUNTER — Other Ambulatory Visit: Payer: Self-pay | Admitting: Family Medicine

## 2014-06-02 ENCOUNTER — Encounter: Payer: Self-pay | Admitting: Family Medicine

## 2014-06-03 ENCOUNTER — Telehealth: Payer: Self-pay | Admitting: Family Medicine

## 2014-06-03 NOTE — Telephone Encounter (Signed)
Refill contrave

## 2014-06-05 ENCOUNTER — Telehealth: Payer: Self-pay | Admitting: Family Medicine

## 2014-06-05 MED ORDER — NALTREXONE-BUPROPION HCL ER 8-90 MG PO TB12: 2.0000 | ORAL_TABLET | Freq: Two times a day (BID) | ORAL | Status: DC

## 2014-06-05 NOTE — Telephone Encounter (Signed)
Refill contrave

## 2014-06-06 ENCOUNTER — Other Ambulatory Visit: Payer: Self-pay | Admitting: *Deleted

## 2014-06-06 MED ORDER — CYCLOBENZAPRINE HCL 10 MG PO TABS: 10.0000 mg | ORAL_TABLET | Freq: Three times a day (TID) | ORAL | Status: DC | PRN

## 2014-06-06 NOTE — Telephone Encounter (Signed)
She uses prn  Will refill electronically

## 2014-06-06 NOTE — Telephone Encounter (Signed)
Received fax saying pt is requesting a refill on Rx and they have never filled med before, is it okay to refill, please advise

## 2014-06-07 ENCOUNTER — Ambulatory Visit (INDEPENDENT_AMBULATORY_CARE_PROVIDER_SITE_OTHER): Payer: 59 | Admitting: Family Medicine

## 2014-06-07 ENCOUNTER — Encounter: Payer: Self-pay | Admitting: Family Medicine

## 2014-06-07 VITALS — BP 133/83 | HR 71 | Ht 66.0 in

## 2014-06-07 DIAGNOSIS — M722 Plantar fascial fibromatosis: Secondary | ICD-10-CM

## 2014-06-07 NOTE — Patient Instructions (Signed)
You have plantar fasciitis Take tylenol or aleve as needed for pain  Plantar fascia stretch for 20-30 seconds (do 3 of these) in morning Lowering/raise on a step exercises 3 x 10 once or twice a day - this is very important for long term recovery. Can add heel walks, toe walks forward and backward as well Ice heel for 15 minutes as needed. Avoid flat shoes/barefoot walking as much as possible. Arch straps have been shown to help with pain - wear during the day. You may want to add some gel cushion on top of your orthotics Steroid injection is a consideration for short term pain relief if you are struggling (I don't think this would work well given where your pain is). Other considerations: physical therapy, friction massage, active release, night splints.

## 2014-06-08 DIAGNOSIS — M722 Plantar fascial fibromatosis: Secondary | ICD-10-CM | POA: Insufficient documentation

## 2014-06-08 NOTE — Progress Notes (Signed)
PCP: Loura Pardon, MD  Subjective:   HPI: Patient is a 49 y.o. female here for left foot pain.  Patient reports she's had plantar fascia previously. Current issue has been going on for over a month but worsening past 3 weeks especially. Pain back at the heel. Tried icing. Remotely had an injection, tried nsaids. Bought new inserts she's been using for 3 weeks which help.  Past Medical History  Diagnosis Date  . PONV (postoperative nausea and vomiting)   . Clot     superficial clot in right leg - baby aspirin  . Anemia   . Fibroids   . Asthma     exercise induced    Current Outpatient Prescriptions on File Prior to Visit  Medication Sig Dispense Refill  . Naltrexone-Bupropion HCl ER 8-90 MG TB12 Take 2 tablets by mouth 2 (two) times daily. 120 tablet 3  . albuterol (PROVENTIL HFA;VENTOLIN HFA) 108 (90 BASE) MCG/ACT inhaler Inhale 2 puffs into the lungs every 6 (six) hours as needed for wheezing or shortness of breath.    . cyclobenzaprine (FLEXERIL) 10 MG tablet Take 1 tablet (10 mg total) by mouth every 8 (eight) hours as needed for muscle spasms. 30 tablet 1  . Fe Cbn-Fe Gluc-FA-B12-C-DSS (FERRALET 90 PO) Take 1 tablet by mouth daily.    . norethindrone (ORTHO MICRONOR) 0.35 MG tablet Take 1 tablet (0.35 mg total) by mouth daily. 1 Package 3  . Suvorexant (BELSOMRA) 20 MG TABS Take 20 mg by mouth at bedtime as needed. 30 tablet 5   No current facility-administered medications on file prior to visit.    Past Surgical History  Procedure Laterality Date  . Appendectomy    . Laparotomy      at 49yrs old for ovaian cyst  . Tonsillectomy  1972  . Inner ear surgery      tubes in ears as a child  . Breast surgery      reduction  . Laparoscopic gastric bypass    . Back surgery      x 3 - lumbar fusion on one  . Myomectomy N/A 10/18/2013    Procedure: ABDOMINAL MYOMECTOMY;  Surgeon: Cyril Mourning, MD;  Location: [facility];  Service: Gynecology;  Laterality: N/A;     Allergies  Allergen Reactions  . Adhesive [Tape]     blister  . Levofloxacin     REACTION: abdiminal pain  High Dose 750 mg, ok with 500 mg dose    History   Social History  . Marital Status: Single    Spouse Name: N/A  . Number of Children: N/A  . Years of Education: N/A   Occupational History  . Not on file.   Social History Main Topics  . Smoking status: Never Smoker   . Smokeless tobacco: Never Used  . Alcohol Use: 0.0 oz/week    0 Standard drinks or equivalent per week     Comment: occ- wine  . Drug Use: No  . Sexual Activity: Yes    Birth Control/ Protection: Pill   Other Topics Concern  . Not on file   Social History Narrative    No family history on file.  BP 133/83 mmHg  Pulse 71  Ht 5\' 6"  (1.676 m)  Review of Systems: See HPI above.    Objective:  Physical Exam:  Gen: NAD  Left foot/ankle: No gross deformity, swelling, ecchymoses Overpronation.  FROM ankle without pain. TTP at plantar fascia insertion on calcaneus. Negative ant drawer and talar  tilt.   Negative syndesmotic compression. Negative calcaneal squeeze. Thompsons test negative. NV intact distally.    Assessment & Plan:  1. Left plantar fasciitis - reviewed home exercises.  Continue with better arch support, avoid flat shoes/barefoot walking.  Icing, arch binders.  Consider physical therapy, massage, active release, night splints if not improving.  Given posterior location of plantar fasciitis at this time I don't think injection would be a good idea, would risk fat pad atrophy.

## 2014-06-08 NOTE — Assessment & Plan Note (Signed)
reviewed home exercises.  Continue with better arch support, avoid flat shoes/barefoot walking.  Icing, arch binders.  Consider physical therapy, massage, active release, night splints if not improving.  Given posterior location of plantar fasciitis at this time I don't think injection would be a good idea, would risk fat pad atrophy.

## 2014-07-12 ENCOUNTER — Telehealth: Payer: Self-pay

## 2014-07-12 MED ORDER — SUVOREXANT 20 MG PO TABS: 20.0000 mg | ORAL_TABLET | Freq: Every evening | ORAL | Status: DC | PRN

## 2014-07-12 NOTE — Telephone Encounter (Signed)
Rx printed, and faxed to pharmacy.

## 2014-07-12 NOTE — Telephone Encounter (Signed)
Received fax from [facility] requesting refill on Belsomra. Okay per Dr. Larose Kells, #30 tablets and 5 refills.

## 2014-10-27 ENCOUNTER — Other Ambulatory Visit: Payer: Self-pay | Admitting: Internal Medicine

## 2014-10-27 MED ORDER — SUVOREXANT 20 MG PO TABS: 20.0000 mg | ORAL_TABLET | Freq: Every evening | ORAL | Status: DC | PRN

## 2014-11-03 ENCOUNTER — Other Ambulatory Visit: Payer: Self-pay | Admitting: Internal Medicine

## 2014-11-03 MED ORDER — LEVOCETIRIZINE DIHYDROCHLORIDE 5 MG PO TABS: 5.0000 mg | ORAL_TABLET | Freq: Every evening | ORAL | Status: DC

## 2015-02-16 ENCOUNTER — Other Ambulatory Visit: Payer: Self-pay

## 2015-02-16 DIAGNOSIS — Z1231 Encounter for screening mammogram for malignant neoplasm of breast: Secondary | ICD-10-CM

## 2015-02-17 ENCOUNTER — Encounter: Payer: Self-pay | Admitting: Family Medicine

## 2015-02-27 ENCOUNTER — Encounter: Payer: Self-pay | Admitting: Family Medicine

## 2015-02-27 ENCOUNTER — Telehealth: Payer: Self-pay | Admitting: Family Medicine

## 2015-02-27 MED ORDER — CYCLOBENZAPRINE HCL 10 MG PO TABS: 10.0000 mg | ORAL_TABLET | Freq: Three times a day (TID) | ORAL | Status: DC | PRN

## 2015-02-27 MED FILL — CYCLOBENZAPRINE 10 MG TAB: 10 | 10 days supply | Qty: 30 | Fill #0

## 2015-02-27 NOTE — Telephone Encounter (Signed)
Refill muscle relaxer

## 2015-02-28 MED FILL — BELSOMRA 20 MG TABLET: 20 | 30 days supply | Qty: 30 | Fill #3

## 2015-02-28 MED FILL — LEVOCETIRIZINE 5 MG TABLET: 5 | 90 days supply | Qty: 90 | Fill #1

## 2015-03-08 ENCOUNTER — Ambulatory Visit
Admission: RE | Admit: 2015-03-08 | Discharge: 2015-03-08 | Disposition: A | Discharge status: Home / Self Care | Payer: 59 | Source: Ambulatory Visit

## 2015-03-08 DIAGNOSIS — Z1231 Encounter for screening mammogram for malignant neoplasm of breast: Secondary | ICD-10-CM | POA: Diagnosis not present

## 2015-03-22 DIAGNOSIS — G4730 Sleep apnea, unspecified: Secondary | ICD-10-CM | POA: Diagnosis not present

## 2015-03-24 ENCOUNTER — Ambulatory Visit (INDEPENDENT_AMBULATORY_CARE_PROVIDER_SITE_OTHER): Payer: 59 | Admitting: Internal Medicine

## 2015-03-24 ENCOUNTER — Encounter: Payer: Self-pay | Admitting: Internal Medicine

## 2015-03-24 VITALS — BP 118/76 | HR 67 | Temp 98.1°F | Ht 65.0 in | Wt 257.0 lb

## 2015-03-24 DIAGNOSIS — M545 Low back pain, unspecified: Secondary | ICD-10-CM

## 2015-03-24 DIAGNOSIS — M542 Cervicalgia: Secondary | ICD-10-CM | POA: Diagnosis not present

## 2015-03-24 MED ORDER — PREDNISONE 10 MG PO TABS: ORAL_TABLET | ORAL | Status: DC

## 2015-03-24 MED ORDER — KETOROLAC TROMETHAMINE 10 MG PO TABS: 10.0000 mg | ORAL_TABLET | Freq: Four times a day (QID) | ORAL | Status: DC | PRN

## 2015-03-24 MED ORDER — HYDROCODONE-ACETAMINOPHEN 5-325 MG PO TABS: 1.0000 | ORAL_TABLET | Freq: Three times a day (TID) | ORAL | Status: DC | PRN

## 2015-03-24 MED FILL — KETOROLAC 10 MG TABLET: 10 | 5 days supply | Qty: 20 | Fill #0

## 2015-03-24 MED FILL — predniSONE 10 MG TABS: 10 | 8 days supply | Qty: 20 | Fill #0

## 2015-03-24 MED FILL — HYDROCODON-APAP 5-325: 5-325 | 7 days supply | Qty: 40 | Fill #0

## 2015-03-24 MED FILL — CYCLOBENZAPRINE 10 MG TAB: 10 | 10 days supply | Qty: 30 | Fill #1

## 2015-03-24 NOTE — Patient Instructions (Signed)
Warm compress  Ibuprofen or Toradol with food  Flexeril as needed  vicodin as needed  Start prednisone
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
## 2015-03-24 NOTE — Progress Notes (Signed)
Pre visit review using our clinic review tool, if applicable. No additional management support is needed unless otherwise documented below in the visit note. 

## 2015-04-05 MED FILL — BELSOMRA 20 MG TABLET: 20 | 30 days supply | Qty: 30 | Fill #4

## 2015-04-06 ENCOUNTER — Telehealth: Payer: Self-pay | Admitting: Internal Medicine

## 2015-04-06 DIAGNOSIS — R0683 Snoring: Secondary | ICD-10-CM

## 2015-04-06 NOTE — Telephone Encounter (Signed)
Screening test done elsewhere showed possible sleep apnea. We'll get a referral for formal study and evaluation

## 2015-04-14 ENCOUNTER — Telehealth: Payer: Self-pay | Admitting: *Deleted

## 2015-04-14 NOTE — Telephone Encounter (Signed)
Received fax request for Medical Billing Summary; forwarded to [facility] for email/scan//SLS 03/31

## 2015-04-19 ENCOUNTER — Ambulatory Visit (INDEPENDENT_AMBULATORY_CARE_PROVIDER_SITE_OTHER): Payer: 59 | Admitting: Neurology

## 2015-04-19 ENCOUNTER — Encounter: Payer: Self-pay | Admitting: Neurology

## 2015-04-19 VITALS — BP 120/86 | HR 86 | Resp 20 | Ht 65.0 in | Wt 255.0 lb

## 2015-04-19 DIAGNOSIS — R0683 Snoring: Secondary | ICD-10-CM | POA: Diagnosis not present

## 2015-04-19 DIAGNOSIS — G4733 Obstructive sleep apnea (adult) (pediatric): Secondary | ICD-10-CM

## 2015-04-19 MED ORDER — ZOLPIDEM TARTRATE ER 6.25 MG PO TBCR: 6.2500 mg | EXTENDED_RELEASE_TABLET | Freq: Every evening | ORAL | Status: DC | PRN

## 2015-04-19 NOTE — Patient Instructions (Signed)

## 2015-04-19 NOTE — Addendum Note (Signed)
Addended by: Larey Seat on: 04/19/2015 10:07 AM   Modules accepted: Orders

## 2015-04-19 NOTE — Progress Notes (Signed)
SLEEP MEDICINE CLINIC   Provider:  Larey Seat, M D      Referring Provider: Kathlene November, MD   Primary Care Physician:  Loura Pardon, MD  Chief Complaint  Patient presents with  . 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 female , seen here as a referral  from Dr. Kathlene November, MD  after a home sleep test returned abnormal.  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 She had undergone a home sleep test, as she jokingly said "as a [place] pig" [facility] [place], and it identified 30 hypotony is an 1 apnea, the RDI was 5.7 with an extrapolated the analyzed presumed sleep time at 327 minutes with a total number of hypopneas of 190. And the RDI was identified no at 35. The RDI at 35 would be a moderately severe form of apnea in spite of only one  frank apnea being present.  The patient has been told by her husband that she snores and her husband is a CPAP user. She has also hurts that she snores from friends appreciate a bedroom door if she fell asleep in a supine position. It is her own impression that she snores more when she is very fatigued or when she has a cold. She suffers from seasonal respiratory allergies and rhinitis, this congestion also causes her to mouth breathe. The patient uses Nasonex to combat this. This snoring pattern was also very interesting it was 20 dB louder than all other respiratory sounds which would be a marked level of snoring and in supine position with likely be considered thunderous.   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 She is taking call on a rotational schedule. Her usual bedtime is around between 10 PM and 11 PM, that she cannot fall asleep promptly. She is often still busy with notes and works on the computer until  shortly before bedtime. She sees on average of 22 patients on a regular work day. She usually prefers to sleep on the side but since the purchase of a new mattress she actually likes to sleep prone. This should help snoring and apnea. She sleeps on one pillow and uses another one is a body positioning pillow. The bedroom is cool quiet and dark. Her husband CPAP provides a white noise that she appreciates.   The patient's past medical history is positive for possible operative nausea and vomiting, a superficial blood clot in the right leg treated with baby aspirin, anemia after fibroids and dysmenorrhagia, exercise-induced asthma, breast surgery, gastric bypass surgery, in 2004. She also had 3 lumbar surgeries, 2 micro-discectomies and 1 lumbar fusion.   Sleep medical history and family sleep history:  Father is a loud snorer and has OSA, mother snores. One brother and one sister snore, without apnea.     Social history:  Married, childless, physician, caffeine - diet coke , 2 a day, no coffee and no tea. ETOH , 1-2 glasses of wine on weekend evenings. Tobacco - none used ever.   Review of Systems: Out of a complete 14 system review, the patient complains of only the following symptoms, and all other reviewed systems are negative.   Epworth score 5,  Fatigue severity score 38  ,  depression score : PHQ 2 zero.    Social History   Social History  . Marital Status: Single    Spouse Name: N/A  . Number of Children: N/A  . Years of Education: N/A   Occupational History  . Not on file.   Social History Main Topics  . Smoking status: Never Smoker   . Smokeless tobacco: Never Used  . Alcohol Use: 0.0 oz/week    0 Standard drinks or equivalent per week     Comment: occ- wine  . Drug Use: No  . Sexual Activity: Yes    Birth Control/ Protection: Pill   Other Topics Concern  . Not on file   Social History Narrative   Drinks to 1-2 cans of soda daily.      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     Family History  Problem Relation Age of Onset  . Hypertension Mother   . Hyperlipidemia Father   . COPD Father     Past Medical History  Diagnosis Date  . PONV (postoperative nausea and vomiting)   . Clot     superficial clot in right leg - baby aspirin  . Anemia   . Fibroids   . Asthma     exercise induced    Past Surgical History  Procedure Laterality Date  . Appendectomy    . Laparotomy      at 50yrs old for ovaian cyst  . Tonsillectomy  1972  . Inner ear surgery      tubes in ears as a child  . Breast surgery      reduction  . Laparoscopic gastric bypass    . Back surgery      x 3 - lumbar fusion on one  . Myomectomy N/A 10/18/2013    Procedure: ABDOMINAL MYOMECTOMY;  Surgeon: Cyril Mourning, MD;  Location: [facility];  Service: Gynecology;  Laterality: N/A;    Current Outpatient Prescriptions  Medication Sig Dispense Refill  . albuterol (PROVENTIL HFA;VENTOLIN HFA) 108 (90 BASE) MCG/ACT inhaler Inhale 2 puffs into the lungs every 6 (six) hours as needed for wheezing or shortness of breath.    . levocetirizine (XYZAL) 5 MG tablet Take 1 tablet (5 mg total) by mouth every evening. 90 tablet 3  . Suvorexant (BELSOMRA) 20 MG TABS Take 20 mg by mouth at bedtime as needed. 30 tablet 5   No current facility-administered medications for this visit.    Allergies as of 04/19/2015 - Review Complete 04/19/2015  Allergen Reaction Noted  . Adhesive [tape]  06/03/2012  . Levofloxacin  06/07/2009    Vitals: BP 120/86 mmHg  Pulse 86  Resp 20  Ht 5\' 5"  (1.651 m)  Wt 255 lb (115.667 kg)  BMI 42.43 kg/m2 Last Weight:  Wt Readings from Last 1 Encounters:  04/19/15 255 lb (115.667 kg)   [id number] mass index is 42.43 kg/(m^2).     Last Height:   Ht Readings from Last 1 Encounters:  04/19/15 5\' 5"  (1.651 m)    Physical exam:  General: The patient is awake, alert and appears not in acute distress. The patient is well groomed. Head: Normocephalic, atraumatic. Neck is  supple. Mallampati 4,  neck circumference:14.5  Nasal airflow restricted- uses breath rite strips. , TMJ click on the right is  evident . Retrognathia is seen.  Wore braces.  Cardiovascular:  Regular rate and rhythm , without  murmurs or carotid bruit, and without distended neck veins. Respiratory: Lungs are clear to auscultation. Skin:  Without evidence of edema, or rash Trunk: BMI is . The patient's posture is erect   Neurologic exam : The patient is awake and alert, oriented to place and time.     Attention span & concentration ability appears normal.  Speech is fluent,  without  dysarthria, dysphonia or aphasia.  Mood and affect are appropriate.  Cranial nerves: Pupils are equal and briskly reactive to light.  She has a slight facial weakness and pr tsis on the right, inherited. Funduscopic exam without  evidence of pallor or edema.  Extraocular movements  in vertical and horizontal planes intact and without nystagmus. Visual fields by finger perimetry are intact. Hearing to finger rub intact.   Facial sensation intact to fine touch.  Facial motor strength is symmetric and tongue and uvula move midline. Shoulder shrug : click over the left, left shoulder is droopy.   Motor exam:   Normal tone, muscle bulk and symmetric strength in all extremities. Sensory:  Fine touch, pinprick and vibration were tested in all extremities. Proprioception tested in the upper extremities was normal. Coordination: Rapid alternating movements in the fingers/hands was normal. Finger-to-nose maneuver  normal without evidence of ataxia, dysmetria or tremor. Gait and station: Patient walks without assistive device and is able unassisted to climb up to the exam table. Strength within normal limits. Stance is stable and normal.  Turns with  3 Steps. Deep tendon reflexes: in the upper and lower extremities are symmetric and intact.  The patient was advised of the nature of the diagnosed sleep disorder , the  treatment options and risks for general a health and wellness arising from not treating the condition.  I spent more than 35 minutes of face to face time with the patient. Greater than 50% of time was spent in counseling and coordination of care. We have discussed the diagnosis and differential and I answered the patient's questions.      Dear Jacqulyn Bath, Thank you for sending our colleague my way. It was a true pleasure to find the face to the name !    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 She lost 100 pounds after surgery but after her back surgery gained about 50 pounds back and since then is known to snore. Her risk factors include the body mass index and high-grade Mallampati, but her neck circumference is below average.  2) her home sleep test recorded snoring and a so-called respiratory disturbance index, but since there is no EEG component I cannot be sure that these were true sleep apneas, and what proportion of her AHI was related to REM sleep or position.  3) the patient is not excessively daytime sleepy but feels fatigued, I will therefore order a split night polysomnography with capnography. She usually does not wake up with a headache in the morning and she has not experienced nocturnal cluster headaches.    Plan:  Treatment plan and additional workup :  I order a Sleep study with one hour baseline forCA titration, appreciate if capnography can be included in the first hour.  PRN Ambien. 5 mg  Rv after study  Asencion Partridge Macon Sandiford MD  04/19/2015   CC: Abner Greenspan, Md [address], [place], [place] [address]

## 2015-04-20 MED FILL — ZOLPIDEM TART ER 6.25 MG TA: 6.25 | 30 days supply | Qty: 30 | Fill #0

## 2015-04-26 DIAGNOSIS — Z6841 Body Mass Index (BMI) 40.0 and over, adult: Secondary | ICD-10-CM | POA: Diagnosis not present

## 2015-04-26 DIAGNOSIS — Z01419 Encounter for gynecological examination (general) (routine) without abnormal findings: Secondary | ICD-10-CM | POA: Diagnosis not present

## 2015-05-23 ENCOUNTER — Ambulatory Visit (INDEPENDENT_AMBULATORY_CARE_PROVIDER_SITE_OTHER): Payer: 59 | Admitting: Neurology

## 2015-05-23 DIAGNOSIS — R0683 Snoring: Secondary | ICD-10-CM

## 2015-05-23 DIAGNOSIS — G4733 Obstructive sleep apnea (adult) (pediatric): Secondary | ICD-10-CM

## 2015-06-01 ENCOUNTER — Telehealth: Payer: Self-pay

## 2015-06-01 DIAGNOSIS — R0683 Snoring: Secondary | ICD-10-CM

## 2015-06-01 NOTE — Telephone Encounter (Signed)
Spoke to pt regarding her sleep study results. I advised her that study revealed only unsustained, periodic mild snoring and therapies may include an oral appliance or ENT evaluation or procedure. Weight loss and positional therapy could also be entertained. There was no evidence of significant sleep apnea or significant PLMS of sleep resulting in significant sleep disruption. Pt is interested in the oral appliance for snoring. Will refer to Dr. Ron Parker. Pt verbalized understanding of her results. Pt had no questions at this time but was encouraged to call back if questions arise. Pt declined a follow up appt with Dr. Brett Fairy at this time. Pt asked that a copy of her sleep study be faxed to Dr. Larose Kells.

## 2015-06-05 MED FILL — LEVOCETIRIZINE 5 MG TABLET: 5 | 90 days supply | Qty: 90 | Fill #2

## 2015-06-26 DIAGNOSIS — N979 Female infertility, unspecified: Secondary | ICD-10-CM | POA: Diagnosis not present

## 2015-07-03 ENCOUNTER — Other Ambulatory Visit (INDEPENDENT_AMBULATORY_CARE_PROVIDER_SITE_OTHER): Payer: 59

## 2015-07-03 ENCOUNTER — Other Ambulatory Visit: Payer: Self-pay | Admitting: Family Medicine

## 2015-07-03 DIAGNOSIS — N926 Irregular menstruation, unspecified: Secondary | ICD-10-CM

## 2015-07-03 NOTE — Progress Notes (Signed)
Spoke with patient notes irregular menses, Progesterone ordered

## 2015-07-04 LAB — PROGESTERONE: Progesterone: 10.8 ng/mL

## 2015-07-12 MED FILL — CLOMIPHENE CITRATE 50 MG TA: 50 | 5 days supply | Qty: 10 | Fill #0

## 2015-08-21 DIAGNOSIS — N979 Female infertility, unspecified: Secondary | ICD-10-CM | POA: Diagnosis not present

## 2015-10-05 MED FILL — LETROZOLE 2.5 MG TABLET: 2.5 | 5 days supply | Qty: 10 | Fill #0

## 2015-10-16 DIAGNOSIS — N979 Female infertility, unspecified: Secondary | ICD-10-CM | POA: Diagnosis not present

## 2015-10-19 ENCOUNTER — Other Ambulatory Visit: Payer: Self-pay | Admitting: Family Medicine

## 2015-10-19 MED FILL — CYCLOBENZAPRINE 10 MG TAB: 10 | 10 days supply | Qty: 30 | Fill #0

## 2015-10-19 MED FILL — LEVOCETIRIZINE 5 MG TABLET: 5 | 90 days supply | Qty: 90 | Fill #3

## 2015-10-19 NOTE — Telephone Encounter (Signed)
Ok 30 and 1 RF 

## 2015-10-19 NOTE — Telephone Encounter (Signed)
Dr. Glori Bickers is no longer PCP on chart, Routing Rx request to pt's PCP

## 2015-10-20 DIAGNOSIS — N979 Female infertility, unspecified: Secondary | ICD-10-CM | POA: Diagnosis not present

## 2015-10-26 ENCOUNTER — Other Ambulatory Visit: Payer: Self-pay | Admitting: Internal Medicine

## 2015-10-26 DIAGNOSIS — N979 Female infertility, unspecified: Secondary | ICD-10-CM

## 2015-10-27 ENCOUNTER — Other Ambulatory Visit (INDEPENDENT_AMBULATORY_CARE_PROVIDER_SITE_OTHER): Payer: 59

## 2015-10-27 DIAGNOSIS — N979 Female infertility, unspecified: Secondary | ICD-10-CM

## 2015-10-27 LAB — PROGESTERONE: PROGESTERONE: 12.1 ng/mL

## 2015-11-02 ENCOUNTER — Encounter: Payer: Self-pay | Admitting: Internal Medicine

## 2015-11-28 MED FILL — CYCLOBENZAPRINE 10 MG TAB: 10 | 10 days supply | Qty: 30 | Fill #1

## 2015-12-21 ENCOUNTER — Other Ambulatory Visit: Payer: Self-pay | Admitting: Family Medicine

## 2015-12-21 MED ORDER — METHYLPREDNISOLONE 4 MG PO TABS: ORAL_TABLET | ORAL | 0 refills | Status: DC

## 2015-12-21 MED FILL — METHYLPREDNISOLONE 4 MG TAB: 4 | 20 days supply | Qty: 40 | Fill #0

## 2015-12-27 ENCOUNTER — Telehealth: Payer: Self-pay | Admitting: Family Medicine

## 2015-12-27 ENCOUNTER — Encounter: Payer: Self-pay | Admitting: Family Medicine

## 2015-12-27 DIAGNOSIS — M545 Low back pain, unspecified: Secondary | ICD-10-CM | POA: Insufficient documentation

## 2015-12-27 DIAGNOSIS — M51369 Other intervertebral disc degeneration, lumbar region without mention of lumbar back pain or lower extremity pain: Secondary | ICD-10-CM | POA: Insufficient documentation

## 2015-12-27 DIAGNOSIS — M5136 Other intervertebral disc degeneration, lumbar region: Secondary | ICD-10-CM | POA: Insufficient documentation

## 2015-12-27 NOTE — Telephone Encounter (Signed)
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

## 2015-12-30 ENCOUNTER — Ambulatory Visit (HOSPITAL_COMMUNITY)
Admission: RE | Admit: 2015-12-30 | Discharge: 2015-12-30 | Disposition: A | Discharge status: Home / Self Care | Payer: 59 | Source: Ambulatory Visit | Attending: Family Medicine | Admitting: Family Medicine

## 2015-12-30 DIAGNOSIS — M2578 Osteophyte, vertebrae: Secondary | ICD-10-CM | POA: Insufficient documentation

## 2015-12-30 DIAGNOSIS — M5126 Other intervertebral disc displacement, lumbar region: Secondary | ICD-10-CM | POA: Diagnosis not present

## 2015-12-30 DIAGNOSIS — M48061 Spinal stenosis, lumbar region without neurogenic claudication: Secondary | ICD-10-CM | POA: Diagnosis not present

## 2015-12-30 DIAGNOSIS — M545 Low back pain: Secondary | ICD-10-CM | POA: Diagnosis not present

## 2015-12-30 DIAGNOSIS — M5136 Other intervertebral disc degeneration, lumbar region: Secondary | ICD-10-CM

## 2015-12-30 DIAGNOSIS — M4316 Spondylolisthesis, lumbar region: Secondary | ICD-10-CM | POA: Diagnosis not present

## 2015-12-30 DIAGNOSIS — Z981 Arthrodesis status: Secondary | ICD-10-CM | POA: Insufficient documentation

## 2015-12-30 DIAGNOSIS — M1288 Other specific arthropathies, not elsewhere classified, other specified site: Secondary | ICD-10-CM | POA: Insufficient documentation

## 2015-12-30 MED ORDER — GADOBENATE DIMEGLUMINE 529 MG/ML IV SOLN
20.0000 mL | Freq: Once | INTRAVENOUS | Status: AC | PRN
  Administered 2015-12-30: 20 mL via INTRAVENOUS

## 2015-12-31 ENCOUNTER — Encounter: Payer: Self-pay | Admitting: Family Medicine

## 2016-01-02 ENCOUNTER — Telehealth: Payer: Self-pay | Admitting: Family Medicine

## 2016-01-02 MED ORDER — CYCLOBENZAPRINE HCL 10 MG PO TABS: ORAL_TABLET | ORAL | 0 refills | Status: DC

## 2016-01-02 MED ORDER — PREDNISONE 20 MG PO TABS: ORAL_TABLET | ORAL | 0 refills | Status: DC

## 2016-01-02 NOTE — Telephone Encounter (Signed)
Patient with sudden flare in low back pain this am while she was preparing for her out of town vacation. Is given an rx for prednisone taper and flexeril to use as needed. Has care arranged for back pain upon return.

## 2016-01-02 NOTE — Telephone Encounter (Signed)
Sent in Cyclobenzaprine and prednisone per Dr. Charlett Blake instructions to [facility] in [place].

## 2016-01-26 ENCOUNTER — Other Ambulatory Visit: Payer: Self-pay

## 2016-01-26 MED ORDER — PREDNISONE 20 MG PO TABS: ORAL_TABLET | ORAL | 0 refills | Status: DC

## 2016-01-26 MED FILL — predniSONE 20 MG TABS: 20 | 15 days supply | Qty: 18 | Fill #0

## 2016-02-13 ENCOUNTER — Other Ambulatory Visit: Payer: Self-pay | Admitting: Internal Medicine

## 2016-03-13 ENCOUNTER — Other Ambulatory Visit: Payer: Self-pay | Admitting: Family Medicine

## 2016-03-13 ENCOUNTER — Other Ambulatory Visit: Payer: Self-pay | Admitting: Obstetrics and Gynecology

## 2016-03-13 DIAGNOSIS — Z1231 Encounter for screening mammogram for malignant neoplasm of breast: Secondary | ICD-10-CM

## 2016-04-03 ENCOUNTER — Ambulatory Visit: Payer: 59

## 2016-04-10 ENCOUNTER — Ambulatory Visit
Admission: RE | Admit: 2016-04-10 | Discharge: 2016-04-10 | Disposition: A | Discharge status: Home / Self Care | Payer: BC Managed Care – PPO | Source: Ambulatory Visit | Attending: Obstetrics and Gynecology | Admitting: Obstetrics and Gynecology

## 2016-04-10 DIAGNOSIS — Z1231 Encounter for screening mammogram for malignant neoplasm of breast: Secondary | ICD-10-CM

## 2016-05-13 ENCOUNTER — Telehealth (INDEPENDENT_AMBULATORY_CARE_PROVIDER_SITE_OTHER): Payer: BC Managed Care – PPO

## 2016-05-13 ENCOUNTER — Other Ambulatory Visit: Payer: Self-pay | Admitting: Family Medicine

## 2016-05-13 DIAGNOSIS — Z23 Encounter for immunization: Secondary | ICD-10-CM

## 2016-05-13 NOTE — Telephone Encounter (Signed)
Pt received her Shingrix vaccine today. LB

## 2016-06-25 ENCOUNTER — Other Ambulatory Visit: Payer: Self-pay | Admitting: Internal Medicine

## 2016-06-25 MED ORDER — PREDNISONE 10 MG PO TABS: ORAL_TABLET | ORAL | 0 refills | Status: DC

## 2016-06-25 MED FILL — predniSONE 10 MG TABS: 10 | 8 days supply | Qty: 20 | Fill #0

## 2016-06-28 ENCOUNTER — Telehealth: Payer: Self-pay

## 2016-06-28 ENCOUNTER — Telehealth: Payer: Self-pay | Admitting: Family Medicine

## 2016-06-28 MED ORDER — DICLOFENAC SODIUM 1 % TD GEL: TRANSDERMAL | 5 refills | Status: DC

## 2016-06-28 MED FILL — DICLOFENAC SODIUM 1% GEL: 1 | 25 days supply | Qty: 100 | Fill #0

## 2016-06-28 NOTE — Telephone Encounter (Signed)
Called in Voltaren gel for patient's wrist pain.

## 2016-06-28 NOTE — Telephone Encounter (Signed)
PA approved through 06/29/2019. [facility] informed.

## 2016-06-28 NOTE — Telephone Encounter (Signed)
PA initiated via Covermymeds; KEY: [id number]. Awaiting determination.

## 2016-07-09 ENCOUNTER — Other Ambulatory Visit (INDEPENDENT_AMBULATORY_CARE_PROVIDER_SITE_OTHER): Payer: BC Managed Care – PPO | Admitting: *Deleted

## 2016-07-09 DIAGNOSIS — Z23 Encounter for immunization: Secondary | ICD-10-CM

## 2016-07-29 ENCOUNTER — Encounter: Payer: Self-pay | Admitting: Family Medicine

## 2016-08-07 ENCOUNTER — Encounter: Payer: Self-pay | Admitting: Family Medicine

## 2016-08-07 ENCOUNTER — Ambulatory Visit (INDEPENDENT_AMBULATORY_CARE_PROVIDER_SITE_OTHER): Payer: BC Managed Care – PPO | Admitting: Family Medicine

## 2016-08-07 VITALS — BP 116/80 | HR 65 | Temp 98.4°F | Ht 65.0 in | Wt 260.5 lb

## 2016-08-07 DIAGNOSIS — Z209 Contact with and (suspected) exposure to unspecified communicable disease: Secondary | ICD-10-CM | POA: Insufficient documentation

## 2016-08-07 DIAGNOSIS — E7800 Pure hypercholesterolemia, unspecified: Secondary | ICD-10-CM

## 2016-08-07 DIAGNOSIS — R5383 Other fatigue: Secondary | ICD-10-CM | POA: Insufficient documentation

## 2016-08-07 DIAGNOSIS — Z23 Encounter for immunization: Secondary | ICD-10-CM

## 2016-08-07 DIAGNOSIS — R5382 Chronic fatigue, unspecified: Secondary | ICD-10-CM | POA: Diagnosis not present

## 2016-08-07 DIAGNOSIS — Z131 Encounter for screening for diabetes mellitus: Secondary | ICD-10-CM | POA: Insufficient documentation

## 2016-08-07 DIAGNOSIS — D649 Anemia, unspecified: Secondary | ICD-10-CM | POA: Diagnosis not present

## 2016-08-07 LAB — CBC WITH DIFFERENTIAL/PLATELET
BASOS PCT: 0.5 % (ref 0.0–3.0)
Basophils Absolute: 0 10*3/uL (ref 0.0–0.1)
EOS ABS: 0.1 10*3/uL (ref 0.0–0.7)
Eosinophils Relative: 1.6 % (ref 0.0–5.0)
HCT: 36.7 % (ref 36.0–46.0)
Hemoglobin: 12 g/dL (ref 12.0–15.0)
LYMPHS ABS: 2 10*3/uL (ref 0.7–4.0)
Lymphocytes Relative: 33.8 % (ref 12.0–46.0)
MCHC: 32.8 g/dL (ref 30.0–36.0)
MCV: 89.3 fl (ref 78.0–100.0)
MONO ABS: 0.5 10*3/uL (ref 0.1–1.0)
Monocytes Relative: 8.7 % (ref 3.0–12.0)
NEUTROS ABS: 3.3 10*3/uL (ref 1.4–7.7)
NEUTROS PCT: 55.4 % (ref 43.0–77.0)
PLATELETS: 363 10*3/uL (ref 150.0–400.0)
RBC: 4.11 Mil/uL (ref 3.87–5.11)
RDW: 15.4 % (ref 11.5–15.5)
WBC: 5.9 10*3/uL (ref 4.0–10.5)

## 2016-08-07 LAB — COMPREHENSIVE METABOLIC PANEL
ALT: 10 U/L (ref 0–35)
AST: 15 U/L (ref 0–37)
Albumin: 4 g/dL (ref 3.5–5.2)
Alkaline Phosphatase: 51 U/L (ref 39–117)
BUN: 12 mg/dL (ref 6–23)
CHLORIDE: 104 meq/L (ref 96–112)
CO2: 28 meq/L (ref 19–32)
CREATININE: 0.66 mg/dL (ref 0.40–1.20)
Calcium: 8.8 mg/dL (ref 8.4–10.5)
GFR: 100.66 mL/min (ref >60.00)
Glucose, Bld: 96 mg/dL (ref 70–99)
POTASSIUM: 4.1 meq/L (ref 3.5–5.1)
SODIUM: 136 meq/L (ref 135–145)
Total Bilirubin: 0.7 mg/dL (ref 0.2–1.2)
Total Protein: 6.6 g/dL (ref 6.0–8.3)

## 2016-08-07 LAB — LIPID PANEL
CHOL/HDL RATIO: 3
Cholesterol: 241 mg/dL — ABNORMAL HIGH (ref 0–200)
HDL: 71.8 mg/dL (ref >39.00)
LDL Cholesterol: 154 mg/dL — ABNORMAL HIGH (ref 0–99)
NonHDL: 168.73
Triglycerides: 72 mg/dL (ref 0.0–149.0)
VLDL: 14.4 mg/dL (ref 0.0–40.0)

## 2016-08-07 LAB — TSH: TSH: 2.72 u[IU]/mL (ref 0.35–4.50)

## 2016-08-07 MED ORDER — HYDROCHLOROTHIAZIDE 25 MG PO TABS: 25.0000 mg | ORAL_TABLET | Freq: Every day | ORAL | 3 refills | Status: DC | PRN

## 2016-08-07 MED ORDER — SCOPOLAMINE 1 MG/3DAYS TD PT72: 1.0000 | MEDICATED_PATCH | TRANSDERMAL | 3 refills | Status: DC

## 2016-08-07 NOTE — Progress Notes (Signed)
Subjective:    Patient ID: [name], female    DOB: 07-10-1965, 51 y.o.   MRN: [id number]  HPI Here for f/u of chornic health problems   Wt Readings from Last 3 Encounters:  08/07/16 260 lb 8 oz (118.2 kg)  04/19/15 255 lb (115.7 kg)  03/24/15 257 lb (116.6 kg)   43.35 kg/m   Doing well overall  Some issues with her back on and off  Has a bulging disk- and going to a great massage therapist to help break up scar tissue   Some wrist problems- had to get a rolling computer  Also plantar fascitis   Needs titer hep B, MMR and Varicella  Also quant gold   Needs Tdap   Fasting for labs   Legs swell when traveling  Needs hctz  Needs scop patch for cruise  Has compression socks and uses asa    Trying to take care of herself- back on wt watchers  Exercise is tough due to foot and back  Getting better   Had her shinrix   Fatigued- interested in labs   Patient Active Problem List   Diagnosis Date Noted  . Exposure to communicable disease 08/07/2016  . Diabetes mellitus screening 08/07/2016  . Fatigue 08/07/2016  . Low back pain 12/27/2015  . Lumbar degenerative disc disease 12/27/2015  . Plantar fasciitis, left 06/08/2014  . S/P myomectomy 10/18/2013  . Acute superficial venous thrombosis of lower extremity 06/03/2012  . Lupus anticoagulant positive 06/03/2012  . ASTHMA, PERSISTENT 05/08/2009  . UNSPECIFIED ANEMIA 09/03/2007  . Hyperlipidemia 11/04/2006  . ADD 11/04/2006  . ACNE NEC 11/04/2006  . BACK PAIN 11/04/2006   Past Medical History:  Diagnosis Date  . Anemia   . Asthma    exercise induced  . Clot    superficial clot in right leg - baby aspirin  . Fibroids   . PONV (postoperative nausea and vomiting)    Past Surgical History:  Procedure Laterality Date  . APPENDECTOMY    . BACK SURGERY     x 3 - lumbar fusion on one  . BREAST SURGERY     reduction  . INNER EAR SURGERY     tubes in ears as a child  . LAPAROSCOPIC GASTRIC BYPASS     . LAPAROTOMY     at 51yr old for ovaian cyst  . MYOMECTOMY N/A 10/18/2013   Procedure: ABDOMINAL MYOMECTOMY;  Surgeon: MCyril Mourning MD;  [facility];  Service: Gynecology;  Laterality: N/A;  . TONSILLECTOMY  1972   Social History  Substance Use Topics  . Smoking status: Never Smoker  . Smokeless tobacco: Never Used  . Alcohol use 0.0 oz/week     Comment: occ- wine   Family History  Problem Relation Age of Onset  . Hypertension Mother   . Hyperlipidemia Father   . COPD Father    Allergies  Allergen Reactions  . Adhesive [Tape]     blister  . Levofloxacin     REACTION: abdiminal pain  High Dose 750 mg, ok with 500 mg dose   Current Outpatient Prescriptions on File Prior to Visit  Medication Sig Dispense Refill  . cyclobenzaprine (FLEXERIL) 10 MG tablet Take 1 tablet (10 mg total) by mouth every 8 (eight) hours as needed for muscle spasms (caution of sedation). (Patient not taking: Reported on 08/07/2016) 30 tablet 1  . diclofenac sodium (VOLTAREN) 1 % GEL Apply layer over affected area on wrist 4 times daily as  needed. (Patient not taking: Reported on 08/07/2016) 1 Tube 5  . levocetirizine (XYZAL) 5 MG tablet TAKE 1 TABLET (5 MG TOTAL) BY MOUTH EVERY EVENING. (Patient not taking: Reported on 08/07/2016) 90 tablet 3   No current facility-administered medications on file prior to visit.     Review of Systems Review of Systems  Constitutional: Negative for fever, appetite change,  and unexpected weight change. pos for faitigue Eyes: Negative for pain and visual disturbance.  Respiratory: Negative for cough and shortness of breath.   Cardiovascular: Negative for cp or palpitations    Gastrointestinal: Negative for nausea, diarrhea and constipation.  Genitourinary: Negative for urgency and frequency.  Skin: Negative for pallor or rash   Neurological: Negative for weakness, light-headedness, numbness and headaches.  Hematological: Negative for adenopathy. Does not  bruise/bleed easily.  Psychiatric/Behavioral: Negative for dysphoric mood. The patient is not nervous/anxious.         Objective:   Physical Exam  Constitutional: She appears well-developed and well-nourished. No distress.  obese and well appearing   HENT:  Head: Normocephalic and atraumatic.  Mouth/Throat: Oropharynx is clear and moist.  Eyes: Pupils are equal, round, and reactive to light. Conjunctivae and EOM are normal.  Neck: Normal range of motion. Neck supple. No JVD present. Carotid bruit is not present. No thyromegaly present.  Cardiovascular: Normal rate, regular rhythm, normal heart sounds and intact distal pulses.  Exam reveals no gallop.   Pulmonary/Chest: Effort normal and breath sounds normal. No respiratory distress. She has no wheezes. She has no rales.  No crackles  Abdominal: Soft. Bowel sounds are normal. She exhibits no distension, no abdominal bruit and no mass. There is no tenderness.  Musculoskeletal: She exhibits no edema or tenderness.  Lymphadenopathy:    She has no cervical adenopathy.  Neurological: She is alert. She has normal reflexes. No cranial nerve deficit.  Skin: Skin is warm and dry. No rash noted. No pallor.  Psychiatric: She has a normal mood and affect.          Assessment & Plan:   Problem List Items Addressed This Visit      Other   Diabetes mellitus screening    Glucose today  Struggles with wt even after bariatric surgery  Disc avoidance of processed carbs      Relevant Orders   Comprehensive metabolic panel (Completed)   Exposure to communicable disease    Lab for titer/ab to hep B/varicella /mmr/and also TB quant gold today for work      Relevant Orders   Hepatitis B surface antibody (Completed)   Varicella zoster antibody, IgG (Completed)   Quantiferon tb gold assay   Measles/Mumps/Rubella Immunity (Completed)   Fatigue    Ongoing  Self care is fair May be work related Lab today      Relevant Orders   CBC with  Differential/Platelet (Completed)   TSH (Completed)   Hyperlipidemia - Primary    Lab today  Fair diet  Bariatric surgery status        Relevant Medications   hydrochlorothiazide (HYDRODIURIL) 25 MG tablet   Other Relevant Orders   Comprehensive metabolic panel (Completed)   Lipid panel (Completed)   UNSPECIFIED ANEMIA    Cbc today  Bariatric surgery status       Relevant Orders   CBC with Differential/Platelet (Completed)    Other Visit Diagnoses    Need for Tdap vaccination       Relevant Orders   Tdap vaccine greater than or equal  to 7yo IM (Completed)

## 2016-08-07 NOTE — Patient Instructions (Signed)
Take care of yourself   Labs today   Tdap today

## 2016-08-08 LAB — MEASLES/MUMPS/RUBELLA IMMUNITY
Mumps IgG: 107 AU/mL — ABNORMAL HIGH (ref <9.00)
Rubella: 7.91 Index — ABNORMAL HIGH (ref <0.90)

## 2016-08-08 LAB — HEPATITIS B SURFACE ANTIBODY, QUANTITATIVE: HEPATITIS B-POST: 156 m[IU]/mL (ref >=10)

## 2016-08-08 LAB — VARICELLA ZOSTER ANTIBODY, IGG: Varicella IgG: >4000 Index — ABNORMAL HIGH (ref <135.00)

## 2016-08-08 NOTE — Assessment & Plan Note (Signed)
Ongoing  Self care is fair May be work related Lab today

## 2016-08-08 NOTE — Assessment & Plan Note (Signed)
Lab today  Fair diet  Bariatric surgery status

## 2016-08-08 NOTE — Assessment & Plan Note (Signed)
Cbc today  Bariatric surgery status

## 2016-08-08 NOTE — Assessment & Plan Note (Signed)
Lab for titer/ab to hep B/varicella /mmr/and also TB quant gold today for work

## 2016-08-08 NOTE — Assessment & Plan Note (Signed)
Glucose today  Struggles with wt even after bariatric surgery  Disc avoidance of processed carbs

## 2016-08-09 ENCOUNTER — Encounter: Payer: Self-pay | Admitting: Family Medicine

## 2016-08-09 LAB — QUANTIFERON TB GOLD ASSAY (BLOOD)
Interferon Gamma Release Assay: NEGATIVE
MITOGEN-NIL SO: 7.77 [IU]/mL
Quantiferon Nil Value: 0.05 IU/mL
Quantiferon Tb Ag Minus Nil Value: 0 IU/mL

## 2016-08-12 NOTE — Telephone Encounter (Signed)
Form faxed to [facility]

## 2016-10-01 ENCOUNTER — Other Ambulatory Visit: Payer: Self-pay | Admitting: Family Medicine

## 2016-10-01 MED ORDER — DICLOFENAC SODIUM 2 % TD SOLN: 1.0000 | Freq: Three times a day (TID) | TRANSDERMAL | 1 refills | Status: DC | PRN

## 2016-10-03 ENCOUNTER — Telehealth: Payer: Self-pay

## 2016-10-03 NOTE — Telephone Encounter (Signed)
PA initiated via Covermymeds; KEY: [id number]. Awaiting determination.

## 2016-10-03 NOTE — Telephone Encounter (Signed)
Received PA paper form, completed and faxed to [phone]. Awaiting determination.

## 2016-10-04 NOTE — Telephone Encounter (Signed)
PA denied for FDA usage, please advise.

## 2016-10-04 NOTE — Telephone Encounter (Signed)
Did we do the Prior Auth for tendonitis? Would try that and also say patient failed Voltaren gel and oral Ibuprofen as well as conservative management such as ice and rest. See if that works. Thanks

## 2016-10-04 NOTE — Telephone Encounter (Signed)
Yes, that is what we did PA for. PA was denied for that FDA usage.

## 2016-10-06 NOTE — Telephone Encounter (Signed)
Any thoughts on other ways to get Pennsaid PA completed?

## 2016-10-06 NOTE — Telephone Encounter (Signed)
She put dequervains tendonitis--- I don't know if it would make a difference if we just said tendonitis?

## 2016-10-07 MED ORDER — DICLOFENAC SODIUM 2 % TD SOLN: 1.0000 "application " | Freq: Two times a day (BID) | TRANSDERMAL | 3 refills | Status: DC

## 2016-10-07 NOTE — Telephone Encounter (Signed)
Can send in Pennsaid to a pharmacy that doesn't require the PA. Should be mailed in a day or two.   Rosemarie Ax, MD [facility] 10/07/2016, 12:39 PM

## 2016-11-20 ENCOUNTER — Encounter: Payer: Self-pay | Admitting: Family Medicine

## 2016-11-20 NOTE — Telephone Encounter (Signed)
Dr. Glori Bickers is out on medical leave, routing this Mychart to another provider to see if they would fill Rx for Dr. Lawson Radar

## 2016-11-21 ENCOUNTER — Other Ambulatory Visit: Payer: Self-pay | Admitting: Family Medicine

## 2016-11-21 MED ORDER — CYCLOBENZAPRINE HCL 10 MG PO TABS: 10.0000 mg | ORAL_TABLET | Freq: Three times a day (TID) | ORAL | 1 refills | Status: DC | PRN

## 2017-01-23 ENCOUNTER — Other Ambulatory Visit: Payer: Self-pay | Admitting: Family Medicine

## 2017-01-23 MED ORDER — TRIAMCINOLONE ACETONIDE 0.1 % EX CREA: 1.0000 "application " | TOPICAL_CREAM | Freq: Two times a day (BID) | CUTANEOUS | 1 refills | Status: DC | PRN

## 2017-01-23 MED FILL — TRIAMCINOLONE 0.1% CREAM: 0.1 | 30 days supply | Qty: 80 | Fill #0

## 2017-01-23 NOTE — Progress Notes (Signed)
Patient with a pruritic rash on face, will try a low dose of Triamcinolone cream, 0.1 % twice daily as needed and report if persists.

## 2017-03-04 ENCOUNTER — Telehealth: Payer: Self-pay | Admitting: Pulmonary Disease

## 2017-03-04 NOTE — Telephone Encounter (Signed)
Left message for patient to return call regarding documents from [place] and human services X1  Patient is needing OV prior to VS signing the paperwork Placed paperwork in middle cubby of VS ontop of blue med folder

## 2017-03-11 NOTE — Telephone Encounter (Signed)
Left voice mail on machine for patient to return phone call back regarding docs.  X2

## 2017-03-12 ENCOUNTER — Other Ambulatory Visit: Payer: Self-pay | Admitting: Obstetrics and Gynecology

## 2017-03-12 DIAGNOSIS — Z1231 Encounter for screening mammogram for malignant neoplasm of breast: Secondary | ICD-10-CM

## 2017-03-12 NOTE — Telephone Encounter (Signed)
Left voice mail on machine for patient to return phone call back regarding in need of OV with [facility]. Rec'd docs from [place] in need of releasing PFT test. Pt last seen [facility] on 09-17-2013. X1

## 2017-03-20 ENCOUNTER — Ambulatory Visit (INDEPENDENT_AMBULATORY_CARE_PROVIDER_SITE_OTHER): Payer: BC Managed Care – PPO | Admitting: Family Medicine

## 2017-03-20 ENCOUNTER — Encounter: Payer: Self-pay | Admitting: Family Medicine

## 2017-03-20 DIAGNOSIS — J209 Acute bronchitis, unspecified: Secondary | ICD-10-CM | POA: Diagnosis not present

## 2017-03-20 MED ORDER — ALBUTEROL SULFATE HFA 108 (90 BASE) MCG/ACT IN AERS
2.0000 | INHALATION_SPRAY | Freq: Four times a day (QID) | RESPIRATORY_TRACT | 0 refills | Status: DC | PRN
Start: 2017-03-20 — End: 2017-11-04

## 2017-03-20 MED ORDER — HYDROCODONE-HOMATROPINE 5-1.5 MG/5ML PO SYRP: 5.0000 mL | ORAL_SOLUTION | Freq: Three times a day (TID) | ORAL | 0 refills | Status: DC | PRN

## 2017-03-20 MED ORDER — METHYLPREDNISOLONE 4 MG PO TABS: ORAL_TABLET | ORAL | 0 refills | Status: DC

## 2017-03-20 MED ORDER — AMOXICILLIN-POT CLAVULANATE 875-125 MG PO TABS: 1.0000 | ORAL_TABLET | Freq: Two times a day (BID) | ORAL | 0 refills | Status: DC

## 2017-03-20 MED FILL — ALBUTEROL SULFATE HFA 108 (: 108 (90 BAS | 25 days supply | Qty: 18 | Fill #0

## 2017-03-20 MED FILL — AMOX-CLAV 875-125 MG TABLET: 875-125 | 10 days supply | Qty: 20 | Fill #0

## 2017-03-20 MED FILL — HYDROCODONE-HOMATROPINE SOL: 5-1.5 | 10 days supply | Qty: 150 | Fill #0

## 2017-03-20 MED FILL — METHYLPREDNISOLONE 4 MG TAB: 4 | 5 days supply | Qty: 15 | Fill #0

## 2017-03-20 NOTE — Progress Notes (Signed)
Subjective:  I acted as a Education administrator for Dr. Charlett Blake. [name], [place]  Patient ID: [name], female    DOB: 09-22-1965, 52 y.o.   MRN: [id number]  No chief complaint on file.   HPI  Patient is in today for evaluation of persistnet respiratory symptoms. She has been sick for more than 10 days. She has fatigue, malaise, myalgias, congestion, coughing, unable to sleep due to cough, notes head congestion as well. Denies CP/palp/fevers/GI or GU c/o. Taking meds as prescribed  Patient Care Team: Tower, Wynelle Fanny, MD as PCP - General (Family Medicine) Dian Queen, MD as Consulting Physician (Obstetrics and Gynecology)   Past Medical History:  Diagnosis Date  . Anemia   . Asthma    exercise induced  . Clot    superficial clot in right leg - baby aspirin  . Fibroids   . PONV (postoperative nausea and vomiting)     Past Surgical History:  Procedure Laterality Date  . APPENDECTOMY    . BACK SURGERY     x 3 - lumbar fusion on one  . BREAST SURGERY     reduction  . INNER EAR SURGERY     tubes in ears as a child  . LAPAROSCOPIC GASTRIC BYPASS    . LAPAROTOMY     at 52yrs old for ovaian cyst  . MYOMECTOMY N/A 10/18/2013   Procedure: ABDOMINAL MYOMECTOMY;  Surgeon: Cyril Mourning, MD;  Location: [facility];  Service: Gynecology;  Laterality: N/A;  . TONSILLECTOMY  1972    Family History  Problem Relation Age of Onset  . Hypertension Mother   . Hyperlipidemia Father   . COPD Father     Social History   Socioeconomic History  . Marital status: Married    Spouse name: Not on file  . Number of children: Not on file  . Years of education: Not on file  . Highest education level: Not on file  Social Needs  . Financial resource strain: Not on file  . Food insecurity - worry: Not on file  . Food insecurity - inability: Not on file  . Transportation needs - medical: Not on file  . Transportation needs - non-medical: Not on file  Occupational History  . Not on file    Tobacco Use  . Smoking status: Never Smoker  . Smokeless tobacco: Never Used  Substance and Sexual Activity  . Alcohol use: Yes    Alcohol/week: 0.0 oz    Comment: occ- wine  . Drug use: No  . Sexual activity: Yes    Birth control/protection: Pill  Other Topics Concern  . Not on file  Social History Narrative   Drinks to 1-2 cans of soda daily.      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    Outpatient Medications Prior to Visit  Medication Sig Dispense Refill  . cyclobenzaprine (FLEXERIL) 10 MG tablet Take 1 tablet (10 mg total) every 8 (eight) hours as needed by mouth for muscle spasms (caution of sedation). 30 tablet 1  . Diclofenac Sodium (PENNSAID) 2 % SOLN Place 1 application onto the skin 2 (two) times daily. 1 Bottle 3  . hydrochlorothiazide (HYDRODIURIL) 25 MG tablet Take 1 tablet (25 mg total) by mouth daily as needed. edema 90 tablet 3  . scopolamine (TRANSDERM-SCOP, 1.5 MG,) 1 MG/3DAYS Place 1 patch (1.5 mg total) onto the skin every 3 (three) days. 4 patch 3  . triamcinolone cream (KENALOG) 0.1 % Apply 1 application topically 2 (two) times daily  as needed. 80 g 1  . levocetirizine (XYZAL) 5 MG tablet TAKE 1 TABLET (5 MG TOTAL) BY MOUTH EVERY EVENING. (Patient not taking: Reported on 08/07/2016) 90 tablet 3   No facility-administered medications prior to visit.     Allergies  Allergen Reactions  . Adhesive [Tape]     blister  . Levofloxacin     REACTION: abdiminal pain  High Dose 750 mg, ok with 500 mg dose    Review of Systems  Constitutional: Positive for malaise/fatigue. Negative for fever.  HENT: Positive for congestion.   Eyes: Negative for blurred vision.  Respiratory: Positive for cough, sputum production, shortness of breath and wheezing.   Cardiovascular: Negative for chest pain, palpitations and leg swelling.  Gastrointestinal: Negative for vomiting.  Musculoskeletal: Positive for myalgias. Negative for back pain.  Skin: Negative for rash.  Neurological: Negative  for loss of consciousness and headaches.       Objective:    Physical Exam  Constitutional: She is oriented to person, place, and time. She appears well-developed and well-nourished. No distress.  HENT:  Head: Normocephalic and atraumatic.  Eyes: Conjunctivae are normal.  Neck: Normal range of motion. No thyromegaly present.  Cardiovascular: Normal rate and regular rhythm.  Pulmonary/Chest: Effort normal. She has wheezes.  Scattered expiratory wheezing bilaterally.   Abdominal: Soft. Bowel sounds are normal. There is no tenderness.  Musculoskeletal: Normal range of motion. She exhibits no edema or deformity.  Neurological: She is alert and oriented to person, place, and time.  Skin: Skin is warm and dry. She is not diaphoretic.  Psychiatric: She has a normal mood and affect.    BP (!) 138/98 (BP Location: Left Arm, Patient Position: Sitting, Cuff Size: Large)   Pulse 76   Temp 97.9 F (36.6 C) (Oral)   Resp 18   SpO2 99%  Wt Readings from Last 3 Encounters:  08/07/16 260 lb 8 oz (118.2 kg)  04/19/15 255 lb (115.7 kg)  03/24/15 257 lb (116.6 kg)   BP Readings from Last 3 Encounters:  03/20/17 (!) 138/98  08/07/16 116/80  04/19/15 120/86     Immunization History  Administered Date(s) Administered  . Hepatitis A 09/29/2002  . Influenza Whole 10/03/2011  . Influenza, High Dose Seasonal PF 08/23/2015  . Influenza-Unspecified 09/29/2014  . Td 01/14/1994  . Tdap 08/07/2016  . Zoster Recombinat (Shingrix) 05/13/2016, 07/09/2016    Health Maintenance  Topic Date Due  . HIV Screening  05/13/1981  . COLONOSCOPY  05/13/2016  . PAP SMEAR  04/19/2017  . MAMMOGRAM  04/11/2018  . TETANUS/TDAP  08/08/2026  . INFLUENZA VACCINE  Completed    Lab Results  Component Value Date   WBC 5.9 08/07/2016   HGB 12.0 08/07/2016   HCT 36.7 08/07/2016   PLT 363.0 08/07/2016   GLUCOSE 96 08/07/2016   CHOL 241 (H) 08/07/2016   TRIG 72.0 08/07/2016   HDL 71.80 08/07/2016   LDLDIRECT  164.6 04/09/2012   LDLCALC 154 (H) 08/07/2016   ALT 10 08/07/2016   AST 15 08/07/2016   NA 136 08/07/2016   K 4.1 08/07/2016   CL 104 08/07/2016   CREATININE 0.66 08/07/2016   BUN 12 08/07/2016   CO2 28 08/07/2016   TSH 2.72 08/07/2016    Lab Results  Component Value Date   TSH 2.72 08/07/2016   Lab Results  Component Value Date   WBC 5.9 08/07/2016   HGB 12.0 08/07/2016   HCT 36.7 08/07/2016   MCV 89.3 08/07/2016  PLT 363.0 08/07/2016   Lab Results  Component Value Date   NA 136 08/07/2016   K 4.1 08/07/2016   CO2 28 08/07/2016   GLUCOSE 96 08/07/2016   BUN 12 08/07/2016   CREATININE 0.66 08/07/2016   BILITOT 0.7 08/07/2016   ALKPHOS 51 08/07/2016   AST 15 08/07/2016   ALT 10 08/07/2016   PROT 6.6 08/07/2016   ALBUMIN 4.0 08/07/2016   CALCIUM 8.8 08/07/2016   ANIONGAP 13 10/19/2013   GFR 100.66 08/07/2016   Lab Results  Component Value Date   CHOL 241 (H) 08/07/2016   Lab Results  Component Value Date   HDL 71.80 08/07/2016   Lab Results  Component Value Date   LDLCALC 154 (H) 08/07/2016   Lab Results  Component Value Date   TRIG 72.0 08/07/2016   Lab Results  Component Value Date   CHOLHDL 3 08/07/2016   No results found for: HGBA1C       Assessment & Plan:   Problem List Items Addressed This Visit    Acute bronchitis    Worsening respiratory illness for > 10 days. Cough now keeping her up at night. Will start Cefdinir, Medrol dosepak and Hydromet qhs. Consider chest xray if no improvement.       Relevant Medications   HYDROcodone-homatropine (HYCODAN) 5-1.5 MG/5ML syrup      I am having Alferd Apa. Lowne-Chase start on methylPREDNISolone, amoxicillin-clavulanate, HYDROcodone-homatropine, and albuterol. I am also having her maintain her levocetirizine, scopolamine, hydrochlorothiazide, Diclofenac Sodium, cyclobenzaprine, and triamcinolone cream.  Meds ordered this encounter  Medications  . methylPREDNISolone (MEDROL) 4 MG tablet     Sig: 5 tab po qd X 1d then 4 tab po qd X 1d then 3 tab po qd X 1d then 2 tab po qd then 1 tab po qd    Dispense:  15 tablet    Refill:  0  . amoxicillin-clavulanate (AUGMENTIN) 875-125 MG tablet    Sig: Take 1 tablet by mouth 2 (two) times daily.    Dispense:  20 tablet    Refill:  0  . HYDROcodone-homatropine (HYCODAN) 5-1.5 MG/5ML syrup    Sig: Take 5 mLs by mouth every 8 (eight) hours as needed for cough.    Dispense:  150 mL    Refill:  0  . albuterol (PROVENTIL HFA;VENTOLIN HFA) 108 (90 Base) MCG/ACT inhaler    Sig: Inhale 2 puffs into the lungs every 6 (six) hours as needed for wheezing or shortness of breath.    Dispense:  1 Inhaler    Refill:  0    CMA served as scribe during this visit. History, Physical and Plan performed by medical provider. Documentation and orders reviewed and attested to.  Penni Homans, MD

## 2017-03-20 NOTE — Assessment & Plan Note (Signed)
Worsening respiratory illness for > 10 days. Cough now keeping her up at night. Will start Cefdinir, Medrol dosepak and Hydromet qhs. Consider chest xray if no improvement.

## 2017-03-23 NOTE — Patient Instructions (Signed)

## 2017-03-31 MED FILL — TRIAMCINOLONE ACETONIDE 0.1: 0.1 | 30 days supply | Qty: 80 | Fill #1

## 2017-04-16 ENCOUNTER — Ambulatory Visit: Payer: BC Managed Care – PPO

## 2017-04-16 ENCOUNTER — Ambulatory Visit
Admission: RE | Admit: 2017-04-16 | Discharge: 2017-04-16 | Disposition: A | Discharge status: Home / Self Care | Payer: BC Managed Care – PPO | Source: Ambulatory Visit | Attending: Obstetrics and Gynecology | Admitting: Obstetrics and Gynecology

## 2017-04-16 DIAGNOSIS — Z1231 Encounter for screening mammogram for malignant neoplasm of breast: Secondary | ICD-10-CM

## 2017-04-28 ENCOUNTER — Other Ambulatory Visit: Payer: Self-pay | Admitting: Occupational Medicine

## 2017-04-28 LAB — CBC WITH DIFFERENTIAL/PLATELET
BASOS PCT: 0.5 %
Basophils Absolute: 43 cells/uL (ref 0–200)
EOS ABS: 111 {cells}/uL (ref 15–500)
Eosinophils Relative: 1.3 %
HCT: 37 % (ref 35.0–45.0)
HEMOGLOBIN: 12.6 g/dL (ref 11.7–15.5)
LYMPHS ABS: 2227 {cells}/uL (ref 850–3900)
MCH: 29.6 pg (ref 27.0–33.0)
MCHC: 34.1 g/dL (ref 32.0–36.0)
MCV: 87.1 fL (ref 80.0–100.0)
MPV: 10.9 fL (ref 7.5–12.5)
Monocytes Relative: 10.6 %
NEUTROS ABS: 5219 {cells}/uL (ref 1500–7800)
Neutrophils Relative %: 61.4 %
PLATELETS: 399 10*3/uL (ref 140–400)
RBC: 4.25 10*6/uL (ref 3.80–5.10)
RDW: 13.8 % (ref 11.0–15.0)
Total Lymphocyte: 26.2 %
WBC: 8.5 10*3/uL (ref 3.8–10.8)
WBCMIX: 901 {cells}/uL (ref 200–950)

## 2017-04-28 LAB — COMPLETE METABOLIC PANEL WITH GFR
AG RATIO: 1.7 (calc) (ref 1.0–2.5)
ALT: 12 U/L (ref 6–29)
AST: 15 U/L (ref 10–35)
Albumin: 4.3 g/dL (ref 3.6–5.1)
Alkaline phosphatase (APISO): 57 U/L (ref 33–130)
BUN: 15 mg/dL (ref 7–25)
CALCIUM: 9 mg/dL (ref 8.6–10.4)
CO2: 23 mmol/L (ref 20–32)
CREATININE: 0.87 mg/dL (ref 0.50–1.05)
Chloride: 100 mmol/L (ref 98–110)
GFR, EST AFRICAN AMERICAN: 90 mL/min/{1.73_m2} (ref >=60)
GFR, EST NON AFRICAN AMERICAN: 78 mL/min/{1.73_m2} (ref >=60)
GLUCOSE: 102 mg/dL (ref 65–139)
Globulin: 2.6 g/dL (calc) (ref 1.9–3.7)
Potassium: 4.2 mmol/L (ref 3.5–5.3)
Sodium: 135 mmol/L (ref 135–146)
TOTAL PROTEIN: 6.9 g/dL (ref 6.1–8.1)
Total Bilirubin: 0.7 mg/dL (ref 0.2–1.2)

## 2017-04-28 LAB — LIPID PANEL
Cholesterol: 265 mg/dL — ABNORMAL HIGH (ref <200)
HDL: 72 mg/dL (ref >50)
LDL Cholesterol (Calc): 170 mg/dL (calc) — ABNORMAL HIGH
NON-HDL CHOLESTEROL (CALC): 193 mg/dL — AB (ref <130)
Total CHOL/HDL Ratio: 3.7 (calc) (ref <5.0)
Triglycerides: 104 mg/dL (ref <150)

## 2017-04-28 LAB — TSH: TSH: 2.7 mIU/L

## 2017-05-08 NOTE — Telephone Encounter (Signed)
[name], there was no sign of this paperwork in your cubby Does patient still need to follow up with Dr Halford Chessman?  No past visit with Dr Halford Chessman Patient saw Dr Joya Gaskins in 2011

## 2017-05-09 NOTE — Telephone Encounter (Signed)
Nothing is needing further on this encounter, there was a question for Dr. Carollee Herter regarding another pt Everything is completed on this paperwork Nothing needed at this time

## 2017-05-27 ENCOUNTER — Other Ambulatory Visit: Payer: Self-pay | Admitting: Internal Medicine

## 2017-05-27 MED ORDER — PREDNISONE 10 MG PO TABS: ORAL_TABLET | ORAL | 0 refills | Status: DC

## 2017-05-27 MED ORDER — CYCLOBENZAPRINE HCL 10 MG PO TABS: 10.0000 mg | ORAL_TABLET | Freq: Three times a day (TID) | ORAL | 1 refills | Status: DC | PRN

## 2017-05-27 MED FILL — CYCLOBENZAPRINE HCL 10 MG T: 10 | 10 days supply | Qty: 30 | Fill #0

## 2017-05-27 MED FILL — predniSONE 10 MG TABS: 10 | 8 days supply | Qty: 20 | Fill #0

## 2017-06-20 MED FILL — VIT D2 1.25 MG (50,000 UNIT: 1.25 MG | 70 days supply | Qty: 10 | Fill #0

## 2017-07-10 MED FILL — CYCLOBENZAPRINE HCL 10 MG T: 10 | 10 days supply | Qty: 30 | Fill #1

## 2017-10-30 NOTE — H&P (Signed)
52 year old female admitted for D and C, Hysteroscopy. She has been experiencing abnormal uterine bleeding. Ultrasound suggestive of endometrial polyp. Past Medical History:  Diagnosis Date  . Anemia   . Asthma    exercise induced  . Clot    superficial clot in right leg - baby aspirin  . Fibroids   . PONV (postoperative nausea and vomiting)    Past Surgical History:  Procedure Laterality Date  . APPENDECTOMY    . BACK SURGERY     x 3 - lumbar fusion on one  . BREAST SURGERY     reduction  . INNER EAR SURGERY     tubes in ears as a child  . LAPAROSCOPIC GASTRIC BYPASS    . LAPAROTOMY     at 52yrs old for ovaian cyst  . MYOMECTOMY N/A 10/18/2013   Procedure: ABDOMINAL MYOMECTOMY;  Surgeon: Cyril Mourning, MD;  Location: [facility];  Service: Gynecology;  Laterality: N/A;  . REDUCTION MAMMAPLASTY Bilateral    1995  . TONSILLECTOMY  1972   Adhesive [tape] and Levofloxacin  Prior to Admission medications   Medication Sig Start Date End Date Taking? Authorizing Provider  albuterol (PROVENTIL HFA;VENTOLIN HFA) 108 (90 Base) MCG/ACT inhaler Inhale 2 puffs into the lungs every 6 (six) hours as needed for wheezing or shortness of breath. 03/20/17   Mosie Lukes, MD  amoxicillin-clavulanate (AUGMENTIN) 875-125 MG tablet Take 1 tablet by mouth 2 (two) times daily. 03/20/17   Mosie Lukes, MD  cyclobenzaprine (FLEXERIL) 10 MG tablet Take 1 tablet (10 mg total) by mouth every 8 (eight) hours as needed for muscle spasms (caution of sedation). 05/27/17   Colon Branch, MD  Diclofenac Sodium (PENNSAID) 2 % SOLN Place 1 application onto the skin 2 (two) times daily. 10/07/16   Rosemarie Ax, MD  hydrochlorothiazide (HYDRODIURIL) 25 MG tablet Take 1 tablet (25 mg total) by mouth daily as needed. edema 08/07/16   Tower, Wynelle Fanny, MD  HYDROcodone-homatropine [facility]) 5-1.5 MG/5ML syrup Take 5 mLs by mouth every 8 (eight) hours as needed for cough. 03/20/17   Mosie Lukes, MD  levocetirizine  (XYZAL) 5 MG tablet TAKE 1 TABLET (5 MG TOTAL) BY MOUTH EVERY EVENING. Patient not taking: Reported on 08/07/2016 02/13/16   Colon Branch, MD  predniSONE (DELTASONE) 10 MG tablet 4 tablets x 2 days, 3 tabs x 2 days, 2 tabs x 2 days, 1 tab x 2 days 05/27/17   Colon Branch, MD  scopolamine (TRANSDERM-SCOP, 1.5 MG,) 1 MG/3DAYS Place 1 patch (1.5 mg total) onto the skin every 3 (three) days. 08/07/16   Tower, Wynelle Fanny, MD  triamcinolone cream (KENALOG) 0.1 % Apply 1 application topically 2 (two) times daily as needed. 01/23/17   Mosie Lukes, MD   Social History   Socioeconomic History  . Marital status: Married    Spouse name: Not on file  . Number of children: Not on file  . Years of education: Not on file  . Highest education level: Not on file  Occupational History  . Not on file  Social Needs  . Financial resource strain: Not on file  . Food insecurity:    Worry: Not on file    Inability: Not on file  . Transportation needs:    Medical: Not on file    Non-medical: Not on file  Tobacco Use  . Smoking status: Never Smoker  . Smokeless tobacco: Never Used  Substance and Sexual Activity  .  Alcohol use: Yes    Alcohol/week: 0.0 standard drinks    Comment: occ- wine  . Drug use: No  . Sexual activity: Yes    Birth control/protection: Pill  Lifestyle  . Physical activity:    Days per week: Not on file    Minutes per session: Not on file  . Stress: Not on file  Relationships  . Social connections:    Talks on phone: Not on file    Gets together: Not on file    Attends religious service: Not on file    Active member of club or organization: Not on file    Attends meetings of clubs or organizations: Not on file    Relationship status: Not on file  Other Topics Concern  . Not on file  Social History Narrative   Drinks to 1-2 cans of soda daily.      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   Family History  Problem Relation Age of Onset  . Hypertension Mother   . Hyperlipidemia Father   .  COPD Father    VSS General alert and oriented Lung CTAB Car RRR Abdomen is soft and non tender Pelvic WNL  IMPRESSION: Endometrial Polyp  PLAN: D and C, Hysteroscopy, Removal of endometrial polyp

## 2017-11-05 ENCOUNTER — Other Ambulatory Visit: Payer: Self-pay

## 2017-11-05 ENCOUNTER — Encounter (HOSPITAL_BASED_OUTPATIENT_CLINIC_OR_DEPARTMENT_OTHER): Payer: Self-pay | Admitting: *Deleted

## 2017-11-05 NOTE — Progress Notes (Addendum)
Spoke with patient via telephone for pre op interview. No medications AM of surgery. Arrival time 0530. Needs CBC, T&S, UPT and ISTAT8 AM of surgery. NPO after MN.

## 2017-11-07 ENCOUNTER — Ambulatory Visit (HOSPITAL_BASED_OUTPATIENT_CLINIC_OR_DEPARTMENT_OTHER): Payer: BC Managed Care – PPO | Admitting: Anesthesiology

## 2017-11-07 ENCOUNTER — Encounter (HOSPITAL_BASED_OUTPATIENT_CLINIC_OR_DEPARTMENT_OTHER): Payer: Self-pay | Admitting: *Deleted

## 2017-11-07 ENCOUNTER — Ambulatory Visit (HOSPITAL_BASED_OUTPATIENT_CLINIC_OR_DEPARTMENT_OTHER)
Admission: RE | Admit: 2017-11-07 | Discharge: 2017-11-07 | Disposition: A | Discharge status: Home / Self Care | Payer: BC Managed Care – PPO | Source: Ambulatory Visit | Attending: Obstetrics and Gynecology | Admitting: Obstetrics and Gynecology

## 2017-11-07 ENCOUNTER — Encounter (HOSPITAL_BASED_OUTPATIENT_CLINIC_OR_DEPARTMENT_OTHER)
Admission: RE | Disposition: A | Discharge status: Home / Self Care | Payer: Self-pay | Source: Ambulatory Visit | Attending: Obstetrics and Gynecology

## 2017-11-07 DIAGNOSIS — J45909 Unspecified asthma, uncomplicated: Secondary | ICD-10-CM | POA: Insufficient documentation

## 2017-11-07 DIAGNOSIS — N840 Polyp of corpus uteri: Secondary | ICD-10-CM | POA: Insufficient documentation

## 2017-11-07 DIAGNOSIS — Z6841 Body Mass Index (BMI) 40.0 and over, adult: Secondary | ICD-10-CM | POA: Insufficient documentation

## 2017-11-07 DIAGNOSIS — Z9884 Bariatric surgery status: Secondary | ICD-10-CM | POA: Insufficient documentation

## 2017-11-07 DIAGNOSIS — Z79899 Other long term (current) drug therapy: Secondary | ICD-10-CM | POA: Diagnosis not present

## 2017-11-07 DIAGNOSIS — N939 Abnormal uterine and vaginal bleeding, unspecified: Secondary | ICD-10-CM | POA: Insufficient documentation

## 2017-11-07 HISTORY — PX: HYSTEROSCOPY WITH D & C: SHX1775

## 2017-11-07 LAB — TYPE AND SCREEN
ABO/RH(D): A POS
ANTIBODY SCREEN: NEGATIVE

## 2017-11-07 LAB — CBC
HCT: 39.7 % (ref 36.0–46.0)
HEMOGLOBIN: 12.3 g/dL (ref 12.0–15.0)
MCH: 29.3 pg (ref 26.0–34.0)
MCHC: 31 g/dL (ref 30.0–36.0)
MCV: 94.5 fL (ref 80.0–100.0)
NRBC: 0 % (ref 0.0–0.2)
PLATELETS: 336 10*3/uL (ref 150–400)
RBC: 4.2 MIL/uL (ref 3.87–5.11)
RDW: 14.5 % (ref 11.5–15.5)
WBC: 8.4 10*3/uL (ref 4.0–10.5)

## 2017-11-07 LAB — POCT I-STAT, CHEM 8
BUN: 13 mg/dL (ref 6–20)
CHLORIDE: 107 mmol/L (ref 98–111)
CREATININE: 0.7 mg/dL (ref 0.44–1.00)
Calcium, Ion: 1.12 mmol/L — ABNORMAL LOW (ref 1.15–1.40)
Glucose, Bld: 91 mg/dL (ref 70–99)
HEMATOCRIT: 34 % — AB (ref 36.0–46.0)
HEMOGLOBIN: 11.6 g/dL — AB (ref 12.0–15.0)
Potassium: 3.7 mmol/L (ref 3.5–5.1)
Sodium: 139 mmol/L (ref 135–145)
TCO2: 23 mmol/L (ref 22–32)

## 2017-11-07 LAB — POCT PREGNANCY, URINE: PREG TEST UR: NEGATIVE

## 2017-11-07 LAB — ABO/RH: ABO/RH(D): A POS

## 2017-11-07 SURGERY — DILATATION AND CURETTAGE /HYSTEROSCOPY
Anesthesia: General

## 2017-11-07 MED ORDER — LIDOCAINE 2% (20 MG/ML) 5 ML SYRINGE
INTRAMUSCULAR | Status: AC
  Filled 2017-11-07: qty 5

## 2017-11-07 MED ORDER — LACTATED RINGERS IV SOLN
INTRAVENOUS | Status: DC
  Filled 2017-11-07: qty 1000

## 2017-11-07 MED ORDER — KETOROLAC TROMETHAMINE 30 MG/ML IJ SOLN
INTRAMUSCULAR | Status: AC
  Filled 2017-11-07: qty 1

## 2017-11-07 MED ORDER — LIDOCAINE 2% (20 MG/ML) 5 ML SYRINGE
INTRAMUSCULAR | Status: DC | PRN
  Administered 2017-11-07: 100 mg via INTRAVENOUS

## 2017-11-07 MED ORDER — MIDAZOLAM HCL 2 MG/2ML IJ SOLN
INTRAMUSCULAR | Status: AC
  Filled 2017-11-07: qty 2

## 2017-11-07 MED ORDER — FENTANYL CITRATE (PF) 100 MCG/2ML IJ SOLN
INTRAMUSCULAR | Status: AC
  Filled 2017-11-07: qty 2

## 2017-11-07 MED ORDER — FENTANYL CITRATE (PF) 100 MCG/2ML IJ SOLN
25.0000 ug | INTRAMUSCULAR | Status: DC | PRN
  Filled 2017-11-07: qty 1

## 2017-11-07 MED ORDER — SODIUM CHLORIDE 0.9 % IR SOLN
Status: DC | PRN
  Administered 2017-11-07: 3000 mL

## 2017-11-07 MED ORDER — KETOROLAC TROMETHAMINE 30 MG/ML IJ SOLN
INTRAMUSCULAR | Status: DC | PRN
  Administered 2017-11-07: 30 mg via INTRAVENOUS

## 2017-11-07 MED ORDER — LACTATED RINGERS IV SOLN
INTRAVENOUS | Status: DC
  Administered 2017-11-07: 07:00:00 via INTRAVENOUS
  Filled 2017-11-07: qty 1000

## 2017-11-07 MED ORDER — PROPOFOL 10 MG/ML IV BOLUS
INTRAVENOUS | Status: DC | PRN
  Administered 2017-11-07: 300 mg via INTRAVENOUS

## 2017-11-07 MED ORDER — CEFAZOLIN SODIUM-DEXTROSE 2-4 GM/100ML-% IV SOLN
INTRAVENOUS | Status: AC
  Filled 2017-11-07: qty 100

## 2017-11-07 MED ORDER — PROPOFOL 10 MG/ML IV BOLUS
INTRAVENOUS | Status: AC
  Filled 2017-11-07: qty 40

## 2017-11-07 MED ORDER — MIDAZOLAM HCL 5 MG/5ML IJ SOLN
INTRAMUSCULAR | Status: DC | PRN
  Administered 2017-11-07: 2 mg via INTRAVENOUS

## 2017-11-07 MED ORDER — DEXAMETHASONE SODIUM PHOSPHATE 10 MG/ML IJ SOLN
INTRAMUSCULAR | Status: AC
  Filled 2017-11-07: qty 1

## 2017-11-07 MED ORDER — LIDOCAINE HCL 1 % IJ SOLN
INTRAMUSCULAR | Status: DC | PRN
  Administered 2017-11-07: 10 mL

## 2017-11-07 MED ORDER — ONDANSETRON HCL 4 MG/2ML IJ SOLN
INTRAMUSCULAR | Status: AC
  Filled 2017-11-07: qty 2

## 2017-11-07 MED ORDER — FENTANYL CITRATE (PF) 100 MCG/2ML IJ SOLN
INTRAMUSCULAR | Status: DC | PRN
  Administered 2017-11-07: 50 ug via INTRAVENOUS

## 2017-11-07 MED ORDER — METOCLOPRAMIDE HCL 5 MG/ML IJ SOLN
10.0000 mg | Freq: Once | INTRAMUSCULAR | Status: DC | PRN
  Filled 2017-11-07: qty 2

## 2017-11-07 MED ORDER — OXYCODONE HCL 5 MG PO TABS
ORAL_TABLET | ORAL | Status: AC
  Filled 2017-11-07: qty 1

## 2017-11-07 MED ORDER — DEXTROSE 5 % IV SOLN
3.0000 g | INTRAVENOUS | Status: AC
  Administered 2017-11-07: 3 g via INTRAVENOUS
  Filled 2017-11-07: qty 3000

## 2017-11-07 MED ORDER — DEXAMETHASONE SODIUM PHOSPHATE 10 MG/ML IJ SOLN
INTRAMUSCULAR | Status: DC | PRN
  Administered 2017-11-07: 10 mg via INTRAVENOUS

## 2017-11-07 MED ORDER — ONDANSETRON HCL 4 MG/2ML IJ SOLN
INTRAMUSCULAR | Status: DC | PRN
  Administered 2017-11-07: 4 mg via INTRAVENOUS

## 2017-11-07 MED ORDER — CEFAZOLIN SODIUM-DEXTROSE 1-4 GM/50ML-% IV SOLN
INTRAVENOUS | Status: AC
  Filled 2017-11-07: qty 50

## 2017-11-07 MED ORDER — OXYCODONE HCL 5 MG PO TABS
5.0000 mg | ORAL_TABLET | Freq: Once | ORAL | Status: AC
  Administered 2017-11-07: 5 mg via ORAL
  Filled 2017-11-07: qty 1

## 2017-11-07 MED ORDER — MEPERIDINE HCL 25 MG/ML IJ SOLN
6.2500 mg | INTRAMUSCULAR | Status: DC | PRN
  Filled 2017-11-07: qty 1

## 2017-11-07 SURGICAL SUPPLY — 17 items
BIPOLAR CUTTING LOOP 21FR (ELECTRODE)
CANISTER SUCT 3000ML PPV (MISCELLANEOUS) ×1 IMPLANT
CATH ROBINSON RED A/P 16FR (CATHETERS) ×3 IMPLANT
COUNTER NEEDLE 1200 MAGNETIC (NEEDLE) ×1 IMPLANT
DILATOR CANAL MILEX (MISCELLANEOUS) IMPLANT
GLOVE BIO SURGEON STRL SZ 6.5 (GLOVE) ×3 IMPLANT
GLOVE BIO SURGEONS STRL SZ 6.5 (GLOVE) ×1
GOWN STRL REUS W/TWL LRG LVL3 (GOWN DISPOSABLE) ×6 IMPLANT
KIT PROCEDURE FLUENT (KITS) ×3 IMPLANT
KIT TURNOVER CYSTO (KITS) ×3 IMPLANT
LOOP CUTTING BIPOLAR 21FR (ELECTRODE) IMPLANT
NDL SPNL 25GX3.5 QUINCKE BL (NEEDLE) ×1 IMPLANT
NEEDLE SPNL 25GX3.5 QUINCKE BL (NEEDLE) IMPLANT
PACK VAGINAL MINOR WOMEN LF (CUSTOM PROCEDURE TRAY) ×4 IMPLANT
PAD PREP 24X48 CUFFED NSTRL (MISCELLANEOUS) ×3 IMPLANT
TOWEL OR 17X24 6PK STRL BLUE (TOWEL DISPOSABLE) ×4 IMPLANT
WATER STERILE IRR 500ML POUR (IV SOLUTION) ×3 IMPLANT

## 2017-11-07 NOTE — Transfer of Care (Signed)
Immediate Anesthesia Transfer of Care Note  Patient: Madeline Held, MD  Procedure(s) Performed: DILATATION AND CURETTAGE /HYSTEROSCOPY (N/A )  Patient Location: PACU  Anesthesia Type:General  Level of Consciousness: awake, alert  and oriented  Airway & Oxygen Therapy: Patient Spontanous Breathing and Patient connected to nasal cannula oxygen  Post-op Assessment: Report given to RN  Post vital signs: Reviewed and stable  Last Vitals: 117/65 Vitals Value Taken Time  BP    Temp    Pulse 68 11/07/2017  8:02 AM  Resp 16 11/07/2017  8:02 AM  SpO2 100 % 11/07/2017  8:02 AM  Vitals shown include unvalidated device data.  Last Pain:  Vitals:   11/07/17 0619  TempSrc:   PainSc: 0-No pain      Patients Stated Pain Goal: 4 (73/41/93 7902)  Complications: No apparent anesthesia complications

## 2017-11-07 NOTE — Brief Op Note (Signed)
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  PRE-OPERATIVE DIAGNOSIS:   Abnormal uterine bleeding Endometrial polyp  POST-OPERATIVE DIAGNOSIS:   Same  PROCEDURE:  Procedure(s): DILATATION AND CURETTAGE /HYSTEROSCOPY (N/A)  Removal of endometrial polyps  SURGEON:  Surgeon(s) and Role:    * Dian Queen, MD - Primary  PHYSICIAN ASSISTANT:   ASSISTANTS: none   ANESTHESIA:   paracervical block and MAC  EBL:  20 mL   BLOOD ADMINISTERED:none  DRAINS: none   LOCAL MEDICATIONS USED:  LIDOCAINE   SPECIMEN:  Source of Specimen:  endometrial polyps  DISPOSITION OF SPECIMEN:  PATHOLOGY  COUNTS:  YES  TOURNIQUET:  * No tourniquets in log *  DICTATION: .Other Dictation: Dictation Number dictated  PLAN OF CARE: Discharge to home after PACU  PATIENT DISPOSITION:  PACU - hemodynamically stable.   Delay start of Pharmacological VTE agent (>24hrs) due to surgical blood loss or risk of bleeding: not applicable

## 2017-11-07 NOTE — Progress Notes (Signed)
H and P on the chart No changes Will proceed with D and C, Hysteroscopy Consent signed

## 2017-11-07 NOTE — Anesthesia Preprocedure Evaluation (Addendum)
Anesthesia Evaluation  Patient identified by MRN, date of birth, ID band Patient awake    Reviewed: Allergy & Precautions, NPO status , Patient's Chart, lab work & pertinent test results  History of Anesthesia Complications (+) PONV  Airway Mallampati: II  TM Distance: >3 FB Neck ROM: Full    Dental no notable dental hx.    Pulmonary asthma ,    Pulmonary exam normal breath sounds clear to auscultation       Cardiovascular negative cardio ROS Normal cardiovascular exam Rhythm:Regular Rate:Normal     Neuro/Psych negative neurological ROS  negative psych ROS   GI/Hepatic negative GI ROS, Neg liver ROS,   Endo/Other  Morbid obesity  Renal/GU negative Renal ROS  negative genitourinary   Musculoskeletal negative musculoskeletal ROS (+)   Abdominal   Peds negative pediatric ROS (+)  Hematology negative hematology ROS (+)   Anesthesia Other Findings   Reproductive/Obstetrics negative OB ROS                             Anesthesia Physical Anesthesia Plan  ASA: III  Anesthesia Plan: General   Post-op Pain Management:    Induction: Intravenous  PONV Risk Score and Plan: 4 or greater and Ondansetron, Dexamethasone, Midazolam and Scopolamine patch - Pre-op  Airway Management Planned: LMA  Additional Equipment:   Intra-op Plan:   Post-operative Plan: Extubation in OR  Informed Consent: I have reviewed the patients History and Physical, chart, labs and discussed the procedure including the risks, benefits and alternatives for the proposed anesthesia with the patient or authorized representative who has indicated his/her understanding and acceptance.   Dental advisory given  Plan Discussed with: CRNA  Anesthesia Plan Comments:       Anesthesia Quick Evaluation

## 2017-11-07 NOTE — Anesthesia Postprocedure Evaluation (Signed)
Anesthesia Post Note  Patient: Madeline Held, MD  Procedure(s) Performed: DILATATION AND CURETTAGE /HYSTEROSCOPY (N/A )     Patient location during evaluation: PACU Anesthesia Type: General Level of consciousness: awake and alert Pain management: pain level controlled Vital Signs Assessment: post-procedure vital signs reviewed and stable Respiratory status: spontaneous breathing, nonlabored ventilation, respiratory function stable and patient connected to nasal cannula oxygen Cardiovascular status: blood pressure returned to baseline and stable Postop Assessment: no apparent nausea or vomiting Anesthetic complications: no    Last Vitals:  Vitals:   11/07/17 0830 11/07/17 0915  BP: 119/77 (!) 147/80  Pulse: 64 67  Resp: 16 16  Temp:  36.6 C  SpO2: 100% 100%    Last Pain:  Vitals:   11/07/17 0902  TempSrc:   PainSc: 5                  Montez Hageman

## 2017-11-07 NOTE — Discharge Instructions (Addendum)
DISCHARGE INSTRUCTIONS: D&C / Hysteroscopy The following instructions have been prepared to help you care for yourself upon your return home.   **You may begin Ibuprofen/Motrin/Advil/Aleve after 1:35 pm today**  Personal hygiene:  Use sanitary pads for vaginal drainage, not tampons.  Shower the day after your procedure.  NO tub baths, pools or Jacuzzis for 2-3 weeks.  Wipe front to back after using the bathroom.  Activity and limitations:  Do NOT drive or operate any equipment for 24 hours. The effects of anesthesia are still present and drowsiness may result.  Do NOT rest in bed all day.  Walking is encouraged.  Walk up and down stairs slowly.  You may resume your normal activity in one to two days or as indicated by your physician.  Sexual activity: NO intercourse for at least 2 weeks after the procedure, or as indicated by your physician.  Diet: Eat a light meal as desired this evening. You may resume your usual diet tomorrow.  Return to work: You may resume your work activities in one to two days or as indicated by your doctor.  What to expect after your surgery: Expect to have vaginal bleeding/discharge for 2-3 days and spotting for up to 10 days. It is not unusual to have soreness for up to 1-2 weeks. You may have a slight burning sensation when you urinate for the first day. Mild cramps may continue for a couple of days. You may have a regular period in 2-6 weeks.  Call your doctor for any of the following:  Excessive vaginal bleeding, saturating and changing one pad every hour.  Inability to urinate 6 hours after discharge from hospital.  Pain not relieved by pain medication.  Fever of 100.4 F or greater.  Unusual vaginal discharge or odor. *Nausea and/or vomiting   Post Anesthesia Home Care Instructions  Activity: Get plenty of rest for the remainder of the day. A responsible adult should stay with you for 24 hours following the procedure.  For the next  24 hours, DO NOT: -Drive a car -Paediatric nurse -Drink alcoholic beverages -Take any medication unless instructed by your physician -Make any legal decisions or sign important papers.  Meals: Start with liquid foods such as gelatin or soup. Progress to regular foods as tolerated. Avoid greasy, spicy, heavy foods. If nausea and/or vomiting occur, drink only clear liquids until the nausea and/or vomiting subsides. Call your physician if vomiting continues.  Special Instructions/Symptoms: Your throat may feel dry or sore from the anesthesia or the breathing tube placed in your throat during surgery. If this causes discomfort, gargle with warm salt water. The discomfort should disappear within 24 hours.

## 2017-11-07 NOTE — Anesthesia Procedure Notes (Signed)
Procedure Name: LMA Insertion Date/Time: 11/07/2017 7:28 AM Performed by: Bonney Aid, CRNA Pre-anesthesia Checklist: Patient identified, Emergency Drugs available, Suction available and Patient being monitored Patient Re-evaluated:Patient Re-evaluated prior to induction Oxygen Delivery Method: Circle system utilized Preoxygenation: Pre-oxygenation with 100% oxygen Induction Type: IV induction Ventilation: Mask ventilation without difficulty LMA: LMA inserted LMA Size: 5.0 Number of attempts: 1 Airway Equipment and Method: Bite block Placement Confirmation: positive ETCO2 Dental Injury: Teeth and Oropharynx as per pre-operative assessment

## 2017-11-10 ENCOUNTER — Encounter (HOSPITAL_BASED_OUTPATIENT_CLINIC_OR_DEPARTMENT_OTHER): Payer: Self-pay | Admitting: Obstetrics and Gynecology

## 2017-11-11 NOTE — Op Note (Signed)
NAME: [name], [name] MEDICAL RECORD PX:[id number] ACCOUNT [id number] DATE OF BIRTH:Jul 16, 1965 FACILITY: [facility] LOCATION: [facility] PHYSICIAN:Aysha Livecchi Lynett Fish, MD  OPERATIVE REPORT  DATE OF PROCEDURE:  11/07/2017  PREOPERATIVE DIAGNOSES:  Abnormal uterine bleeding and endometrial polyp.  POSTOPERATIVE DIAGNOSES:  Abnormal uterine bleeding and endometrial polyp.  PROCEDURE:  Dilation and curettage, hysteroscopy and removal of endometrial polyp.  SURGEON:  Dian Queen, MD  ASSISTANT:  None.  ANESTHESIA:  Paracervical block with MAC.  ESTIMATED BLOOD LOSS:  20 mL.  COMPLICATIONS:  None.  PATHOLOGY:  Endometrial polyps.  DESCRIPTION OF PROCEDURE:  The patient was taken to the operating room after consent was obtained.  She was consented about the risk of the procedure.  She was then administered anesthesia.  She was prepped and draped and a timeout was performed in the  standard fashion.  Speculum was inserted.  The cervix was grasped with a tenaculum and a paracervical block was performed.  The cervical internal os was gently dilated using Pratt dilators and the diagnostic hysteroscope was inserted into the uterine  cavity with good visualization.  We could see the endometrium appeared normal except for 2 moderate sized polyps attached to the posterior wall of the uterus.  The hysteroscope was removed.  The uterus was curetted several times and the tissue was  removed with tissue forceps.  It appeared to be polypoid in nature.  The hysteroscope was reinserted and the uterine cavity appeared to be normal.  At the end of the procedure, all instruments were removed from the vagina.  All sponge, lap and instrument  counts were correct x2.  The patient went to recovery room in stable condition.  TN/NUANCE  D:11/11/2017 T:11/11/2017 JOB:[id number]

## 2018-02-13 ENCOUNTER — Telehealth: Payer: Self-pay

## 2018-02-13 ENCOUNTER — Other Ambulatory Visit: Payer: Self-pay | Admitting: Family Medicine

## 2018-02-13 MED ORDER — ONDANSETRON HCL 4 MG PO TABS: 4.0000 mg | ORAL_TABLET | Freq: Two times a day (BID) | ORAL | 0 refills | Status: DC

## 2018-02-13 MED ORDER — BALOXAVIR MARBOXIL(40 MG DOSE) 2 X 20 MG PO TBPK: 2.0000 | ORAL_TABLET | Freq: Once | ORAL | 0 refills | Status: AC

## 2018-02-13 MED ORDER — CIPROFLOXACIN HCL 500 MG PO TABS: 500.0000 mg | ORAL_TABLET | Freq: Two times a day (BID) | ORAL | 0 refills | Status: DC

## 2018-02-13 MED ORDER — SCOPOLAMINE 1 MG/3DAYS TD PT72: 1.0000 | MEDICATED_PATCH | TRANSDERMAL | 0 refills | Status: DC

## 2018-02-13 MED FILL — ONDANSETRON HCL 4 MG TABLET: 4 | 21 days supply | Qty: 18 | Fill #0

## 2018-02-13 MED FILL — CIPROFLOXACIN HCL 500 MG TA: 500 | 14 days supply | Qty: 28 | Fill #0

## 2018-02-13 MED FILL — SCOPOLAMINE 1 MG/3DAYS PT72: 1 | 30 days supply | Qty: 10 | Fill #0

## 2018-02-13 MED FILL — XOFLUZA 40 (2) MG TBPK: 2 X 40 | 1 days supply | Qty: 2 | Fill #0

## 2018-02-13 NOTE — Progress Notes (Signed)
Patient stated she needs medication to go out of town for motion sickness

## 2018-02-13 NOTE — Telephone Encounter (Signed)
PA initiated via Covermymeds; KEY: [id number]. Awaiting determination.

## 2018-02-13 NOTE — Telephone Encounter (Signed)
PA approved. Effective 02/13/2018 to 02/14/2019.

## 2018-02-24 ENCOUNTER — Ambulatory Visit (HOSPITAL_BASED_OUTPATIENT_CLINIC_OR_DEPARTMENT_OTHER)
Admission: RE | Admit: 2018-02-24 | Discharge: 2018-02-24 | Disposition: A | Discharge status: Home / Self Care | Payer: BC Managed Care – PPO | Source: Ambulatory Visit | Attending: Family Medicine | Admitting: Family Medicine

## 2018-02-24 ENCOUNTER — Encounter: Payer: Self-pay | Admitting: Family Medicine

## 2018-02-24 ENCOUNTER — Ambulatory Visit (INDEPENDENT_AMBULATORY_CARE_PROVIDER_SITE_OTHER): Payer: BC Managed Care – PPO | Admitting: Family Medicine

## 2018-02-24 VITALS — BP 124/82 | HR 92 | Temp 97.9°F | Resp 18

## 2018-02-24 DIAGNOSIS — M25562 Pain in left knee: Secondary | ICD-10-CM

## 2018-02-24 DIAGNOSIS — M25561 Pain in right knee: Secondary | ICD-10-CM

## 2018-02-24 DIAGNOSIS — S93491A Sprain of other ligament of right ankle, initial encounter: Secondary | ICD-10-CM | POA: Diagnosis not present

## 2018-02-24 DIAGNOSIS — S93401A Sprain of unspecified ligament of right ankle, initial encounter: Secondary | ICD-10-CM | POA: Insufficient documentation

## 2018-02-24 MED ORDER — METHOCARBAMOL 500 MG PO TABS: 500.0000 mg | ORAL_TABLET | Freq: Three times a day (TID) | ORAL | 1 refills | Status: DC | PRN

## 2018-02-24 MED FILL — METHOCARBAMOL 500 MG TABLET: 500 | 14 days supply | Qty: 40 | Fill #0

## 2018-02-24 NOTE — Patient Instructions (Signed)
Acute Knee Pain, Adult  Acute knee pain is sudden and may be caused by damage, swelling, or irritation of the muscles and tissues that support your knee. The injury may result from:   A fall.   An injury to your knee from twisting motions.   A hit to the knee.   Infection.  Acute knee pain may go away on its own with time and rest. If it does not, your health care provider may order tests to find the cause of the pain. These may include:   Imaging tests, such as an X-ray, MRI, or ultrasound.   Joint aspiration. In this test, fluid is removed from the knee.   Arthroscopy. In this test, a lighted tube is inserted into the knee and an image is projected onto a TV screen.   Biopsy. In this test, a sample of tissue is removed from the body and studied under a microscope.  Follow these instructions at home:  Pay attention to any changes in your symptoms. Take these actions to relieve your pain.  If you have a knee sleeve or brace:     Wear the sleeve or brace as told by your health care provider. Remove it only as told by your health care provider.   Loosen the sleeve or brace if your toes tingle, become numb, or turn cold and blue.   Keep the sleeve or brace clean.   If the sleeve or brace is not waterproof:  ? Do not let it get wet.  ? Cover it with a watertight covering when you take a bath or shower.  Activity   Rest your knee.   Do not do things that cause pain or make pain worse.   Avoid high-impact activities or exercises, such as running, jumping rope, or doing jumping jacks.   Work with a physical therapist to make a safe exercise program, as recommended by your health care provider. Do exercises as told by your physical therapist.  Managing pain, stiffness, and swelling     If directed, put ice on the knee:  ? Put ice in a plastic bag.  ? Place a towel between your skin and the bag.  ? Leave the ice on for 20 minutes, 2-3 times a day.   If directed, use an elastic bandage to put pressure  (compression) on your injured knee. This may control swelling, give support, and help with discomfort.  General instructions   Take over-the-counter and prescription medicines only as told by your health care provider.   Raise (elevate) your knee above the level of your heart when you are sitting or lying down.   Sleep with a pillow under your knee.   Do not use any products that contain nicotine or tobacco, such as cigarettes, e-cigarettes, and chewing tobacco. These can delay healing. If you need help quitting, ask your health care provider.   If you are overweight, work with your health care provider and a dietitian to set a weight-loss goal that is healthy and reasonable for you. Extra weight can put pressure on your knee.   Keep all follow-up visits as told by your health care provider. This is important.  Contact a health care provider if:   Your knee pain continues, changes, or gets worse.   You have a fever along with knee pain.   Your knee feels warm to the touch.   Your knee buckles or locks up.  Get help right away if:   Your knee swells,   and the swelling becomes worse.   You cannot move your knee.   You have severe pain in your knee.  Summary   Acute knee pain can be caused by a fall, an injury, an infection, or damage, swelling, or irritation of the tissues that support your knee.   Your health care provider may perform tests to find out the cause of the pain.   Pay attention to any changes in your symptoms. Relieve your pain with rest, medicines, light activity, and use of ice.   Get help if your pain continues or becomes worse, your knee swells, or you cannot move your knee.  This information is not intended to replace advice given to you by your health care provider. Make sure you discuss any questions you have with your health care provider.  Document Released: 10/28/2006 Document Revised: 06/12/2017 Document Reviewed: 06/12/2017  Elsevier Interactive Patient Education  2019  [employer]

## 2018-02-24 NOTE — Progress Notes (Signed)
Subjective:    Patient ID: Madeline Held, MD, female    DOB: November 13, 1965, 53 y.o.   MRN: [id number]  Chief Complaint  Patient presents with  . Fall    Left knee worst, right knee and  right ankle     HPI Patient is in today for evaluation of knee and ankle pain after a fall on vacation on 02/15/2018. The knee buckled under her and she fell quickly. She does not remember if she actually hit the knee but it has been painful and swollen since then she was on a cruise and she was able to use a wheelchair and it has improved some but still hurts. It is worse with ambulation, twisting, weight baring. This has also made her right knee worse as she has favored her left knee. She has had trouble with right knee for a long time and it is now worse. She has also noted she twisted her right ankle in the same fall and it has been sore laterally since then. Today it is actually much better for the first time. She has been trying to stay off it because weight bearing was painful. Minimal swelling is noted. Denies CP/palp/SOB/HA/congestion/fevers/GI. Taking meds as prescribed.   Past Medical History:  Diagnosis Date  . Anemia   . Asthma    exercise induced  . Clot    superficial clot in right leg - baby aspirin  . Fibroids   . PONV (postoperative nausea and vomiting)     Past Surgical History:  Procedure Laterality Date  . APPENDECTOMY    . BACK SURGERY     x 3 - lumbar fusion on one  . BREAST SURGERY     reduction  . HYSTEROSCOPY W/D&C N/A 11/07/2017   Procedure: DILATATION AND CURETTAGE /HYSTEROSCOPY;  Surgeon: Dian Queen, MD;  Location: [facility];  Service: Gynecology;  Laterality: N/A;  . INNER EAR SURGERY     tubes in ears as a child  . LAPAROSCOPIC GASTRIC BYPASS    . 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 MYOMECTOMY N/A 10/18/2013   Procedure: ABDOMINAL MYOMECTOMY;  Surgeon: Cyril Mourning, MD;  Location: [facility];  Service: Gynecology;  Laterality:  N/A;  . REDUCTION MAMMAPLASTY Bilateral    1995  . TONSILLECTOMY  1972    Family History  Problem Relation Age of Onset  . Hypertension Mother   . Hyperlipidemia Father   . COPD Father     Social History   Socioeconomic History  . Marital status: Married    Spouse name: Not on file  . Number of children: Not on file  . Years of education: Not on file  . Highest education level: Not on file  Occupational History  . Not on file  Social Needs  . Financial resource strain: Not on file  . Food insecurity:    Worry: Not on file    Inability: Not on file  . Transportation needs:    Medical: Not on file    Non-medical: Not on file  Tobacco Use  . Smoking status: Never Smoker  . Smokeless tobacco: Never Used  Substance and Sexual Activity  . Alcohol use: Yes    Alcohol/week: 0.0 standard drinks    Comment: occ- wine  . Drug use: No  . Sexual activity: Yes    Birth control/protection: Pill  Lifestyle  . Physical activity:    Days per week: Not on file  Minutes per session: Not on file  . Stress: Not on file  Relationships  . Social connections:    Talks on phone: Not on file    Gets together: Not on file    Attends religious service: Not on file    Active member of club or organization: Not on file    Attends meetings of clubs or organizations: Not on file    Relationship status: Not on file  . Intimate partner violence:    Fear of current or ex partner: Not on file    Emotionally abused: Not on file    Physically abused: Not on file    Forced sexual activity: Not on file  Other Topics Concern  . Not on file  Social History Narrative   Drinks to 1-2 cans of soda daily.      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    Outpatient Medications Prior to Visit  Medication Sig Dispense Refill  . hydrochlorothiazide (HYDRODIURIL) 25 MG tablet Take 1 tablet (25 mg total) by mouth daily as needed. edema 90 tablet 3  . ondansetron (ZOFRAN) 4 MG tablet Take 1 tablet (4 mg total) by  mouth 2 (two) times daily. 30 tablet 0  . ciprofloxacin (CIPRO) 500 MG tablet Take 1 tablet (500 mg total) by mouth 2 (two) times daily. 28 tablet 0  . scopolamine (TRANSDERM-SCOP) 1 MG/3DAYS Place 1 patch (1.5 mg total) onto the skin every 3 (three) days. 10 patch 0   No facility-administered medications prior to visit.     Allergies  Allergen Reactions  . Adhesive [Tape]     blister  . Levofloxacin     REACTION: abdiminal pain  High Dose 750 mg, ok with 500 mg dose    Review of Systems  Constitutional: Negative for fever.  Respiratory: Negative for cough and sputum production.   Cardiovascular: Negative for chest pain.  Musculoskeletal: Positive for joint pain and myalgias.  Skin: Negative for rash.       Objective:    Physical Exam Vitals signs and nursing note reviewed.  Constitutional:      General: She is not in acute distress.    Appearance: She is well-developed.  HENT:     Head: Normocephalic and atraumatic.     Nose: Nose normal.  Eyes:     General:        Right eye: No discharge.        Left eye: No discharge.  Neck:     Musculoskeletal: Normal range of motion and neck supple.  Cardiovascular:     Rate and Rhythm: Normal rate and regular rhythm.     Heart sounds: No murmur.  Pulmonary:     Effort: Pulmonary effort is normal.     Breath sounds: Normal breath sounds.  Abdominal:     General: Bowel sounds are normal.     Palpations: Abdomen is soft.     Tenderness: There is no abdominal tenderness.  Musculoskeletal:        General: Swelling and tenderness present.     Comments: Left knee tender and swollen anteriorly at medial, distal aspect no redness. No joint instability  Skin:    General: Skin is warm and dry.  Neurological:     Mental Status: She is alert and oriented to person, place, and time.     BP 124/82 (BP Location: Left Arm, Patient Position: Sitting, Cuff Size: Normal)   Pulse 92   Temp 97.9 F (36.6 C) (Oral)   Resp  18   SpO2 98%   Wt Readings from Last 3 Encounters:  11/07/17 274 lb 8 oz (124.5 kg)  08/07/16 260 lb 8 oz (118.2 kg)  04/19/15 255 lb (115.7 kg)     Lab Results  Component Value Date   WBC 8.4 11/07/2017   HGB 11.6 (L) 11/07/2017   HCT 34.0 (L) 11/07/2017   PLT 336 11/07/2017   GLUCOSE 91 11/07/2017   CHOL 265 (H) 04/28/2017   TRIG 104 04/28/2017   HDL 72 04/28/2017   LDLDIRECT 164.6 04/09/2012   LDLCALC 170 (H) 04/28/2017   ALT 12 04/28/2017   AST 15 04/28/2017   NA 139 11/07/2017   K 3.7 11/07/2017   CL 107 11/07/2017   CREATININE 0.70 11/07/2017   BUN 13 11/07/2017   CO2 23 04/28/2017   TSH 2.70 04/28/2017    Lab Results  Component Value Date   TSH 2.70 04/28/2017   Lab Results  Component Value Date   WBC 8.4 11/07/2017   HGB 11.6 (L) 11/07/2017   HCT 34.0 (L) 11/07/2017   MCV 94.5 11/07/2017   PLT 336 11/07/2017   Lab Results  Component Value Date   NA 139 11/07/2017   K 3.7 11/07/2017   CO2 23 04/28/2017   GLUCOSE 91 11/07/2017   BUN 13 11/07/2017   CREATININE 0.70 11/07/2017   BILITOT 0.7 04/28/2017   ALKPHOS 51 08/07/2016   AST 15 04/28/2017   ALT 12 04/28/2017   PROT 6.9 04/28/2017   ALBUMIN 4.0 08/07/2016   CALCIUM 9.0 04/28/2017   ANIONGAP 13 10/19/2013   GFR 100.66 08/07/2016   Lab Results  Component Value Date   CHOL 265 (H) 04/28/2017   Lab Results  Component Value Date   HDL 72 04/28/2017   Lab Results  Component Value Date   LDLCALC 170 (H) 04/28/2017   Lab Results  Component Value Date   TRIG 104 04/28/2017   Lab Results  Component Value Date   CHOLHDL 3.7 04/28/2017   No results found for: HGBA1C     Assessment & Plan:   Problem List Items Addressed This Visit    Knee pain, bilateral - Primary    Left knee buckled and she tripped while she was on vacation and it continues to hurt a week later. Has caused the right knee to hurt more as she has favored the left knee this past week. She has been using a wheelchair a good bit this  last week and that has helped. Feels better with ice and when it is stretched. Pain is worse in medial aspect. Twisting and major position changes make it worse. Is keeping it up at night due to constant ache. Robaxin has been helpful so will refill now and send for xray. Has seen Dr Maureen Ralphs in past will set her up with an appointment and she is sent for xray now. Encouraged ice, Ibuprofen 400 mg as needed. Elevate when able and rest as much as possible      Relevant Orders   DG Knee Complete 4 Views Left   Ambulatory referral to Orthopedic Surgery   Right ankle sprain    Injured during fall but improving. Now only mildly tender above lateral malleolus. Ice rest elevation and compress if swelling worsens.          I have discontinued Madeline Held, MD's ciprofloxacin and scopolamine. I am also having her start on methocarbamol. Additionally, I am having her maintain her hydrochlorothiazide and ondansetron.  Meds  ordered this encounter  Medications  . methocarbamol (ROBAXIN) 500 MG tablet    Sig: Take 1 tablet (500 mg total) by mouth every 8 (eight) hours as needed for muscle spasms.    Dispense:  40 tablet    Refill:  1     Penni Homans, MD

## 2018-02-24 NOTE — Assessment & Plan Note (Signed)
Left knee buckled and she tripped while she was on vacation and it continues to hurt a week later. Has caused the right knee to hurt more as she has favored the left knee this past week. She has been using a wheelchair a good bit this last week and that has helped. Feels better with ice and when it is stretched. Pain is worse in medial aspect. Twisting and major position changes make it worse. Is keeping it up at night due to constant ache. Robaxin has been helpful so will refill now and send for xray. Has seen Dr Maureen Ralphs in past will set her up with an appointment and she is sent for xray now. Encouraged ice, Ibuprofen 400 mg as needed. Elevate when able and rest as much as possible

## 2018-02-24 NOTE — Assessment & Plan Note (Signed)
Injured during fall but improving. Now only mildly tender above lateral malleolus. Ice rest elevation and compress if swelling worsens.

## 2018-03-16 ENCOUNTER — Telehealth: Payer: Self-pay

## 2018-03-16 MED ORDER — METHOCARBAMOL 500 MG PO TABS: 500.0000 mg | ORAL_TABLET | Freq: Three times a day (TID) | ORAL | 1 refills | Status: DC | PRN

## 2018-03-16 MED FILL — METHOCARBAMOL 500 MG TABLET: 500 | 14 days supply | Qty: 40 | Fill #0

## 2018-03-16 NOTE — Telephone Encounter (Signed)
Patient called needing a refill on her Robaxin.  Medication sent in by verbal from Dr. Charlett Blake

## 2018-03-16 NOTE — Telephone Encounter (Signed)
agreed

## 2018-03-25 ENCOUNTER — Other Ambulatory Visit: Payer: Self-pay | Admitting: Family Medicine

## 2018-03-25 ENCOUNTER — Other Ambulatory Visit: Payer: Self-pay | Admitting: Obstetrics and Gynecology

## 2018-03-25 DIAGNOSIS — Z1231 Encounter for screening mammogram for malignant neoplasm of breast: Secondary | ICD-10-CM

## 2018-04-22 ENCOUNTER — Ambulatory Visit: Payer: BC Managed Care – PPO

## 2018-06-10 ENCOUNTER — Ambulatory Visit
Admission: RE | Admit: 2018-06-10 | Discharge: 2018-06-10 | Disposition: A | Discharge status: Home / Self Care | Payer: BC Managed Care – PPO | Source: Ambulatory Visit | Attending: Obstetrics and Gynecology | Admitting: Obstetrics and Gynecology

## 2018-06-10 ENCOUNTER — Other Ambulatory Visit: Payer: Self-pay

## 2018-06-10 ENCOUNTER — Encounter: Payer: Self-pay | Admitting: Family Medicine

## 2018-06-10 DIAGNOSIS — Z1211 Encounter for screening for malignant neoplasm of colon: Secondary | ICD-10-CM

## 2018-06-10 DIAGNOSIS — Z1231 Encounter for screening mammogram for malignant neoplasm of breast: Secondary | ICD-10-CM

## 2018-06-11 DIAGNOSIS — Z1211 Encounter for screening for malignant neoplasm of colon: Secondary | ICD-10-CM | POA: Insufficient documentation

## 2018-06-11 NOTE — Telephone Encounter (Signed)
Will route to [facility]

## 2018-06-15 ENCOUNTER — Encounter: Payer: Self-pay | Admitting: Gastroenterology

## 2018-07-01 ENCOUNTER — Other Ambulatory Visit: Payer: Self-pay

## 2018-07-01 ENCOUNTER — Ambulatory Visit: Payer: BC Managed Care – PPO | Admitting: *Deleted

## 2018-07-01 VITALS — Ht 65.5 in | Wt 254.4 lb

## 2018-07-01 DIAGNOSIS — Z1211 Encounter for screening for malignant neoplasm of colon: Secondary | ICD-10-CM

## 2018-07-01 MED ORDER — NA SULFATE-K SULFATE-MG SULF 17.5-3.13-1.6 GM/177ML PO SOLN: ORAL | 0 refills | Status: DC

## 2018-07-01 NOTE — Progress Notes (Signed)
Patient's pre-visit was done today over the phone with the patient due to COVID-19 pandemic. Name,DOB and address verified. Insurance verified. Packet of Prep instructions mailed to patient including copy of a consent form and pre-procedure patient acknowledgement form-pt is aware. Suprep Coupon included. Patient understands to call us back with any questions or concerns. Patient denies any allergies to eggs or soy. Patient denies any problems with anesthesia/sedation. Patient denies any oxygen use at home. Patient denies taking any diet/weight loss medications or blood thinners. EMMI education assisgned to patient on colonoscopy, this was explained and instructions given to patient. Pt is aware that care partner will wait in the car during parking lot; if they feel like they will be too hot to wait in the car; they may wait in the lobby.  We want them to wear a mask (we do not have any that we can provide them), practice social distancing, and we will check their temperatures when they get here.  I did remind patient that their care partner needs to stay in the parking lot the entire time. Pt will wear mask into building

## 2018-07-07 ENCOUNTER — Other Ambulatory Visit: Payer: Self-pay | Admitting: *Deleted

## 2018-07-07 MED ORDER — HYDROCHLOROTHIAZIDE 25 MG PO TABS: 25.0000 mg | ORAL_TABLET | Freq: Every day | ORAL | 1 refills | Status: DC | PRN

## 2018-07-07 NOTE — Telephone Encounter (Signed)
Pt hasn't been seen since 2018 and no future appts., please advise   Also Rx hasn't been filled since 0/25/18  [facility]

## 2018-07-15 ENCOUNTER — Other Ambulatory Visit: Payer: Self-pay

## 2018-07-15 ENCOUNTER — Ambulatory Visit (AMBULATORY_SURGERY_CENTER): Payer: BC Managed Care – PPO | Admitting: Gastroenterology

## 2018-07-15 ENCOUNTER — Encounter: Payer: Self-pay | Admitting: Gastroenterology

## 2018-07-15 VITALS — BP 113/66 | HR 57 | Temp 98.1°F | Resp 14 | Ht 65.0 in | Wt 254.0 lb

## 2018-07-15 DIAGNOSIS — Z1211 Encounter for screening for malignant neoplasm of colon: Secondary | ICD-10-CM

## 2018-07-15 MED ORDER — CIPROFLOXACIN HCL 500 MG PO TABS: 500.0000 mg | ORAL_TABLET | Freq: Two times a day (BID) | ORAL | 0 refills | Status: DC

## 2018-07-15 MED ORDER — METRONIDAZOLE 500 MG PO TABS: 500.0000 mg | ORAL_TABLET | Freq: Three times a day (TID) | ORAL | 0 refills | Status: DC

## 2018-07-15 MED ORDER — SODIUM CHLORIDE 0.9 % IV SOLN: 500.0000 mL | Freq: Once | INTRAVENOUS | Status: DC

## 2018-07-15 NOTE — Progress Notes (Signed)
A and O x3. Report to RN. Tolerated MAC anesthesia well.

## 2018-07-15 NOTE — Patient Instructions (Signed)
No aspirin, ibuprofen, naproxen or other non steroidal anti inflammatory drugs for 2 weeks post procedure.   YOU HAD AN ENDOSCOPIC PROCEDURE TODAY AT [facility]:   Refer to the procedure report that was given to you for any specific questions about what was found during the examination.  If the procedure report does not answer your questions, please call your gastroenterologist to clarify.  If you requested that your care partner not be given the details of your procedure findings, then the procedure report has been included in a sealed envelope for you to review at your convenience later.  YOU SHOULD EXPECT: Some feelings of bloating in the abdomen. Passage of more gas than usual.  Walking can help get rid of the air that was put into your GI tract during the procedure and reduce the bloating. If you had a lower endoscopy (such as a colonoscopy or flexible sigmoidoscopy) you may notice spotting of blood in your stool or on the toilet paper. If you underwent a bowel prep for your procedure, you may not have a normal bowel movement for a few days.  Please Note:  You might notice some irritation and congestion in your nose or some drainage.  This is from the oxygen used during your procedure.  There is no need for concern and it should clear up in a day or so.  SYMPTOMS TO REPORT IMMEDIATELY:   Following lower endoscopy (colonoscopy or flexible sigmoidoscopy):  Excessive amounts of blood in the stool  Significant tenderness or worsening of abdominal pains  Swelling of the abdomen that is new, acute  Fever of 100F or higher   For urgent or emergent issues, a gastroenterologist can be reached at any hour by calling [phone].   DIET:  We do recommend a small meal at first, but then you may proceed to your regular diet.  Drink plenty of fluids but you should avoid alcoholic beverages for 24 hours.  ACTIVITY:  You should plan to take it easy for the rest of today and you  should NOT DRIVE or use heavy machinery until tomorrow (because of the sedation medicines used during the test).    FOLLOW UP: Our staff will call the number listed on your records 48-72 hours following your procedure to check on you and address any questions or concerns that you may have regarding the information given to you following your procedure. If we do not reach you, we will leave a message.  We will attempt to reach you two times.  During this call, we will ask if you have developed any symptoms of COVID 19. If you develop any symptoms (ie: fever, flu-like symptoms, shortness of breath, cough etc.) before then, please call [phone].  If you test positive for Covid 19 in the 2 weeks post procedure, please call and report this information to [place].    If any biopsies were taken you will be contacted by phone or by letter within the next 1-3 weeks.  Please call us at [phone] if you have not heard about the biopsies in 3 weeks.    SIGNATURES/CONFIDENTIALITY: You and/or your care partner have signed paperwork which will be entered into your electronic medical record.  These signatures attest to the fact that that the information above on your After Visit Summary has been reviewed and is understood.  Full responsibility of the confidentiality of this discharge information lies with you and/or your care-partner.

## 2018-07-15 NOTE — Progress Notes (Signed)
Pt's states no medical or surgical changes since previsit or office visit. 

## 2018-07-15 NOTE — Op Note (Signed)
[name] Procedure Date: 07/15/2018 10:31 AM MRN: [id number] Endoscopist: Mauri Pole , MD Age: 53 Referring MD:  Date of Birth: Jun 05, 1965 Gender: Female Account #: [id number] Procedure:                Colonoscopy Indications:              Screening for colorectal malignant neoplasm Medicines:                Monitored Anesthesia Care Procedure:                Pre-Anesthesia Assessment:                           - Prior to the procedure, a History and Physical                            was performed, and patient medications and                            allergies were reviewed. The patient's tolerance of                            previous anesthesia was also reviewed. The risks                            and benefits of the procedure and the sedation                            options and risks were discussed with the patient.                            All questions were answered, and informed consent                            was obtained. Prior Anticoagulants: The patient has                            taken no previous anticoagulant or antiplatelet                            agents. ASA Grade Assessment: II - A patient with                            mild systemic disease. After reviewing the risks                            and benefits, the patient was deemed in                            satisfactory condition to undergo the procedure.                           After obtaining informed consent, the colonoscope  was passed under direct vision. Throughout the                            procedure, the patient's blood pressure, pulse, and                            oxygen saturations were monitored continuously. The                            Colonoscope was introduced through the anus and                            advanced to the the cecum, identified by                            appendiceal  orifice and ileocecal valve. The                            colonoscopy was performed without difficulty. The                            patient tolerated the procedure well. The quality                            of the bowel preparation was excellent. The                            terminal ileum, ileocecal valve, appendiceal                            orifice, and rectum were photographed. Scope In: 10:39:03 AM Scope Out: 10:55:13 AM Scope Withdrawal Time: 0 hours 11 minutes 4 seconds  Total Procedure Duration: 0 hours 16 minutes 10 seconds  Findings:                 The perianal and digital rectal examinations were                            normal.                           Scattered small-mouthed diverticula were found in                            the sigmoid colon, descending colon, transverse                            colon and ascending colon.                           A single localized ?erosion/<106mm inflammed mucosa                            was found in the sigmoid colon adjacent to multiple  diverticuli, possible inverted diverticula.                           The exam was otherwise without abnormality. Complications:            No immediate complications. Estimated Blood Loss:     Estimated blood loss was minimal. Impression:               - Mild diverticulosis in the sigmoid colon, in the                            descending colon, in the transverse colon and in                            the ascending colon.                           - A single erosion in the sigmoid colon.                           - The examination was otherwise normal.                           - No specimens collected. Recommendation:           - Patient has a contact number available for                            emergencies. The signs and symptoms of potential                            delayed complications were discussed with the                            patient.  Return to normal activities tomorrow.                            Written discharge instructions were provided to the                            patient.                           - Resume previous diet.                           - Continue present medications.                           - No aspirin, ibuprofen, naproxen, or other                            non-steroidal anti-inflammatory drugs for 2 weeks.                           - Repeat colonoscopy in 10 years for screening  purposes. Mauri Pole, MD 07/15/2018 11:03:53 AM This report has been signed electronically.

## 2018-07-20 ENCOUNTER — Telehealth: Payer: Self-pay | Admitting: *Deleted

## 2018-07-20 NOTE — Telephone Encounter (Signed)
Message left

## 2018-09-18 MED FILL — METHOCARBAMOL 500 MG TABS: 500 | 14 days supply | Qty: 40 | Fill #1

## 2018-10-13 ENCOUNTER — Other Ambulatory Visit: Payer: Self-pay | Admitting: Occupational Medicine

## 2018-10-14 LAB — COMPLETE METABOLIC PANEL WITH GFR
AG Ratio: 1.8 (calc) (ref 1.0–2.5)
ALT: 17 U/L (ref 6–29)
AST: 19 U/L (ref 10–35)
Albumin: 4.2 g/dL (ref 3.6–5.1)
Alkaline phosphatase (APISO): 53 U/L (ref 37–153)
BUN: 18 mg/dL (ref 7–25)
CO2: 21 mmol/L (ref 20–32)
Calcium: 9.3 mg/dL (ref 8.6–10.4)
Chloride: 100 mmol/L (ref 98–110)
Creat: 0.92 mg/dL (ref 0.50–1.05)
GFR, Est African American: 83 mL/min/{1.73_m2} (ref >=60)
GFR, Est Non African American: 72 mL/min/{1.73_m2} (ref >=60)
Globulin: 2.4 g/dL (calc) (ref 1.9–3.7)
Glucose, Bld: 112 mg/dL — ABNORMAL HIGH (ref 65–99)
Potassium: 4.2 mmol/L (ref 3.5–5.3)
Sodium: 138 mmol/L (ref 135–146)
Total Bilirubin: 0.8 mg/dL (ref 0.2–1.2)
Total Protein: 6.6 g/dL (ref 6.1–8.1)

## 2018-10-14 LAB — CBC WITH DIFFERENTIAL/PLATELET
Absolute Monocytes: 719 cells/uL (ref 200–950)
Basophils Absolute: 40 cells/uL (ref 0–200)
Basophils Relative: 0.6 %
Eosinophils Absolute: 119 cells/uL (ref 15–500)
Eosinophils Relative: 1.8 %
HCT: 39.2 % (ref 35.0–45.0)
Hemoglobin: 13 g/dL (ref 11.7–15.5)
Lymphs Abs: 2224 cells/uL (ref 850–3900)
MCH: 30.7 pg (ref 27.0–33.0)
MCHC: 33.2 g/dL (ref 32.0–36.0)
MCV: 92.7 fL (ref 80.0–100.0)
MPV: 11.1 fL (ref 7.5–12.5)
Monocytes Relative: 10.9 %
Neutro Abs: 3498 cells/uL (ref 1500–7800)
Neutrophils Relative %: 53 %
Platelets: 382 10*3/uL (ref 140–400)
RBC: 4.23 10*6/uL (ref 3.80–5.10)
RDW: 14 % (ref 11.0–15.0)
Total Lymphocyte: 33.7 %
WBC: 6.6 10*3/uL (ref 3.8–10.8)

## 2018-10-14 LAB — LIPID PANEL
Cholesterol: 260 mg/dL — ABNORMAL HIGH (ref <200)
HDL: 57 mg/dL (ref >=50)
LDL Cholesterol (Calc): 177 mg/dL (calc) — ABNORMAL HIGH
Non-HDL Cholesterol (Calc): 203 mg/dL (calc) — ABNORMAL HIGH (ref <130)
Total CHOL/HDL Ratio: 4.6 (calc) (ref <5.0)
Triglycerides: 124 mg/dL (ref <150)

## 2018-10-14 LAB — TSH: TSH: 2.97 mIU/L

## 2018-10-22 NOTE — Progress Notes (Signed)
Spoke with beverly harrelson rn , patient may have covid test Thursday 230 pm per beverly harrelson

## 2018-10-27 ENCOUNTER — Other Ambulatory Visit (HOSPITAL_COMMUNITY): Payer: BC Managed Care – PPO

## 2018-10-29 ENCOUNTER — Encounter (HOSPITAL_BASED_OUTPATIENT_CLINIC_OR_DEPARTMENT_OTHER): Payer: Self-pay | Admitting: *Deleted

## 2018-10-29 ENCOUNTER — Other Ambulatory Visit (HOSPITAL_COMMUNITY)
Admission: RE | Admit: 2018-10-29 | Discharge: 2018-10-29 | Disposition: A | Discharge status: Home / Self Care | Payer: BC Managed Care – PPO | Source: Ambulatory Visit | Attending: Obstetrics and Gynecology | Admitting: Obstetrics and Gynecology

## 2018-10-29 ENCOUNTER — Other Ambulatory Visit: Payer: Self-pay

## 2018-10-29 DIAGNOSIS — Z20828 Contact with and (suspected) exposure to other viral communicable diseases: Secondary | ICD-10-CM | POA: Insufficient documentation

## 2018-10-29 DIAGNOSIS — Z01812 Encounter for preprocedural laboratory examination: Secondary | ICD-10-CM | POA: Diagnosis not present

## 2018-10-29 LAB — SARS CORONAVIRUS 2 (TAT 6-24 HRS): SARS Coronavirus 2: NEGATIVE

## 2018-10-29 NOTE — Progress Notes (Addendum)
Spoke w/ via phone for pre-op interview--- PT Lab needs dos---- Istat 8 , urine preg.,  t&s COVID test ------ today @1430  10-29-2018 Arrive at ------- 0530 NPO after ------ [place] Medications to take morning of surgery ----- NONE Diabetic medication ----- n/a Patient Special Instructions ----- n/a Pre-Op special Istructions ----- n/a Patient verbalized understanding of instructions that were given at this phone interview. Patient denies shortness of breath, chest pain, fever, cough a this phone interview.

## 2018-10-30 ENCOUNTER — Encounter (HOSPITAL_BASED_OUTPATIENT_CLINIC_OR_DEPARTMENT_OTHER): Payer: Self-pay | Admitting: *Deleted

## 2018-10-30 ENCOUNTER — Ambulatory Visit (HOSPITAL_BASED_OUTPATIENT_CLINIC_OR_DEPARTMENT_OTHER)
Admission: RE | Admit: 2018-10-30 | Discharge: 2018-10-30 | Disposition: A | Discharge status: Home / Self Care | Payer: BC Managed Care – PPO | Attending: Obstetrics and Gynecology | Admitting: Obstetrics and Gynecology

## 2018-10-30 ENCOUNTER — Ambulatory Visit (HOSPITAL_BASED_OUTPATIENT_CLINIC_OR_DEPARTMENT_OTHER): Payer: BC Managed Care – PPO | Admitting: Anesthesiology

## 2018-10-30 ENCOUNTER — Encounter (HOSPITAL_BASED_OUTPATIENT_CLINIC_OR_DEPARTMENT_OTHER)
Admission: RE | Disposition: A | Discharge status: Home / Self Care | Payer: Self-pay | Source: Home / Self Care | Attending: Obstetrics and Gynecology

## 2018-10-30 DIAGNOSIS — Z79899 Other long term (current) drug therapy: Secondary | ICD-10-CM | POA: Insufficient documentation

## 2018-10-30 DIAGNOSIS — D259 Leiomyoma of uterus, unspecified: Secondary | ICD-10-CM | POA: Diagnosis not present

## 2018-10-30 DIAGNOSIS — N840 Polyp of corpus uteri: Secondary | ICD-10-CM | POA: Insufficient documentation

## 2018-10-30 DIAGNOSIS — E785 Hyperlipidemia, unspecified: Secondary | ICD-10-CM | POA: Insufficient documentation

## 2018-10-30 DIAGNOSIS — Z8672 Personal history of thrombophlebitis: Secondary | ICD-10-CM | POA: Diagnosis not present

## 2018-10-30 DIAGNOSIS — Z888 Allergy status to other drugs, medicaments and biological substances status: Secondary | ICD-10-CM | POA: Insufficient documentation

## 2018-10-30 DIAGNOSIS — Z881 Allergy status to other antibiotic agents status: Secondary | ICD-10-CM | POA: Diagnosis not present

## 2018-10-30 DIAGNOSIS — Z9884 Bariatric surgery status: Secondary | ICD-10-CM | POA: Insufficient documentation

## 2018-10-30 DIAGNOSIS — N939 Abnormal uterine and vaginal bleeding, unspecified: Secondary | ICD-10-CM | POA: Insufficient documentation

## 2018-10-30 HISTORY — DX: Polyp of corpus uteri: N84.0

## 2018-10-30 HISTORY — DX: Presence of spectacles and contact lenses: Z97.3

## 2018-10-30 HISTORY — DX: Personal history of diseases of the blood and blood-forming organs and certain disorders involving the immune mechanism: Z86.2

## 2018-10-30 HISTORY — DX: Leiomyoma of uterus, unspecified: D25.9

## 2018-10-30 HISTORY — DX: Personal history of thrombophlebitis: Z86.72

## 2018-10-30 HISTORY — PX: DILITATION & CURRETTAGE/HYSTROSCOPY WITH HYDROTHERMAL ABLATION: SHX5570

## 2018-10-30 HISTORY — DX: Localized edema: R60.0

## 2018-10-30 HISTORY — DX: Exercise induced bronchospasm: J45.990

## 2018-10-30 LAB — POCT I-STAT, CHEM 8
BUN: 15 mg/dL (ref 6–20)
Calcium, Ion: 1.18 mmol/L (ref 1.15–1.40)
Chloride: 105 mmol/L (ref 98–111)
Creatinine, Ser: 0.7 mg/dL (ref 0.44–1.00)
Glucose, Bld: 98 mg/dL (ref 70–99)
HCT: 39 % (ref 36.0–46.0)
Hemoglobin: 13.3 g/dL (ref 12.0–15.0)
Potassium: 3.7 mmol/L (ref 3.5–5.1)
Sodium: 141 mmol/L (ref 135–145)
TCO2: 22 mmol/L (ref 22–32)

## 2018-10-30 LAB — POCT PREGNANCY, URINE: Preg Test, Ur: NEGATIVE

## 2018-10-30 LAB — TYPE AND SCREEN
ABO/RH(D): A POS
Antibody Screen: NEGATIVE

## 2018-10-30 SURGERY — DILATATION & CURETTAGE/HYSTEROSCOPY WITH HYDROTHERMAL ABLATION
Anesthesia: General | Site: Uterus

## 2018-10-30 MED ORDER — LACTATED RINGERS IV SOLN
INTRAVENOUS | Status: DC
  Administered 2018-10-30: 07:00:00 via INTRAVENOUS
  Filled 2018-10-30: qty 1000

## 2018-10-30 MED ORDER — KETOROLAC TROMETHAMINE 30 MG/ML IJ SOLN
INTRAMUSCULAR | Status: AC
  Filled 2018-10-30: qty 1

## 2018-10-30 MED ORDER — ONDANSETRON HCL 4 MG/2ML IJ SOLN
4.0000 mg | Freq: Once | INTRAMUSCULAR | Status: DC | PRN
  Filled 2018-10-30: qty 2

## 2018-10-30 MED ORDER — PROPOFOL 10 MG/ML IV BOLUS
INTRAVENOUS | Status: AC
  Filled 2018-10-30: qty 20

## 2018-10-30 MED ORDER — FENTANYL CITRATE (PF) 100 MCG/2ML IJ SOLN
INTRAMUSCULAR | Status: AC
  Filled 2018-10-30: qty 2

## 2018-10-30 MED ORDER — FENTANYL CITRATE (PF) 100 MCG/2ML IJ SOLN
INTRAMUSCULAR | Status: DC | PRN
  Administered 2018-10-30 (×3): 50 ug via INTRAVENOUS

## 2018-10-30 MED ORDER — SODIUM CHLORIDE 0.9 % IR SOLN
Status: DC | PRN
  Administered 2018-10-30: 3000 mL

## 2018-10-30 MED ORDER — PROPOFOL 10 MG/ML IV BOLUS
INTRAVENOUS | Status: DC | PRN
  Administered 2018-10-30: 170 mg via INTRAVENOUS

## 2018-10-30 MED ORDER — WHITE PETROLATUM EX OINT
TOPICAL_OINTMENT | CUTANEOUS | Status: AC
  Filled 2018-10-30: qty 5

## 2018-10-30 MED ORDER — SCOPOLAMINE 1 MG/3DAYS TD PT72
MEDICATED_PATCH | TRANSDERMAL | Status: DC | PRN
  Administered 2018-10-30: 1 via TRANSDERMAL

## 2018-10-30 MED ORDER — LIDOCAINE 2% (20 MG/ML) 5 ML SYRINGE
INTRAMUSCULAR | Status: DC | PRN
  Administered 2018-10-30: 100 mg via INTRAVENOUS

## 2018-10-30 MED ORDER — OXYCODONE HCL 5 MG PO TABS
ORAL_TABLET | ORAL | Status: AC
  Filled 2018-10-30: qty 1

## 2018-10-30 MED ORDER — KETOROLAC TROMETHAMINE 30 MG/ML IJ SOLN
INTRAMUSCULAR | Status: DC | PRN
  Administered 2018-10-30: 30 mg via INTRAVENOUS

## 2018-10-30 MED ORDER — DEXAMETHASONE SODIUM PHOSPHATE 10 MG/ML IJ SOLN
INTRAMUSCULAR | Status: AC
  Filled 2018-10-30: qty 1

## 2018-10-30 MED ORDER — FENTANYL CITRATE (PF) 100 MCG/2ML IJ SOLN
25.0000 ug | INTRAMUSCULAR | Status: DC | PRN
  Administered 2018-10-30 (×2): 25 ug via INTRAVENOUS
  Filled 2018-10-30: qty 1

## 2018-10-30 MED ORDER — SODIUM CHLORIDE 0.9 % IV SOLN
2.0000 g | INTRAVENOUS | Status: AC
  Administered 2018-10-30: 2 g via INTRAVENOUS
  Filled 2018-10-30: qty 2

## 2018-10-30 MED ORDER — SODIUM CHLORIDE 0.9 % IV SOLN
INTRAVENOUS | Status: AC
  Filled 2018-10-30: qty 2

## 2018-10-30 MED ORDER — OXYCODONE HCL 5 MG/5ML PO SOLN
5.0000 mg | Freq: Once | ORAL | Status: DC | PRN
  Filled 2018-10-30: qty 5

## 2018-10-30 MED ORDER — OXYCODONE HCL 5 MG PO TABS
5.0000 mg | ORAL_TABLET | Freq: Once | ORAL | Status: DC | PRN
  Filled 2018-10-30: qty 1

## 2018-10-30 MED ORDER — IBUPROFEN 600 MG PO TABS: 600.0000 mg | ORAL_TABLET | Freq: Four times a day (QID) | ORAL | 0 refills | Status: DC | PRN

## 2018-10-30 MED ORDER — LIDOCAINE HCL 1 % IJ SOLN
INTRAMUSCULAR | Status: DC | PRN
  Administered 2018-10-30: 10 mL

## 2018-10-30 MED ORDER — ONDANSETRON HCL 4 MG/2ML IJ SOLN
INTRAMUSCULAR | Status: AC
  Filled 2018-10-30: qty 2

## 2018-10-30 MED ORDER — OXYCODONE HCL 5 MG PO TABS
5.0000 mg | ORAL_TABLET | ORAL | Status: DC | PRN
  Administered 2018-10-30: 5 mg via ORAL
  Filled 2018-10-30: qty 1

## 2018-10-30 MED ORDER — LACTATED RINGERS IV SOLN
INTRAVENOUS | Status: DC
  Filled 2018-10-30: qty 1000

## 2018-10-30 MED ORDER — ONDANSETRON HCL 4 MG/2ML IJ SOLN
INTRAMUSCULAR | Status: DC | PRN
  Administered 2018-10-30: 4 mg via INTRAVENOUS

## 2018-10-30 MED ORDER — OXYCODONE HCL 5 MG PO TABS: 5.0000 mg | ORAL_TABLET | ORAL | 0 refills | Status: DC | PRN

## 2018-10-30 MED ORDER — MIDAZOLAM HCL 2 MG/2ML IJ SOLN
INTRAMUSCULAR | Status: DC | PRN
  Administered 2018-10-30: 2 mg via INTRAVENOUS

## 2018-10-30 MED ORDER — PROPOFOL 500 MG/50ML IV EMUL
INTRAVENOUS | Status: AC
  Filled 2018-10-30: qty 50

## 2018-10-30 MED ORDER — MIDAZOLAM HCL 2 MG/2ML IJ SOLN
INTRAMUSCULAR | Status: AC
  Filled 2018-10-30: qty 2

## 2018-10-30 MED ORDER — SCOPOLAMINE 1 MG/3DAYS TD PT72
MEDICATED_PATCH | TRANSDERMAL | Status: AC
  Filled 2018-10-30: qty 1

## 2018-10-30 MED ORDER — IBUPROFEN 600 MG PO TABS
600.0000 mg | ORAL_TABLET | Freq: Four times a day (QID) | ORAL | Status: DC | PRN
  Filled 2018-10-30: qty 1

## 2018-10-30 MED ORDER — DEXAMETHASONE SODIUM PHOSPHATE 10 MG/ML IJ SOLN
INTRAMUSCULAR | Status: DC | PRN
  Administered 2018-10-30: 10 mg via INTRAVENOUS

## 2018-10-30 MED ORDER — LIDOCAINE 2% (20 MG/ML) 5 ML SYRINGE
INTRAMUSCULAR | Status: AC
  Filled 2018-10-30: qty 5

## 2018-10-30 SURGICAL SUPPLY — 17 items
CATH ROBINSON RED A/P 16FR (CATHETERS) ×3 IMPLANT
COUNTER NEEDLE 1200 MAGNETIC (NEEDLE) ×3 IMPLANT
COVER WAND RF STERILE (DRAPES) ×3 IMPLANT
DILATOR CANAL MILEX (MISCELLANEOUS) IMPLANT
GAUZE 4X4 16PLY RFD (DISPOSABLE) ×3 IMPLANT
GLOVE BIO SURGEON STRL SZ 6.5 (GLOVE) ×4 IMPLANT
GLOVE BIO SURGEONS STRL SZ 6.5 (GLOVE) ×2
GOWN STRL REUS W/TWL LRG LVL3 (GOWN DISPOSABLE) ×3 IMPLANT
IV NS IRRIG 3000ML ARTHROMATIC (IV SOLUTION) ×6 IMPLANT
KIT TURNOVER CYSTO (KITS) ×3 IMPLANT
PACK VAGINAL MINOR WOMEN LF (CUSTOM PROCEDURE TRAY) ×3 IMPLANT
PAD OB MATERNITY 4.3X12.25 (PERSONAL CARE ITEMS) ×3 IMPLANT
SET GENESYS HTA PROCERVA (MISCELLANEOUS) ×2 IMPLANT
TOWEL OR 17X26 10 PK STRL BLUE (TOWEL DISPOSABLE) ×6 IMPLANT
TUBING AQUILEX INFLOW (TUBING) IMPLANT
TUBING AQUILEX OUTFLOW (TUBING) IMPLANT
WATER STERILE IRR 500ML POUR (IV SOLUTION) ×3 IMPLANT

## 2018-10-30 NOTE — Anesthesia Postprocedure Evaluation (Signed)
Anesthesia Post Note  Patient: Madeline Held, MD  Procedure(s) Performed: DILATATION & CURETTAGE/HYSTEROSCOPY WITH HYDROTHERMAL ABLATION (N/A Uterus)     Patient location during evaluation: PACU Anesthesia Type: General Level of consciousness: awake and alert Pain management: pain level controlled Vital Signs Assessment: post-procedure vital signs reviewed and stable Respiratory status: spontaneous breathing, nonlabored ventilation, respiratory function stable and patient connected to nasal cannula oxygen Cardiovascular status: blood pressure returned to baseline and stable Postop Assessment: no apparent nausea or vomiting Anesthetic complications: no    Last Vitals:  Vitals:   10/30/18 1000 10/30/18 1030  BP: 124/83 113/60  Pulse: 63 77  Resp: 16 15  Temp: 36.7 C   SpO2: 97% 97%    Last Pain:  Vitals:   10/30/18 1000  TempSrc: Oral  PainSc: 4                  Aiyanna Awtrey COKER

## 2018-10-30 NOTE — Discharge Instructions (Signed)

## 2018-10-30 NOTE — Transfer of Care (Signed)
Immediate Anesthesia Transfer of Care Note  Patient: Madeline Held, MD  Procedure(s) Performed: Procedure(s) (LRB): DILATATION & CURETTAGE/HYSTEROSCOPY WITH HYDROTHERMAL ABLATION (N/A)  Patient Location: PACU  Anesthesia Type: General  Level of Consciousness: awake, oriented, sedated and patient cooperative  Airway & Oxygen Therapy: Patient Spontanous Breathing and Patient connected to face mask oxygen  Post-op Assessment: Report given to PACU RN and Post -op Vital signs reviewed and stable  Post vital signs: Reviewed and stable  Complications: No apparent anesthesia complications Last Vitals:  Vitals Value Taken Time  BP 127/82 10/30/18 0823  Temp    Pulse 73 10/30/18 0825  Resp 15 10/30/18 0825  SpO2 97 % 10/30/18 0825  Vitals shown include unvalidated device data.  Last Pain:  Vitals:   10/30/18 0548  TempSrc: Oral  PainSc: 0-No pain      Patients Stated Pain Goal: 5 (10/30/18 0550)

## 2018-10-30 NOTE — Brief Op Note (Signed)
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  PRE-OPERATIVE DIAGNOSIS:  menorrhagia, polyp  POST-OPERATIVE DIAGNOSIS:  menorrhagia, polyp  PROCEDURE:  Procedure(s): DILATATION & CURETTAGE/HYSTEROSCOPY WITH HYDROTHERMAL ABLATION (N/A)  SURGEON:  Surgeon(s) and Role:    * Dian Queen, MD - Primary  PHYSICIAN ASSISTANT:   ASSISTANTS: none   ANESTHESIA:   paracervical block and MAC  EBL:  5 mL   BLOOD ADMINISTERED:none  DRAINS: none   LOCAL MEDICATIONS USED:  LIDOCAINE   SPECIMEN:  Source of Specimen:  uterine curettings with polyp  DISPOSITION OF SPECIMEN:  PATHOLOGY  COUNTS:  YES  TOURNIQUET:  * No tourniquets in log *  DICTATION: .Dragon Dictation  PLAN OF CARE: Discharge to home after PACU  PATIENT DISPOSITION:  PACU - hemodynamically stable.   Delay start of Pharmacological VTE agent (>24hrs) due to surgical blood loss or risk of bleeding: not applicable

## 2018-10-30 NOTE — Anesthesia Procedure Notes (Signed)
Procedure Name: LMA Insertion Date/Time: 10/30/2018 7:34 AM Performed by: Suan Halter, CRNA Pre-anesthesia Checklist: Patient identified, Emergency Drugs available, Suction available and Patient being monitored Patient Re-evaluated:Patient Re-evaluated prior to induction Oxygen Delivery Method: Circle system utilized Preoxygenation: Pre-oxygenation with 100% oxygen Induction Type: IV induction Ventilation: Mask ventilation without difficulty LMA: LMA inserted LMA Size: 4.0 Number of attempts: 1 Airway Equipment and Method: Bite block Placement Confirmation: positive ETCO2 Tube secured with: Tape Dental Injury: Teeth and Oropharynx as per pre-operative assessment

## 2018-10-30 NOTE — Anesthesia Preprocedure Evaluation (Addendum)

## 2018-10-30 NOTE — Op Note (Signed)
NAME: [name], [name] MEDICAL RECORD [id number] ACCOUNT [id number] DATE OF BIRTH:1965-04-10 FACILITY: [facility] LOCATION: [facility] PHYSICIAN:Esmerelda Finnigan Lynett Fish, MD  OPERATIVE REPORT  DATE OF PROCEDURE:  10/30/2018  PREOPERATIVE DIAGNOSES:  Abnormal uterine bleeding and possible endometrial polyp.  POSTOPERATIVE DIAGNOSES:  Abnormal uterine bleeding and possible endometrial polyp.  PROCEDURE:  Hysteroscopy, dilatation and curettage, removal of endometrial polyp, and hydrothermal endometrial ablation.  SURGEON:  Dian Queen, MD  ANESTHESIA:  MAC with paracervical block.  DESCRIPTION OF PROCEDURE:  The patient was taken to the operating room.  She was administered anesthesia, and she was prepped and draped in the usual sterile fashion.  A speculum was inserted into the vagina.  The cervix was grasped with a tenaculum, and  a paracervical block was performed.  The cervical internal os was gently dilated to a #25 Pratt dilator.  A sharp curette was inserted.  The uterus was curetted of all tissue, polyp forceps were inserted, and polypoid tissue was removed.  This tissue  was sent to pathology for analysis.  The HTA was inserted and confirmation of intrauterine correct placement was noted.  Fluid check was noted and was zero.  A10-minute HTA was performed with no incident.  The instrument was removed.  All sponge, lap,  and instrument counts were correct x2.  The patient went to the recovery room in stable condition.  All sponge, lap, and instrument counts were correct x2.  The patient went to recovery room in stable condition.  PATHOLOGY:  Uterine curettings.  ESTIMATED BLOOD LOSS:  Less than 10 mL.  COMPLICATIONS:  None.  LN/NUANCE  D:10/30/2018 T:10/30/2018 JOB:[id number]

## 2018-10-30 NOTE — H&P (Signed)
53 year old female here for D and C, Hysteroscopy and HTA She has had abnormal bleeding. Ultrasound - possible endometrial polyp  Past Medical History:  Diagnosis Date  . Edema, lower extremity    takes HCTZ as needed  . Endometrial polyp   . Exercise-induced asthma    per pt has used inhaler in yrs  . History of anemia   . History of thrombophlebitis    per pt approx. 2015 right lower leg superficial   . PONV (postoperative nausea and vomiting)   . Uterine fibroid   . Wears glasses    Past Surgical History:  Procedure Laterality Date  . ANTERIOR FUSION LUMBAR SPINE  05-18-2007   dr Vertell Limber / dr Amedeo Plenty  @[facility]    re-do diskectomy/ dissection L5 -S1 and fusion  . APPENDECTOMY  age 71  . HYSTEROSCOPY W/D&C N/A 11/07/2017   Procedure: DILATATION AND CURETTAGE /HYSTEROSCOPY;  Surgeon: Dian Queen, MD;  Location: [facility];  Service: Gynecology;  Laterality: N/A;  . LAPAROTOMY  age 82   for ovarian cyst  . LUMBAR DISC SURGERY  11-07-2006;  12-26-2006  dr Vertell Limber @[facility]    L5 -- S1  . MYOMECTOMY N/A 10/18/2013   Procedure: ABDOMINAL MYOMECTOMY;  Surgeon: Cyril Mourning, MD;  Location: [facility];  Service: Gynecology;  Laterality: N/A;  . REDUCTION MAMMAPLASTY Bilateral 1995  . 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 [place]  . TYMPANOSTOMY TUBE PLACEMENT Bilateral child   Prior to Admission medications   Medication Sig Start Date End Date Taking? Authorizing Provider  hydrochlorothiazide (HYDRODIURIL) 25 MG tablet Take 1 tablet (25 mg total) by mouth daily as needed. edema 07/07/18  Yes Tower, Wynelle Fanny, MD  PRESCRIPTION MEDICATION Take 1 tablet by mouth at bedtime. Birth control pill, pt unsure name   Yes [provider]   Allergies Levofloxacin and Tape Family History  Problem Relation Age of Onset  . Hypertension Mother   . Hyperlipidemia Father   . COPD Father   . Colon cancer Neg Hx   . Colon polyps Neg Hx   . Esophageal cancer  Neg Hx   . Rectal cancer Neg Hx   . Stomach cancer Neg Hx    Social History   Socioeconomic History  . Marital status: Married    Spouse name: Not on file  . Number of children: Not on file  . Years of education: Not on file  . Highest education level: Not on file  Occupational History  . Not on file  Social Needs  . Financial resource strain: Not on file  . Food insecurity    Worry: Not on file    Inability: Not on file  . Transportation needs    Medical: Not on file    Non-medical: Not on file  Tobacco Use  . Smoking status: Never Smoker  . Smokeless tobacco: Never Used  Substance and Sexual Activity  . Alcohol use: Yes    Comment: occasional  . Drug use: Never  . Sexual activity: Yes    Birth control/protection: Pill  Lifestyle  . Physical activity    Days per week: Not on file    Minutes per session: Not on file  . Stress: Not on file  Relationships  . Social Herbalist on phone: Not on file    Gets together: Not on file    Attends religious service: Not on file    Active member of  club or organization: Not on file    Attends meetings of clubs or organizations: Not on file    Relationship status: Not on file  Other Topics Concern  . Not on file  Social History Narrative   Drinks to 1-2 cans of soda daily.      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   BP 129/87   Pulse 78   Temp 98.2 F (36.8 C) (Oral)   Resp 16   Ht 5\' 5"  (1.651 m)   Wt 111.2 kg   LMP 10/08/2018 (Approximate)   SpO2 96%   BMI 40.80 kg/m  Results for orders placed or performed during the hospital encounter of 10/30/18 (from the past 24 hour(s))  Pregnancy, urine POC     Status: None   Collection Time: 10/30/18  5:47 AM  Result Value Ref Range   Preg Test, Ur NEGATIVE NEGATIVE  Type and screen [facility]     Status: None   Collection Time: 10/30/18  6:02 AM  Result Value Ref Range   ABO/RH(D) A POS    Antibody Screen NEG    Sample Expiration       11/02/2018,2359 Performed at [facility], [place] [address], [place], [place] [address]   I-STAT, [place] [address]     Status: None   Collection Time: 10/30/18  6:17 AM  Result Value Ref Range   Sodium 141 135 - 145 mmol/L   Potassium 3.7 3.5 - 5.1 mmol/L   Chloride 105 98 - 111 mmol/L   BUN 15 6 - 20 mg/dL   Creatinine, Ser 0.70 0.44 - 1.00 mg/dL   Glucose, Bld 98 70 - 99 mg/dL   Calcium, Ion 1.18 1.15 - 1.40 mmol/L   TCO2 22 22 - 32 mmol/L   Hemoglobin 13.3 12.0 - 15.0 g/dL   HCT 39.0 36.0 - 46.0 %  Gen Alert and oriented Lung CTAB Car RRR Abdomen is soft and non tender Pelvic WNL  IMPRESSION: Abnormal Bleeding Possible endometrial polyp  PLAN: D and C Hysteroscopy HTA Consent signed Risks reviewed

## 2018-11-02 ENCOUNTER — Encounter (HOSPITAL_BASED_OUTPATIENT_CLINIC_OR_DEPARTMENT_OTHER): Payer: Self-pay | Admitting: Obstetrics and Gynecology

## 2018-11-02 LAB — SURGICAL PATHOLOGY

## 2019-01-01 ENCOUNTER — Other Ambulatory Visit: Payer: Self-pay | Admitting: Family Medicine

## 2019-01-18 ENCOUNTER — Telehealth: Payer: Self-pay | Admitting: Family Medicine

## 2019-01-18 NOTE — Telephone Encounter (Signed)
Per. Dr. Charlett Blake patient needs a letter stating she can not attend jury duty this year due to last minute notice and she has to work during this time.

## 2019-01-27 ENCOUNTER — Encounter: Payer: Self-pay | Admitting: Family Medicine

## 2019-02-01 ENCOUNTER — Other Ambulatory Visit: Payer: Self-pay | Admitting: Family Medicine

## 2019-02-01 NOTE — Telephone Encounter (Signed)
Pt hasn't been seen here since 08/07/16 and no future appts., please advise

## 2019-04-07 MED FILL — HYDROCHLOROTHIAZIDE 25 MG T: 25 | 30 days supply | Qty: 30 | Fill #0

## 2019-04-13 ENCOUNTER — Encounter: Payer: Self-pay | Admitting: Family Medicine

## 2019-05-03 MED FILL — NORETHINDRONE 0.35 MG TABS: 0.35 | 56 days supply | Qty: 56 | Fill #0

## 2019-05-12 ENCOUNTER — Other Ambulatory Visit: Payer: Self-pay | Admitting: Family Medicine

## 2019-05-12 DIAGNOSIS — Z1231 Encounter for screening mammogram for malignant neoplasm of breast: Secondary | ICD-10-CM

## 2019-06-02 ENCOUNTER — Encounter: Payer: Self-pay | Admitting: Family Medicine

## 2019-06-02 ENCOUNTER — Other Ambulatory Visit: Payer: Self-pay | Admitting: Family Medicine

## 2019-06-02 ENCOUNTER — Other Ambulatory Visit: Payer: Self-pay

## 2019-06-02 ENCOUNTER — Ambulatory Visit (INDEPENDENT_AMBULATORY_CARE_PROVIDER_SITE_OTHER): Payer: 59 | Admitting: Family Medicine

## 2019-06-02 VITALS — BP 112/72 | HR 66 | Temp 97.1°F | Ht 64.5 in | Wt 235.0 lb

## 2019-06-02 DIAGNOSIS — N926 Irregular menstruation, unspecified: Secondary | ICD-10-CM

## 2019-06-02 DIAGNOSIS — E669 Obesity, unspecified: Secondary | ICD-10-CM | POA: Diagnosis not present

## 2019-06-02 DIAGNOSIS — Z0000 Encounter for general adult medical examination without abnormal findings: Secondary | ICD-10-CM | POA: Diagnosis not present

## 2019-06-02 DIAGNOSIS — E7800 Pure hypercholesterolemia, unspecified: Secondary | ICD-10-CM | POA: Diagnosis not present

## 2019-06-02 DIAGNOSIS — Z1211 Encounter for screening for malignant neoplasm of colon: Secondary | ICD-10-CM | POA: Diagnosis not present

## 2019-06-02 DIAGNOSIS — Z6841 Body Mass Index (BMI) 40.0 and over, adult: Secondary | ICD-10-CM | POA: Insufficient documentation

## 2019-06-02 MED ORDER — NORETHINDRONE 0.35 MG PO TABS: 1.0000 | ORAL_TABLET | Freq: Every day | ORAL | 3 refills | Status: DC

## 2019-06-02 NOTE — Progress Notes (Signed)
Subjective:    Patient ID: Madeline Held, MD, female    DOB: February 02, 1965, 54 y.o.   MRN: [id number]  This visit occurred during the SARS-CoV-2 public health emergency.  Safety protocols were in place, including screening questions prior to the visit, additional usage of staff PPE, and extensive cleaning of exam room while observing appropriate contact time as indicated for disinfecting solutions.    HPI  Here for health maintenance exam and to review chronic medical problems    Wt Readings from Last 3 Encounters:  06/02/19 235 lb (106.6 kg)  10/30/18 245 lb 3.2 oz (111.2 kg)  07/15/18 254 lb (115.2 kg)   39.71 kg/m   Lost almost 50 lb this year  Program -[employer] (high protein/low carb/ low sugar)   Work is stressful  Her parents moved here   HIV screening   Pap/gyn exam -Dr Helane Rima  Has appt in June  Had an ablation for fibroids-coming back /and a polyp  Taking micronor    Mammogram scheduled for 6/2 Self breast exam -no lumps   Flu shot 9/20 Tdap 7/18 Had pfizer covid vaccines  Had shingrix series   Has derm appt upcoming  Uses sunscreen   Colonoscopy 7/20   H/o hyperlipidemia Lab Results  Component Value Date   CHOL 260 (H) 10/13/2018   HDL 57 10/13/2018   LDLCALC 177 (H) 10/13/2018   LDLDIRECT 164.6 04/09/2012   TRIG 124 10/13/2018   CHOLHDL 4.6 10/13/2018   Had doctor day labs   now-total 231 HDL 66  LDL 152  Trig nl  No heart issues in the family    No anemia   BP Readings from Last 3 Encounters:  06/02/19 112/72  10/30/18 113/60  07/15/18 113/66   Pulse Readings from Last 3 Encounters:  06/02/19 66  10/30/18 77  07/15/18 (!) 57     Patient Active Problem List   Diagnosis Date Noted  . Routine general medical examination at a health care facility 06/02/2019  . Obesity (BMI 30-39.9) 06/02/2019  . Irregular menses 06/02/2019  . Colon cancer screening 06/11/2018  . Knee pain, bilateral 02/24/2018  . Low back pain 12/27/2015   . Lumbar degenerative disc disease 12/27/2015  . Plantar fasciitis, left 06/08/2014  . S/P myomectomy 10/18/2013  . Acute superficial venous thrombosis of lower extremity 06/03/2012  . Lupus anticoagulant positive 06/03/2012  . Hyperlipidemia 11/04/2006  . ADD 11/04/2006  . ACNE NEC 11/04/2006   Past Medical History:  Diagnosis Date  . Edema, lower extremity    takes HCTZ as needed  . Endometrial polyp   . Exercise-induced asthma    per pt has used inhaler in yrs  . History of anemia   . History of thrombophlebitis    per pt approx. 2015 right lower leg superficial   . PONV (postoperative nausea and vomiting)   . Uterine fibroid   . Wears glasses    Past Surgical History:  Procedure Laterality Date  . ANTERIOR FUSION LUMBAR SPINE  05-18-2007   dr Vertell Limber / dr Amedeo Plenty  @[facility]    re-do diskectomy/ dissection L5 -S1 and fusion  . APPENDECTOMY  age 36  . DILITATION & CURRETTAGE/HYSTROSCOPY WITH HYDROTHERMAL ABLATION N/A 10/30/2018   Procedure: DILATATION & CURETTAGE/HYSTEROSCOPY WITH HYDROTHERMAL ABLATION;  Surgeon: Dian Queen, MD;  Location: [place];  Service: Gynecology;  Laterality: N/A;  . HYSTEROSCOPY WITH D & C N/A 11/07/2017   Procedure: DILATATION AND CURETTAGE /HYSTEROSCOPY;  Surgeon: Dian Queen, MD;  Location: [place]  [place];  Service: Gynecology;  Laterality: N/A;  . LAPAROTOMY  age 12   for ovarian cyst  . LUMBAR DISC SURGERY  11-07-2006;  12-26-2006  dr Vertell Limber @[facility]    L5 -- S1  . MYOMECTOMY N/A 10/18/2013   Procedure: ABDOMINAL MYOMECTOMY;  Surgeon: Cyril Mourning, MD;  Location: [facility];  Service: Gynecology;  Laterality: N/A;  . REDUCTION MAMMAPLASTY Bilateral 1995  . 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 TONSILLECTOMY  1972  . TYMPANOSTOMY TUBE PLACEMENT Bilateral child   Social History   Tobacco Use  . Smoking status: Never Smoker  . Smokeless tobacco: Never Used  Substance Use Topics  . Alcohol use: Yes      Comment: occasional  . Drug use: Never   Family History  Problem Relation Age of Onset  . Hypertension Mother   . Hyperlipidemia Father   . COPD Father   . Colon cancer Neg Hx   . Colon polyps Neg Hx   . Esophageal cancer Neg Hx   . Rectal cancer Neg Hx   . Stomach cancer Neg Hx    Allergies  Allergen Reactions  . Levofloxacin Other (See Comments) and Nausea Only    REACTION: abdiminal pain  High Dose 750 mg, ok with 500 mg dose  . Tape Rash    blister   Current Outpatient Medications on File Prior to Visit  Medication Sig Dispense Refill  . hydrochlorothiazide (HYDRODIURIL) 25 MG tablet TAKE 1 TABLET (25 MG TOTAL) BY MOUTH DAILY AS NEEDED. EDEMA 30 tablet 3  . ibuprofen (ADVIL) 200 MG tablet Take 200 mg by mouth every 6 (six) hours as needed.    Marland Kitchen levocetirizine (XYZAL) 5 MG tablet Take 5 mg by mouth every evening.     No current facility-administered medications on file prior to visit.    Review of Systems  Constitutional: Negative for activity change, appetite change, fatigue, fever and unexpected weight change.  HENT: Negative for congestion, ear pain, rhinorrhea, sinus pressure and sore throat.   Eyes: Negative for pain, redness and visual disturbance.  Respiratory: Negative for cough, shortness of breath and wheezing.   Cardiovascular: Negative for chest pain and palpitations.  Gastrointestinal: Negative for abdominal pain, blood in stool, constipation and diarrhea.  Endocrine: Negative for polydipsia and polyuria.  Genitourinary: Negative for dysuria, frequency and urgency.  Musculoskeletal: Negative for arthralgias, back pain and myalgias.  Skin: Negative for pallor and rash.  Allergic/Immunologic: Negative for environmental allergies.  Neurological: Negative for dizziness, syncope and headaches.  Hematological: Negative for adenopathy. Does not bruise/bleed easily.  Psychiatric/Behavioral: Negative for decreased concentration and dysphoric mood. The patient is  not nervous/anxious.        Objective:   Physical Exam Constitutional:      General: She is not in acute distress.    Appearance: Normal appearance. She is well-developed. She is obese. She is not ill-appearing or diaphoretic.  HENT:     Head: Normocephalic and atraumatic.     Right Ear: Tympanic membrane, ear canal and external ear normal.     Left Ear: Tympanic membrane, ear canal and external ear normal.     Nose: Nose normal. No congestion.     Mouth/Throat:     Mouth: Mucous membranes are moist.     Pharynx: Oropharynx is clear. No posterior oropharyngeal erythema.  Eyes:     General: No scleral icterus.    Extraocular Movements: Extraocular movements intact.     Conjunctiva/sclera:  Conjunctivae normal.     Pupils: Pupils are equal, round, and reactive to light.  Neck:     Thyroid: No thyromegaly.     Vascular: No carotid bruit or JVD.  Cardiovascular:     Rate and Rhythm: Normal rate and regular rhythm.     Pulses: Normal pulses.     Heart sounds: Normal heart sounds. No gallop.   Pulmonary:     Effort: Pulmonary effort is normal. No respiratory distress.     Breath sounds: Normal breath sounds. No wheezing.     Comments: Good air exch Chest:     Chest wall: No tenderness.  Abdominal:     General: Bowel sounds are normal. There is no distension or abdominal bruit.     Palpations: Abdomen is soft. There is no mass.     Tenderness: There is no abdominal tenderness.     Hernia: No hernia is present.  Genitourinary:    Comments: Breast and pelvic exam done by gyn     Musculoskeletal:        General: No tenderness. Normal range of motion.     Cervical back: Normal range of motion and neck supple. No rigidity. No muscular tenderness.     Right lower leg: No edema.     Left lower leg: No edema.  Lymphadenopathy:     Cervical: No cervical adenopathy.  Skin:    General: Skin is warm and dry.     Coloration: Skin is not pale.     Findings: No erythema or rash.      Comments: Slightly tanned Some lentigines   ? sk in R ear canal  Neurological:     Mental Status: She is alert. Mental status is at baseline.     Cranial Nerves: No cranial nerve deficit.     Motor: No abnormal muscle tone.     Coordination: Coordination normal.     Gait: Gait normal.     Deep Tendon Reflexes: Reflexes are normal and symmetric. Reflexes normal.  Psychiatric:        Mood and Affect: Mood and affect normal.        Cognition and Memory: Cognition and memory normal.     Comments: Pleasant             Assessment & Plan:   Problem List Items Addressed This Visit      Other   Hyperlipidemia    Reviewed labs done at her clinic  Disc goals for lipids and reasons to control them Rev last labs with pt Rev low sat fat diet in detail  LDL is down to 152 (prev 177) Good HDL at 66 Disc risks and family hx  Diet is already very good with current weight loss program  May be open to a statin later if this does not improve more        Colon cancer screening    UTD with colonoscopy from 7/20       Routine general medical examination at a health care facility - Primary    Reviewed health habits including diet and exercise and skin cancer prevention Reviewed appropriate screening tests for age  Also reviewed health mt list, fam hx and immunization status , as well as social and family history   Commended re: wt loss  Enc to add exercise  Declines HIV screen due to low risk  S/p ablation - and has f/u with gyn this summer  inst to get mammogram in June as planned  and continue self breast exam  Dermatology visit for skin screen upcoming  Discussed cholesterol numbers       Obesity (BMI 30-39.9)    Commended on wt loss so far with the [employer] program  Encouraged to add exercise when able and keep up a good water intake        Irregular menses    Obese menopausal or perimenopausal female  Sees gyn -Dr Helane Rima and will f/u this summer  Has had an ablation    Continues norethindrone daily (refilled) to avoid endometrial hyperplasia

## 2019-06-02 NOTE — Assessment & Plan Note (Addendum)
Reviewed labs done at her clinic  Disc goals for lipids and reasons to control them Rev last labs with pt Rev low sat fat diet in detail  LDL is down to 152 (prev 177) Good HDL at 66 Disc risks and family hx  Diet is already very good with current weight loss program  May be open to a statin later if this does not improve more

## 2019-06-02 NOTE — Patient Instructions (Addendum)
Let's keep an eye on your cholesterol   Keep up the good work with diet   Brainstorm exercise plan that works for you !

## 2019-06-02 NOTE — Assessment & Plan Note (Signed)
Reviewed health habits including diet and exercise and skin cancer prevention Reviewed appropriate screening tests for age  Also reviewed health mt list, fam hx and immunization status , as well as social and family history   Commended re: wt loss  Enc to add exercise  Declines HIV screen due to low risk  S/p ablation - and has f/u with gyn this summer  inst to get mammogram in June as planned and continue self breast exam  Dermatology visit for skin screen upcoming  Discussed cholesterol numbers

## 2019-06-02 NOTE — Assessment & Plan Note (Signed)
Commended on wt loss so far with the [employer] program  Encouraged to add exercise when able and keep up a good water intake

## 2019-06-02 NOTE — Assessment & Plan Note (Signed)
UTD with colonoscopy from 7/20

## 2019-06-02 NOTE — Assessment & Plan Note (Signed)
Obese menopausal or perimenopausal female  Sees gyn -Dr Helane Rima and will f/u this summer  Has had an ablation  Continues norethindrone daily (refilled) to avoid endometrial hyperplasia

## 2019-06-16 ENCOUNTER — Other Ambulatory Visit: Payer: Self-pay

## 2019-06-16 ENCOUNTER — Ambulatory Visit
Admission: RE | Admit: 2019-06-16 | Discharge: 2019-06-16 | Disposition: A | Discharge status: Home / Self Care | Payer: BC Managed Care – PPO | Source: Ambulatory Visit

## 2019-06-16 DIAGNOSIS — Z1231 Encounter for screening mammogram for malignant neoplasm of breast: Secondary | ICD-10-CM

## 2019-06-28 MED FILL — NORETHINDRONE 0.35 MG TABS: 0.35 | 84 days supply | Qty: 84 | Fill #0

## 2019-07-08 ENCOUNTER — Encounter: Payer: Self-pay | Admitting: Family Medicine

## 2019-07-09 ENCOUNTER — Other Ambulatory Visit: Payer: Self-pay | Admitting: Family Medicine

## 2019-07-09 ENCOUNTER — Telehealth: Payer: Self-pay | Admitting: *Deleted

## 2019-07-09 MED ORDER — METHOCARBAMOL 500 MG PO TABS: 500.0000 mg | ORAL_TABLET | Freq: Three times a day (TID) | ORAL | 3 refills | Status: DC | PRN

## 2019-07-09 NOTE — Telephone Encounter (Signed)
Pt left a message saying:  I forgot to ask you when I saw you. Would you be able to refill the muscle relaxer. I havent had it in a while. Sometimes with travel or stress my back acts up and its helpful.  Ive had robaxin and flexeril in the past, either is fine thanks.    [facility]

## 2019-07-28 DIAGNOSIS — H524 Presbyopia: Secondary | ICD-10-CM | POA: Diagnosis not present

## 2019-07-28 DIAGNOSIS — Z135 Encounter for screening for eye and ear disorders: Secondary | ICD-10-CM | POA: Diagnosis not present

## 2019-07-28 DIAGNOSIS — H5203 Hypermetropia, bilateral: Secondary | ICD-10-CM | POA: Diagnosis not present

## 2019-07-28 DIAGNOSIS — H52223 Regular astigmatism, bilateral: Secondary | ICD-10-CM | POA: Diagnosis not present

## 2019-08-03 MED FILL — METHOCARBAMOL 500 MG TABS: 500 | 20 days supply | Qty: 60 | Fill #0

## 2019-08-04 DIAGNOSIS — N760 Acute vaginitis: Secondary | ICD-10-CM | POA: Diagnosis not present

## 2019-08-04 DIAGNOSIS — Z01419 Encounter for gynecological examination (general) (routine) without abnormal findings: Secondary | ICD-10-CM | POA: Diagnosis not present

## 2019-08-04 DIAGNOSIS — Z6838 Body mass index (BMI) 38.0-38.9, adult: Secondary | ICD-10-CM | POA: Diagnosis not present

## 2019-08-16 ENCOUNTER — Other Ambulatory Visit: Payer: Self-pay | Admitting: Family Medicine

## 2019-08-18 ENCOUNTER — Other Ambulatory Visit: Payer: Self-pay | Admitting: Family Medicine

## 2019-08-18 MED FILL — HYDROCHLOROTHIAZIDE 25 MG T: 25 | 30 days supply | Qty: 30 | Fill #0

## 2019-09-01 ENCOUNTER — Other Ambulatory Visit: Payer: Self-pay

## 2019-09-01 ENCOUNTER — Ambulatory Visit: Payer: 59 | Admitting: Dermatology

## 2019-09-01 DIAGNOSIS — L821 Other seborrheic keratosis: Secondary | ICD-10-CM

## 2019-09-01 DIAGNOSIS — L814 Other melanin hyperpigmentation: Secondary | ICD-10-CM

## 2019-09-01 DIAGNOSIS — L570 Actinic keratosis: Secondary | ICD-10-CM

## 2019-09-01 DIAGNOSIS — D1800 Hemangioma unspecified site: Secondary | ICD-10-CM

## 2019-09-01 DIAGNOSIS — Z1283 Encounter for screening for malignant neoplasm of skin: Secondary | ICD-10-CM

## 2019-09-01 DIAGNOSIS — L578 Other skin changes due to chronic exposure to nonionizing radiation: Secondary | ICD-10-CM

## 2019-09-01 DIAGNOSIS — D485 Neoplasm of uncertain behavior of skin: Secondary | ICD-10-CM

## 2019-09-01 DIAGNOSIS — D229 Melanocytic nevi, unspecified: Secondary | ICD-10-CM | POA: Diagnosis not present

## 2019-09-01 DIAGNOSIS — L710 Perioral dermatitis: Secondary | ICD-10-CM | POA: Diagnosis not present

## 2019-09-01 HISTORY — DX: Actinic keratosis: L57.0

## 2019-09-01 MED ORDER — SULFACETAMIDE SODIUM-SULFUR 8-4 % EX SUSP: CUTANEOUS | 5 refills | Status: DC

## 2019-09-01 NOTE — Patient Instructions (Addendum)
For perioral dermatitis: Start SulfaCleanse 8/4 daily Start Amzeeq once daily at bedtime. Apply tic tac size amount.   Your medications have been sent to [facility] and will be mailed to you after you call them and confirm your information with them.   [facility] [phone] [address], [place], [place] [address]   Wound Care Instructions  1. Cleanse wound gently with soap and water once a day then pat dry with clean gauze. Apply a thing coat of Petrolatum (petroleum jelly, "Vaseline") over the wound (unless you have an allergy to this). We recommend that you use a new, sterile tube of Vaseline. Do not pick or remove scabs. Do not remove the yellow or white "healing tissue" from the base of the wound.  2. Cover the wound with fresh, clean, nonstick gauze and secure with paper tape. You may use Band-Aids in place of gauze and tape if the would is small enough, but would recommend trimming much of the tape off as there is often too much. Sometimes Band-Aids can irritate the skin.  3. You should call the office for your biopsy report after 1 week if you have not already been contacted.  4. If you experience any problems, such as abnormal amounts of bleeding, swelling, significant bruising, significant pain, or evidence of infection, please call the office immediately.  5. FOR ADULT SURGERY PATIENTS: If you need something for pain relief you may take 1 extra strength Tylenol (acetaminophen) AND 2 Ibuprofen (200mg  each) together every 4 hours as needed for pain. (do not take these if you are allergic to them or if you have a reason you should not take them.) Typically, you may only need pain medication for 1 to 3 days.   Recommend taking Heliocare sun protection supplement daily in sunny weather for additional sun protection. For maximum protection on the sunniest days, you can take up to 2 capsules of regular Heliocare OR take 1 capsule of Heliocare Ultra. For prolonged exposure  (such as a full day in the sun), you can repeat your dose of the supplement 4 hours after your first dose. Heliocare can be purchased at [facility] or at [email or web address].   Recommend Neocutis Neo-cleanse Exfoliating Skin Cleanser

## 2019-09-01 NOTE — Progress Notes (Signed)
   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 Patient does have a spot in her right ear, present for a long time. It is scaly and acts like a keratosis.   She also has some bumps around her mouth.  The following portions of the chart were reviewed this encounter and updated as appropriate:  Tobacco  Allergies  Meds  Problems  Med Hx  Surg Hx  Fam Hx      Review of Systems:  No other skin or systemic complaints except as noted in HPI or Assessment and Plan.  Objective  Well appearing patient in no apparent distress; mood and affect are within normal limits.  A full examination was performed including scalp, head, eyes, ears, nose, lips, neck, chest, axillae, abdomen, back, buttocks, bilateral upper extremities, bilateral lower extremities, hands, feet, fingers, toes, fingernails, and toenails. All findings within normal limits unless otherwise noted below.  Objective  Perioral: Scattered inflammatory papules chin, lip and medial cheeks  Objective  Left Lateral Breast: 0.5cm pink papule  Objective  Right Ear: 0.2-0.3cm crusted papule   Assessment & Plan  Perioral dermatitis Perioral  Start SulfaCleanse 8/4 daily Start Amzeeq once daily at bedtime. Apply tic tac size amount.   Ordered Medications: Sulfacetamide Sodium-Sulfur (SULFACLEANSE 8/4) 8-4 % SUSP  Neoplasm of uncertain behavior of skin (2) Left Lateral Breast  Skin / nail biopsy Type of biopsy: tangential   Informed consent: discussed and consent obtained   Timeout: patient name, date of birth, surgical site, and procedure verified   Patient was prepped and draped in usual sterile fashion: Area prepped with isopropyl alcohol. Anesthesia: the lesion was anesthetized in a standard fashion   Anesthetic:  1% lidocaine w/ epinephrine 1-100,000  buffered w/ 8.4% NaHCO3 Instrument used: flexible razor blade   Hemostasis achieved with: aluminum chloride   Outcome: patient tolerated procedure well   Post-procedure details: wound care instructions given   Additional details:  Mupirocin and a bandage applied  Specimen 1 - Surgical pathology Differential Diagnosis: r/o BCC Check Margins: No 0.5cm pink papule  Right Ear  Right ear favor excoriation - call if not resolved. Recheck on follow-up    Lentigines - Scattered tan macules - Discussed due to sun exposure - Benign, observe - Call for any changes  Seborrheic Keratoses - Stuck-on, waxy, tan-brown papules and plaques  - Discussed benign etiology and prognosis. - Observe - Call for any changes  Melanocytic Nevi - Tan-brown and/or pink-flesh-colored symmetric macules and papules - Benign appearing on exam today - Observation - Call clinic for new or changing moles - Recommend daily use of broad spectrum spf 30+ sunscreen to sun-exposed areas.   Hemangiomas - Red papules - Discussed benign nature - Observe - Call for any changes  Actinic Damage - diffuse scaly erythematous macules with underlying dyspigmentation - Recommend daily broad spectrum sunscreen SPF 30+ to sun-exposed areas, reapply every 2 hours as needed.  - Call for new or changing lesions.  Skin cancer screening performed today.   Return in about 2 months (around 11/01/2019) for perioral dermatitis and recheck right ear.  Graciella Belton, RMA, am acting as scribe for Forest Gleason, MD .   Documentation: I have reviewed the above documentation for accuracy and completeness, and I agree with the above.  Forest Gleason, MD

## 2019-09-09 ENCOUNTER — Telehealth: Payer: Self-pay

## 2019-09-09 NOTE — Telephone Encounter (Signed)
-----   Message from [place], MD sent at 09/09/2019  9:24 AM EDT ----- Skin , left lateral breast LICHENOID ACTINIC KERATOSIS "inflamed precancer spot" --> LN2 in clinic (at follow-up in October is fine)  MAs please call

## 2019-09-09 NOTE — Telephone Encounter (Signed)
Left msg for pt to call for results, JS 

## 2019-09-09 NOTE — Telephone Encounter (Signed)
Patient left voicemail she saw results on MyChart. I also sent her a MyChart message regarding the information of freezing spot at October appointment.

## 2019-09-09 NOTE — Progress Notes (Signed)
Skin , left lateral breast LICHENOID ACTINIC KERATOSIS "inflamed precancer spot" --> LN2 in clinic (at follow-up in October is fine)  MAs please call

## 2019-09-10 DIAGNOSIS — M25561 Pain in right knee: Secondary | ICD-10-CM | POA: Diagnosis not present

## 2019-09-10 DIAGNOSIS — M170 Bilateral primary osteoarthritis of knee: Secondary | ICD-10-CM | POA: Diagnosis not present

## 2019-09-10 DIAGNOSIS — M1711 Unilateral primary osteoarthritis, right knee: Secondary | ICD-10-CM | POA: Diagnosis not present

## 2019-09-13 ENCOUNTER — Encounter: Payer: Self-pay | Admitting: Dermatology

## 2019-09-27 DIAGNOSIS — M25561 Pain in right knee: Secondary | ICD-10-CM | POA: Diagnosis not present

## 2019-09-27 DIAGNOSIS — M25511 Pain in right shoulder: Secondary | ICD-10-CM | POA: Diagnosis not present

## 2019-09-27 DIAGNOSIS — M25661 Stiffness of right knee, not elsewhere classified: Secondary | ICD-10-CM | POA: Diagnosis not present

## 2019-09-27 DIAGNOSIS — M25611 Stiffness of right shoulder, not elsewhere classified: Secondary | ICD-10-CM | POA: Diagnosis not present

## 2019-09-27 DIAGNOSIS — R531 Weakness: Secondary | ICD-10-CM | POA: Diagnosis not present

## 2019-09-27 MED FILL — HYDROCHLOROTHIAZIDE 25 MG T: 25 | 30 days supply | Qty: 30 | Fill #1

## 2019-09-27 MED FILL — NORETHINDRONE 0.35 MG TABS: 0.35 | 84 days supply | Qty: 84 | Fill #1

## 2019-09-27 MED FILL — METHOCARBAMOL 500 MG TABS: 500 | 20 days supply | Qty: 60 | Fill #1

## 2019-09-30 DIAGNOSIS — R531 Weakness: Secondary | ICD-10-CM | POA: Diagnosis not present

## 2019-09-30 DIAGNOSIS — M25511 Pain in right shoulder: Secondary | ICD-10-CM | POA: Diagnosis not present

## 2019-09-30 DIAGNOSIS — M25611 Stiffness of right shoulder, not elsewhere classified: Secondary | ICD-10-CM | POA: Diagnosis not present

## 2019-09-30 DIAGNOSIS — M25661 Stiffness of right knee, not elsewhere classified: Secondary | ICD-10-CM | POA: Diagnosis not present

## 2019-09-30 DIAGNOSIS — M25561 Pain in right knee: Secondary | ICD-10-CM | POA: Diagnosis not present

## 2019-10-04 DIAGNOSIS — R531 Weakness: Secondary | ICD-10-CM | POA: Diagnosis not present

## 2019-10-04 DIAGNOSIS — M25661 Stiffness of right knee, not elsewhere classified: Secondary | ICD-10-CM | POA: Diagnosis not present

## 2019-10-04 DIAGNOSIS — M25561 Pain in right knee: Secondary | ICD-10-CM | POA: Diagnosis not present

## 2019-10-04 DIAGNOSIS — M25611 Stiffness of right shoulder, not elsewhere classified: Secondary | ICD-10-CM | POA: Diagnosis not present

## 2019-10-04 DIAGNOSIS — M25511 Pain in right shoulder: Secondary | ICD-10-CM | POA: Diagnosis not present

## 2019-10-11 ENCOUNTER — Other Ambulatory Visit: Payer: Self-pay | Admitting: Family Medicine

## 2019-10-11 DIAGNOSIS — M25561 Pain in right knee: Secondary | ICD-10-CM | POA: Diagnosis not present

## 2019-10-11 DIAGNOSIS — M25511 Pain in right shoulder: Secondary | ICD-10-CM | POA: Diagnosis not present

## 2019-10-11 DIAGNOSIS — M25611 Stiffness of right shoulder, not elsewhere classified: Secondary | ICD-10-CM | POA: Diagnosis not present

## 2019-10-11 DIAGNOSIS — R531 Weakness: Secondary | ICD-10-CM | POA: Diagnosis not present

## 2019-10-11 DIAGNOSIS — M25661 Stiffness of right knee, not elsewhere classified: Secondary | ICD-10-CM | POA: Diagnosis not present

## 2019-10-11 MED ORDER — CEPHALEXIN 500 MG PO CAPS
500.0000 mg | ORAL_CAPSULE | Freq: Four times a day (QID) | ORAL | 0 refills | Status: DC
Start: 2019-10-11 — End: 2020-05-22

## 2019-10-11 MED FILL — CEPHALEXIN 500 MG CAPSULE: 500 | 7 days supply | Qty: 28 | Fill #0

## 2019-10-14 DIAGNOSIS — M25561 Pain in right knee: Secondary | ICD-10-CM | POA: Diagnosis not present

## 2019-10-14 DIAGNOSIS — M25511 Pain in right shoulder: Secondary | ICD-10-CM | POA: Diagnosis not present

## 2019-10-14 DIAGNOSIS — M25611 Stiffness of right shoulder, not elsewhere classified: Secondary | ICD-10-CM | POA: Diagnosis not present

## 2019-10-14 DIAGNOSIS — M25661 Stiffness of right knee, not elsewhere classified: Secondary | ICD-10-CM | POA: Diagnosis not present

## 2019-10-14 DIAGNOSIS — R531 Weakness: Secondary | ICD-10-CM | POA: Diagnosis not present

## 2019-10-18 DIAGNOSIS — M25511 Pain in right shoulder: Secondary | ICD-10-CM | POA: Diagnosis not present

## 2019-10-18 DIAGNOSIS — R531 Weakness: Secondary | ICD-10-CM | POA: Diagnosis not present

## 2019-10-18 DIAGNOSIS — M25561 Pain in right knee: Secondary | ICD-10-CM | POA: Diagnosis not present

## 2019-10-18 DIAGNOSIS — M25661 Stiffness of right knee, not elsewhere classified: Secondary | ICD-10-CM | POA: Diagnosis not present

## 2019-10-18 DIAGNOSIS — M25611 Stiffness of right shoulder, not elsewhere classified: Secondary | ICD-10-CM | POA: Diagnosis not present

## 2019-10-21 DIAGNOSIS — M25511 Pain in right shoulder: Secondary | ICD-10-CM | POA: Diagnosis not present

## 2019-10-21 DIAGNOSIS — R531 Weakness: Secondary | ICD-10-CM | POA: Diagnosis not present

## 2019-10-21 DIAGNOSIS — M25561 Pain in right knee: Secondary | ICD-10-CM | POA: Diagnosis not present

## 2019-10-21 DIAGNOSIS — M25611 Stiffness of right shoulder, not elsewhere classified: Secondary | ICD-10-CM | POA: Diagnosis not present

## 2019-10-21 DIAGNOSIS — M25661 Stiffness of right knee, not elsewhere classified: Secondary | ICD-10-CM | POA: Diagnosis not present

## 2019-10-21 MED FILL — HYDROCHLOROTHIAZIDE 25 MG T: 25 | 30 days supply | Qty: 30 | Fill #2

## 2019-10-22 ENCOUNTER — Telehealth: Payer: Self-pay | Admitting: Internal Medicine

## 2019-10-22 ENCOUNTER — Other Ambulatory Visit: Payer: Self-pay | Admitting: Internal Medicine

## 2019-10-22 MED ORDER — AZITHROMYCIN 250 MG PO TABS: ORAL_TABLET | ORAL | 0 refills | Status: DC

## 2019-10-22 MED ORDER — PREDNISONE 10 MG PO TABS
ORAL_TABLET | ORAL | 0 refills | Status: DC
Start: 2019-10-22 — End: 2019-10-22

## 2019-10-22 MED ORDER — HYDROCODONE-HOMATROPINE 5-1.5 MG/5ML PO SYRP: 5.0000 mL | ORAL_SOLUTION | Freq: Every evening | ORAL | 0 refills | Status: DC | PRN

## 2019-10-22 MED FILL — HYDROMET SYRUP: 5-1.5 | 24 days supply | Qty: 120 | Fill #0

## 2019-10-22 MED FILL — AZITHROMYCIN 250 MG TABLET: 250 | 5 days supply | Qty: 6 | Fill #0

## 2019-10-22 MED FILL — predniSONE 10 MG TABS: 10 | 8 days supply | Qty: 20 | Fill #0

## 2019-10-22 NOTE — Telephone Encounter (Signed)
Patient is having upper respiratory symptoms, she tested negative for Covid. Is leaving town and would like to have a backup plan in case she gets worse. Plan: Z-Pak, prednisone, small amount of hydrocodone for cough control at night. To be use if needed

## 2019-10-28 DIAGNOSIS — M25661 Stiffness of right knee, not elsewhere classified: Secondary | ICD-10-CM | POA: Diagnosis not present

## 2019-10-28 DIAGNOSIS — M25561 Pain in right knee: Secondary | ICD-10-CM | POA: Diagnosis not present

## 2019-10-28 DIAGNOSIS — M25611 Stiffness of right shoulder, not elsewhere classified: Secondary | ICD-10-CM | POA: Diagnosis not present

## 2019-10-28 DIAGNOSIS — M25511 Pain in right shoulder: Secondary | ICD-10-CM | POA: Diagnosis not present

## 2019-10-28 DIAGNOSIS — R531 Weakness: Secondary | ICD-10-CM | POA: Diagnosis not present

## 2019-11-01 DIAGNOSIS — R531 Weakness: Secondary | ICD-10-CM | POA: Diagnosis not present

## 2019-11-01 DIAGNOSIS — M25511 Pain in right shoulder: Secondary | ICD-10-CM | POA: Diagnosis not present

## 2019-11-01 DIAGNOSIS — M25611 Stiffness of right shoulder, not elsewhere classified: Secondary | ICD-10-CM | POA: Diagnosis not present

## 2019-11-01 DIAGNOSIS — M25561 Pain in right knee: Secondary | ICD-10-CM | POA: Diagnosis not present

## 2019-11-01 DIAGNOSIS — M25661 Stiffness of right knee, not elsewhere classified: Secondary | ICD-10-CM | POA: Diagnosis not present

## 2019-11-03 ENCOUNTER — Ambulatory Visit: Payer: 59 | Admitting: Dermatology

## 2019-11-03 ENCOUNTER — Other Ambulatory Visit: Payer: Self-pay | Admitting: Dermatology

## 2019-11-03 ENCOUNTER — Encounter: Payer: Self-pay | Admitting: Dermatology

## 2019-11-03 ENCOUNTER — Other Ambulatory Visit: Payer: Self-pay

## 2019-11-03 DIAGNOSIS — L710 Perioral dermatitis: Secondary | ICD-10-CM | POA: Diagnosis not present

## 2019-11-03 DIAGNOSIS — L570 Actinic keratosis: Secondary | ICD-10-CM | POA: Diagnosis not present

## 2019-11-03 MED ORDER — DOXYCYCLINE HYCLATE 20 MG PO TABS: 20.0000 mg | ORAL_TABLET | Freq: Two times a day (BID) | ORAL | 2 refills | Status: DC

## 2019-11-03 MED FILL — DOXYCYCLINE HYCLATE 20 MG T: 20 | 30 days supply | Qty: 60 | Fill #0

## 2019-11-03 NOTE — Patient Instructions (Addendum)
Cryotherapy Aftercare   Wash gently with soap and water everyday.    Apply Vaseline and Band-Aid daily until healed.  Prior to procedure, discussed risks of blister formation, small wound, skin dyspigmentation, or rare scar following cryotherapy.   Start doxycycline 20mg  twice daily with food until clear for 1 month then decrease to once daily for 2 weeks before stopping.   Doxycycline should be taken with food to prevent nausea. Do not lay down for 30 minutes after taking. Be cautious with sun exposure and use good sun protection while on this medication. Pregnant women should not take this medication.

## 2019-11-03 NOTE — Progress Notes (Signed)
   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 Sulfacleanse made it worse and caused more pustules.  Also here to treat lichenoid actinic keratosis (Bx proven, left lateral breast. Here for LN2 Tx. ).  The following portions of the chart were reviewed this encounter and updated as appropriate: Tobacco  Allergies  Meds  Problems  Med Hx  Surg Hx  Fam Hx      Review of Systems: No other skin or systemic complaints except as noted in HPI or Assessment and Plan.  Objective  Well appearing patient in no apparent distress; mood and affect are within normal limits.  A focused examination was performed including face, arms, chest. Relevant physical exam findings are noted in the Assessment and Plan.  Objective  perioral: Scattered inflammatory papules  Objective  Left lateral Breast: Scar with adjacent focus of thin gritty scale   Assessment & Plan  Perioral dermatitis perioral  Start doxycycline 20mg  twice daily with food until clear for 1 month then decrease to once daily for 2 weeks before stopping.   Doxycycline should be taken with food to prevent nausea. Do not lay down for 30 minutes after taking. Be cautious with sun exposure and use good sun protection while on this medication. Pregnant women should not take this medication.    Ordered Medications: doxycycline (PERIOSTAT) 20 MG tablet  Lichenoid actinic keratosis Left lateral Breast  Prior to procedure, discussed risks of blister formation, small wound, skin dyspigmentation, or rare scar following cryotherapy.    Destruction of lesion - Left lateral Breast Complexity: simple   Destruction method: cryotherapy   Informed consent: discussed and consent obtained   Lesion destroyed using liquid nitrogen: Yes   Cryotherapy cycles:  2 Outcome: patient tolerated procedure well with no complications   Post-procedure  details: wound care instructions given    Return in about 2 months (around 01/03/2020) for perioral dermatitis follow up.   I, Emelia Salisbury, CMA, am acting as scribe for Forest Gleason, MD.  Documentation: I have reviewed the above documentation for accuracy and completeness, and I agree with the above.  Forest Gleason, MD

## 2019-11-04 DIAGNOSIS — R531 Weakness: Secondary | ICD-10-CM | POA: Diagnosis not present

## 2019-11-04 DIAGNOSIS — M25511 Pain in right shoulder: Secondary | ICD-10-CM | POA: Diagnosis not present

## 2019-11-04 DIAGNOSIS — M25661 Stiffness of right knee, not elsewhere classified: Secondary | ICD-10-CM | POA: Diagnosis not present

## 2019-11-04 DIAGNOSIS — M25611 Stiffness of right shoulder, not elsewhere classified: Secondary | ICD-10-CM | POA: Diagnosis not present

## 2019-11-04 DIAGNOSIS — M25561 Pain in right knee: Secondary | ICD-10-CM | POA: Diagnosis not present

## 2019-11-08 DIAGNOSIS — M25561 Pain in right knee: Secondary | ICD-10-CM | POA: Diagnosis not present

## 2019-11-09 DIAGNOSIS — M25661 Stiffness of right knee, not elsewhere classified: Secondary | ICD-10-CM | POA: Diagnosis not present

## 2019-11-09 DIAGNOSIS — M25511 Pain in right shoulder: Secondary | ICD-10-CM | POA: Diagnosis not present

## 2019-11-09 DIAGNOSIS — M25561 Pain in right knee: Secondary | ICD-10-CM | POA: Diagnosis not present

## 2019-11-09 DIAGNOSIS — M25611 Stiffness of right shoulder, not elsewhere classified: Secondary | ICD-10-CM | POA: Diagnosis not present

## 2019-11-09 DIAGNOSIS — R531 Weakness: Secondary | ICD-10-CM | POA: Diagnosis not present

## 2019-11-10 ENCOUNTER — Encounter: Payer: Self-pay | Admitting: Dermatology

## 2019-11-11 DIAGNOSIS — M25561 Pain in right knee: Secondary | ICD-10-CM | POA: Diagnosis not present

## 2019-11-11 DIAGNOSIS — M25511 Pain in right shoulder: Secondary | ICD-10-CM | POA: Diagnosis not present

## 2019-11-11 DIAGNOSIS — M25661 Stiffness of right knee, not elsewhere classified: Secondary | ICD-10-CM | POA: Diagnosis not present

## 2019-11-11 DIAGNOSIS — M25611 Stiffness of right shoulder, not elsewhere classified: Secondary | ICD-10-CM | POA: Diagnosis not present

## 2019-11-11 DIAGNOSIS — R531 Weakness: Secondary | ICD-10-CM | POA: Diagnosis not present

## 2019-11-16 DIAGNOSIS — M25561 Pain in right knee: Secondary | ICD-10-CM | POA: Diagnosis not present

## 2019-11-16 DIAGNOSIS — M25511 Pain in right shoulder: Secondary | ICD-10-CM | POA: Diagnosis not present

## 2019-11-16 DIAGNOSIS — M25611 Stiffness of right shoulder, not elsewhere classified: Secondary | ICD-10-CM | POA: Diagnosis not present

## 2019-11-16 DIAGNOSIS — R531 Weakness: Secondary | ICD-10-CM | POA: Diagnosis not present

## 2019-11-16 DIAGNOSIS — M25661 Stiffness of right knee, not elsewhere classified: Secondary | ICD-10-CM | POA: Diagnosis not present

## 2019-11-17 DIAGNOSIS — R87612 Low grade squamous intraepithelial lesion on cytologic smear of cervix (LGSIL): Secondary | ICD-10-CM | POA: Diagnosis not present

## 2019-11-18 DIAGNOSIS — M25611 Stiffness of right shoulder, not elsewhere classified: Secondary | ICD-10-CM | POA: Diagnosis not present

## 2019-11-18 DIAGNOSIS — R531 Weakness: Secondary | ICD-10-CM | POA: Diagnosis not present

## 2019-11-18 DIAGNOSIS — M25511 Pain in right shoulder: Secondary | ICD-10-CM | POA: Diagnosis not present

## 2019-11-18 DIAGNOSIS — M25561 Pain in right knee: Secondary | ICD-10-CM | POA: Diagnosis not present

## 2019-11-18 DIAGNOSIS — M25661 Stiffness of right knee, not elsewhere classified: Secondary | ICD-10-CM | POA: Diagnosis not present

## 2019-11-23 ENCOUNTER — Encounter: Payer: Self-pay | Admitting: Dermatology

## 2019-11-23 DIAGNOSIS — M25661 Stiffness of right knee, not elsewhere classified: Secondary | ICD-10-CM | POA: Diagnosis not present

## 2019-11-23 DIAGNOSIS — R531 Weakness: Secondary | ICD-10-CM | POA: Diagnosis not present

## 2019-11-23 DIAGNOSIS — M25511 Pain in right shoulder: Secondary | ICD-10-CM | POA: Diagnosis not present

## 2019-11-23 DIAGNOSIS — M25561 Pain in right knee: Secondary | ICD-10-CM | POA: Diagnosis not present

## 2019-11-23 DIAGNOSIS — M25611 Stiffness of right shoulder, not elsewhere classified: Secondary | ICD-10-CM | POA: Diagnosis not present

## 2019-11-29 MED FILL — HYDROCHLOROTHIAZIDE 25 MG T: 25 | 30 days supply | Qty: 30 | Fill #3

## 2019-11-30 DIAGNOSIS — M25511 Pain in right shoulder: Secondary | ICD-10-CM | POA: Diagnosis not present

## 2019-11-30 DIAGNOSIS — R531 Weakness: Secondary | ICD-10-CM | POA: Diagnosis not present

## 2019-11-30 DIAGNOSIS — M25561 Pain in right knee: Secondary | ICD-10-CM | POA: Diagnosis not present

## 2019-11-30 DIAGNOSIS — M25611 Stiffness of right shoulder, not elsewhere classified: Secondary | ICD-10-CM | POA: Diagnosis not present

## 2019-11-30 DIAGNOSIS — M25661 Stiffness of right knee, not elsewhere classified: Secondary | ICD-10-CM | POA: Diagnosis not present

## 2019-12-02 DIAGNOSIS — M25611 Stiffness of right shoulder, not elsewhere classified: Secondary | ICD-10-CM | POA: Diagnosis not present

## 2019-12-02 DIAGNOSIS — M25661 Stiffness of right knee, not elsewhere classified: Secondary | ICD-10-CM | POA: Diagnosis not present

## 2019-12-02 DIAGNOSIS — R531 Weakness: Secondary | ICD-10-CM | POA: Diagnosis not present

## 2019-12-02 DIAGNOSIS — M25511 Pain in right shoulder: Secondary | ICD-10-CM | POA: Diagnosis not present

## 2019-12-02 DIAGNOSIS — M25561 Pain in right knee: Secondary | ICD-10-CM | POA: Diagnosis not present

## 2019-12-13 MED FILL — METHOCARBAMOL 500 MG TABS: 500 | 20 days supply | Qty: 60 | Fill #2

## 2019-12-21 DIAGNOSIS — M25661 Stiffness of right knee, not elsewhere classified: Secondary | ICD-10-CM | POA: Diagnosis not present

## 2019-12-21 DIAGNOSIS — M25511 Pain in right shoulder: Secondary | ICD-10-CM | POA: Diagnosis not present

## 2019-12-21 DIAGNOSIS — R531 Weakness: Secondary | ICD-10-CM | POA: Diagnosis not present

## 2019-12-21 DIAGNOSIS — M25561 Pain in right knee: Secondary | ICD-10-CM | POA: Diagnosis not present

## 2019-12-21 DIAGNOSIS — M25611 Stiffness of right shoulder, not elsewhere classified: Secondary | ICD-10-CM | POA: Diagnosis not present

## 2019-12-28 DIAGNOSIS — M25661 Stiffness of right knee, not elsewhere classified: Secondary | ICD-10-CM | POA: Diagnosis not present

## 2019-12-28 DIAGNOSIS — M25561 Pain in right knee: Secondary | ICD-10-CM | POA: Diagnosis not present

## 2019-12-28 DIAGNOSIS — M25611 Stiffness of right shoulder, not elsewhere classified: Secondary | ICD-10-CM | POA: Diagnosis not present

## 2019-12-28 DIAGNOSIS — R531 Weakness: Secondary | ICD-10-CM | POA: Diagnosis not present

## 2019-12-28 DIAGNOSIS — M25511 Pain in right shoulder: Secondary | ICD-10-CM | POA: Diagnosis not present

## 2019-12-30 DIAGNOSIS — M25561 Pain in right knee: Secondary | ICD-10-CM | POA: Diagnosis not present

## 2019-12-30 DIAGNOSIS — R531 Weakness: Secondary | ICD-10-CM | POA: Diagnosis not present

## 2019-12-30 DIAGNOSIS — M25611 Stiffness of right shoulder, not elsewhere classified: Secondary | ICD-10-CM | POA: Diagnosis not present

## 2019-12-30 DIAGNOSIS — M25511 Pain in right shoulder: Secondary | ICD-10-CM | POA: Diagnosis not present

## 2019-12-30 DIAGNOSIS — M25661 Stiffness of right knee, not elsewhere classified: Secondary | ICD-10-CM | POA: Diagnosis not present

## 2019-12-30 MED FILL — NORETHINDRONE 0.35 MG TABS: 0.35 | 84 days supply | Qty: 84 | Fill #2

## 2020-01-06 ENCOUNTER — Other Ambulatory Visit: Payer: Self-pay | Admitting: Family Medicine

## 2020-01-06 MED FILL — HYDROCHLOROTHIAZIDE 25 MG T: 25 | 30 days supply | Qty: 30 | Fill #0

## 2020-01-12 ENCOUNTER — Ambulatory Visit: Payer: 59 | Admitting: Dermatology

## 2020-01-20 DIAGNOSIS — R531 Weakness: Secondary | ICD-10-CM | POA: Diagnosis not present

## 2020-01-20 DIAGNOSIS — M25561 Pain in right knee: Secondary | ICD-10-CM | POA: Diagnosis not present

## 2020-01-20 DIAGNOSIS — M25611 Stiffness of right shoulder, not elsewhere classified: Secondary | ICD-10-CM | POA: Diagnosis not present

## 2020-01-20 DIAGNOSIS — M25511 Pain in right shoulder: Secondary | ICD-10-CM | POA: Diagnosis not present

## 2020-01-20 DIAGNOSIS — M25661 Stiffness of right knee, not elsewhere classified: Secondary | ICD-10-CM | POA: Diagnosis not present

## 2020-01-25 DIAGNOSIS — M25561 Pain in right knee: Secondary | ICD-10-CM | POA: Diagnosis not present

## 2020-01-25 DIAGNOSIS — M25611 Stiffness of right shoulder, not elsewhere classified: Secondary | ICD-10-CM | POA: Diagnosis not present

## 2020-01-25 DIAGNOSIS — M25661 Stiffness of right knee, not elsewhere classified: Secondary | ICD-10-CM | POA: Diagnosis not present

## 2020-01-25 DIAGNOSIS — R531 Weakness: Secondary | ICD-10-CM | POA: Diagnosis not present

## 2020-01-25 DIAGNOSIS — M25511 Pain in right shoulder: Secondary | ICD-10-CM | POA: Diagnosis not present

## 2020-01-27 DIAGNOSIS — M25561 Pain in right knee: Secondary | ICD-10-CM | POA: Diagnosis not present

## 2020-01-27 DIAGNOSIS — M25511 Pain in right shoulder: Secondary | ICD-10-CM | POA: Diagnosis not present

## 2020-01-27 DIAGNOSIS — M25661 Stiffness of right knee, not elsewhere classified: Secondary | ICD-10-CM | POA: Diagnosis not present

## 2020-01-27 DIAGNOSIS — M25611 Stiffness of right shoulder, not elsewhere classified: Secondary | ICD-10-CM | POA: Diagnosis not present

## 2020-01-27 DIAGNOSIS — R531 Weakness: Secondary | ICD-10-CM | POA: Diagnosis not present

## 2020-01-27 MED FILL — DOXYCYCLINE HYCLATE 20 MG T: 20 | 30 days supply | Qty: 60 | Fill #1

## 2020-01-31 ENCOUNTER — Other Ambulatory Visit: Payer: Self-pay | Admitting: Family Medicine

## 2020-02-01 ENCOUNTER — Other Ambulatory Visit: Payer: Self-pay | Admitting: Family Medicine

## 2020-02-01 MED FILL — METHOCARBAMOL 500 MG TABLET: 500 | 20 days supply | Qty: 60 | Fill #0

## 2020-02-03 DIAGNOSIS — M25561 Pain in right knee: Secondary | ICD-10-CM | POA: Diagnosis not present

## 2020-02-03 DIAGNOSIS — M25611 Stiffness of right shoulder, not elsewhere classified: Secondary | ICD-10-CM | POA: Diagnosis not present

## 2020-02-03 DIAGNOSIS — R531 Weakness: Secondary | ICD-10-CM | POA: Diagnosis not present

## 2020-02-03 DIAGNOSIS — M25661 Stiffness of right knee, not elsewhere classified: Secondary | ICD-10-CM | POA: Diagnosis not present

## 2020-02-03 DIAGNOSIS — M25511 Pain in right shoulder: Secondary | ICD-10-CM | POA: Diagnosis not present

## 2020-02-08 DIAGNOSIS — M25661 Stiffness of right knee, not elsewhere classified: Secondary | ICD-10-CM | POA: Diagnosis not present

## 2020-02-08 DIAGNOSIS — R531 Weakness: Secondary | ICD-10-CM | POA: Diagnosis not present

## 2020-02-08 DIAGNOSIS — M25611 Stiffness of right shoulder, not elsewhere classified: Secondary | ICD-10-CM | POA: Diagnosis not present

## 2020-02-08 DIAGNOSIS — M25561 Pain in right knee: Secondary | ICD-10-CM | POA: Diagnosis not present

## 2020-02-08 DIAGNOSIS — M25511 Pain in right shoulder: Secondary | ICD-10-CM | POA: Diagnosis not present

## 2020-02-10 DIAGNOSIS — M25511 Pain in right shoulder: Secondary | ICD-10-CM | POA: Diagnosis not present

## 2020-02-10 DIAGNOSIS — M25561 Pain in right knee: Secondary | ICD-10-CM | POA: Diagnosis not present

## 2020-02-10 DIAGNOSIS — M25611 Stiffness of right shoulder, not elsewhere classified: Secondary | ICD-10-CM | POA: Diagnosis not present

## 2020-02-10 DIAGNOSIS — R531 Weakness: Secondary | ICD-10-CM | POA: Diagnosis not present

## 2020-02-10 DIAGNOSIS — M25661 Stiffness of right knee, not elsewhere classified: Secondary | ICD-10-CM | POA: Diagnosis not present

## 2020-02-11 MED FILL — HYDROCHLOROTHIAZIDE 25 MG T: 25 | 30 days supply | Qty: 30 | Fill #1

## 2020-02-15 DIAGNOSIS — M25511 Pain in right shoulder: Secondary | ICD-10-CM | POA: Diagnosis not present

## 2020-02-15 DIAGNOSIS — R531 Weakness: Secondary | ICD-10-CM | POA: Diagnosis not present

## 2020-02-15 DIAGNOSIS — M25611 Stiffness of right shoulder, not elsewhere classified: Secondary | ICD-10-CM | POA: Diagnosis not present

## 2020-02-15 DIAGNOSIS — M25561 Pain in right knee: Secondary | ICD-10-CM | POA: Diagnosis not present

## 2020-02-15 DIAGNOSIS — M25661 Stiffness of right knee, not elsewhere classified: Secondary | ICD-10-CM | POA: Diagnosis not present

## 2020-02-22 DIAGNOSIS — M25561 Pain in right knee: Secondary | ICD-10-CM | POA: Diagnosis not present

## 2020-02-22 DIAGNOSIS — R531 Weakness: Secondary | ICD-10-CM | POA: Diagnosis not present

## 2020-02-22 DIAGNOSIS — M25661 Stiffness of right knee, not elsewhere classified: Secondary | ICD-10-CM | POA: Diagnosis not present

## 2020-02-22 DIAGNOSIS — M25511 Pain in right shoulder: Secondary | ICD-10-CM | POA: Diagnosis not present

## 2020-02-22 DIAGNOSIS — M25611 Stiffness of right shoulder, not elsewhere classified: Secondary | ICD-10-CM | POA: Diagnosis not present

## 2020-02-24 DIAGNOSIS — M25511 Pain in right shoulder: Secondary | ICD-10-CM | POA: Diagnosis not present

## 2020-02-24 DIAGNOSIS — M25611 Stiffness of right shoulder, not elsewhere classified: Secondary | ICD-10-CM | POA: Diagnosis not present

## 2020-02-24 DIAGNOSIS — M25661 Stiffness of right knee, not elsewhere classified: Secondary | ICD-10-CM | POA: Diagnosis not present

## 2020-02-24 DIAGNOSIS — M25561 Pain in right knee: Secondary | ICD-10-CM | POA: Diagnosis not present

## 2020-02-24 DIAGNOSIS — R531 Weakness: Secondary | ICD-10-CM | POA: Diagnosis not present

## 2020-02-29 DIAGNOSIS — R531 Weakness: Secondary | ICD-10-CM | POA: Diagnosis not present

## 2020-02-29 DIAGNOSIS — M25611 Stiffness of right shoulder, not elsewhere classified: Secondary | ICD-10-CM | POA: Diagnosis not present

## 2020-02-29 DIAGNOSIS — M25561 Pain in right knee: Secondary | ICD-10-CM | POA: Diagnosis not present

## 2020-02-29 DIAGNOSIS — M25661 Stiffness of right knee, not elsewhere classified: Secondary | ICD-10-CM | POA: Diagnosis not present

## 2020-02-29 DIAGNOSIS — M25511 Pain in right shoulder: Secondary | ICD-10-CM | POA: Diagnosis not present

## 2020-03-02 DIAGNOSIS — M25611 Stiffness of right shoulder, not elsewhere classified: Secondary | ICD-10-CM | POA: Diagnosis not present

## 2020-03-02 DIAGNOSIS — M25661 Stiffness of right knee, not elsewhere classified: Secondary | ICD-10-CM | POA: Diagnosis not present

## 2020-03-02 DIAGNOSIS — R531 Weakness: Secondary | ICD-10-CM | POA: Diagnosis not present

## 2020-03-02 DIAGNOSIS — M25511 Pain in right shoulder: Secondary | ICD-10-CM | POA: Diagnosis not present

## 2020-03-02 DIAGNOSIS — M25561 Pain in right knee: Secondary | ICD-10-CM | POA: Diagnosis not present

## 2020-03-07 DIAGNOSIS — M25511 Pain in right shoulder: Secondary | ICD-10-CM | POA: Diagnosis not present

## 2020-03-07 DIAGNOSIS — M25661 Stiffness of right knee, not elsewhere classified: Secondary | ICD-10-CM | POA: Diagnosis not present

## 2020-03-07 DIAGNOSIS — R531 Weakness: Secondary | ICD-10-CM | POA: Diagnosis not present

## 2020-03-07 DIAGNOSIS — M25561 Pain in right knee: Secondary | ICD-10-CM | POA: Diagnosis not present

## 2020-03-07 DIAGNOSIS — M25611 Stiffness of right shoulder, not elsewhere classified: Secondary | ICD-10-CM | POA: Diagnosis not present

## 2020-03-09 DIAGNOSIS — R531 Weakness: Secondary | ICD-10-CM | POA: Diagnosis not present

## 2020-03-09 DIAGNOSIS — M25611 Stiffness of right shoulder, not elsewhere classified: Secondary | ICD-10-CM | POA: Diagnosis not present

## 2020-03-09 DIAGNOSIS — M25561 Pain in right knee: Secondary | ICD-10-CM | POA: Diagnosis not present

## 2020-03-09 DIAGNOSIS — M25661 Stiffness of right knee, not elsewhere classified: Secondary | ICD-10-CM | POA: Diagnosis not present

## 2020-03-09 DIAGNOSIS — M25511 Pain in right shoulder: Secondary | ICD-10-CM | POA: Diagnosis not present

## 2020-03-15 ENCOUNTER — Other Ambulatory Visit: Payer: Self-pay

## 2020-03-15 ENCOUNTER — Ambulatory Visit: Payer: 59 | Admitting: Dermatology

## 2020-03-15 DIAGNOSIS — L710 Perioral dermatitis: Secondary | ICD-10-CM

## 2020-03-15 DIAGNOSIS — L820 Inflamed seborrheic keratosis: Secondary | ICD-10-CM | POA: Diagnosis not present

## 2020-03-15 NOTE — Progress Notes (Signed)
   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 She is taking doxycycline 20mg , some days only once daily, some days twice as prescribed. ).  The following portions of the chart were reviewed this encounter and updated as appropriate:   Tobacco  Allergies  Meds  Problems  Med Hx  Surg Hx  Fam Hx      Review of Systems:  No other skin or systemic complaints except as noted in HPI or Assessment and Plan.  Objective  Well appearing patient in no apparent distress; mood and affect are within normal limits.  A focused examination was performed including face, right ear. Relevant physical exam findings are noted in the Assessment and Plan.  Objective  perioral: Rare inflammatory papule  Objective  Right concha bowl: Erythematous keratotic or waxy stuck-on papule or plaque.    Assessment & Plan  Perioral dermatitis perioral  Continue doxycycline 20mg  BID with food.  Doxycycline should be taken with food to prevent nausea. Do not lay down for 30 minutes after taking. Be cautious with sun exposure and use good sun protection while on this medication. Pregnant women should not take this medication.   If not improving patient will call and we will send in higher dose of doxycycline (100 mg doxycycline monohydrate twice a day)   Inflamed seborrheic keratosis Right concha bowl  Prior to procedure, discussed risks of blister formation, small wound, skin dyspigmentation, or rare scar following cryotherapy.    Destruction of lesion - Right concha bowl Complexity: simple   Destruction method: cryotherapy   Informed consent: discussed and consent obtained   Lesion destroyed using liquid nitrogen: Yes   Cryotherapy cycles:  2 Outcome: patient tolerated procedure well with no complications   Post-procedure details: wound care instructions given     Return in about 2 months (around 05/15/2020) for perioral dermatitis.  Graciella Belton, RMA, am acting as scribe for Forest Gleason, MD .  Documentation: I have reviewed the above documentation for accuracy and completeness, and I agree with the above.  Forest Gleason, MD

## 2020-03-15 NOTE — Patient Instructions (Addendum)
Doxycycline should be taken with food to prevent nausea. Do not lay down for 30 minutes after taking. Be cautious with sun exposure and use good sun protection while on this medication. Pregnant women should not take this medication.    Cryotherapy Aftercare  . Wash gently with soap and water everyday.   Marland Kitchen Apply Vaseline and Band-Aid daily until healed.  Prior to procedure, discussed risks of blister formation, small wound, skin dyspigmentation, or rare scar following cryotherapy.

## 2020-03-16 ENCOUNTER — Ambulatory Visit: Payer: 59 | Admitting: Dermatology

## 2020-03-16 DIAGNOSIS — R531 Weakness: Secondary | ICD-10-CM | POA: Diagnosis not present

## 2020-03-16 DIAGNOSIS — M25561 Pain in right knee: Secondary | ICD-10-CM | POA: Diagnosis not present

## 2020-03-16 DIAGNOSIS — M25511 Pain in right shoulder: Secondary | ICD-10-CM | POA: Diagnosis not present

## 2020-03-16 DIAGNOSIS — M25661 Stiffness of right knee, not elsewhere classified: Secondary | ICD-10-CM | POA: Diagnosis not present

## 2020-03-16 DIAGNOSIS — M25611 Stiffness of right shoulder, not elsewhere classified: Secondary | ICD-10-CM | POA: Diagnosis not present

## 2020-03-21 ENCOUNTER — Encounter: Payer: Self-pay | Admitting: Dermatology

## 2020-03-21 DIAGNOSIS — M25611 Stiffness of right shoulder, not elsewhere classified: Secondary | ICD-10-CM | POA: Diagnosis not present

## 2020-03-21 DIAGNOSIS — M25511 Pain in right shoulder: Secondary | ICD-10-CM | POA: Diagnosis not present

## 2020-03-21 DIAGNOSIS — R531 Weakness: Secondary | ICD-10-CM | POA: Diagnosis not present

## 2020-03-21 DIAGNOSIS — M25561 Pain in right knee: Secondary | ICD-10-CM | POA: Diagnosis not present

## 2020-03-21 DIAGNOSIS — M25661 Stiffness of right knee, not elsewhere classified: Secondary | ICD-10-CM | POA: Diagnosis not present

## 2020-03-21 MED FILL — HYDROCHLOROTHIAZIDE 25 MG T: 25 | 60 days supply | Qty: 60 | Fill #2

## 2020-03-23 DIAGNOSIS — M25661 Stiffness of right knee, not elsewhere classified: Secondary | ICD-10-CM | POA: Diagnosis not present

## 2020-03-23 DIAGNOSIS — M25611 Stiffness of right shoulder, not elsewhere classified: Secondary | ICD-10-CM | POA: Diagnosis not present

## 2020-03-23 DIAGNOSIS — M25511 Pain in right shoulder: Secondary | ICD-10-CM | POA: Diagnosis not present

## 2020-03-23 DIAGNOSIS — R531 Weakness: Secondary | ICD-10-CM | POA: Diagnosis not present

## 2020-03-23 DIAGNOSIS — M25561 Pain in right knee: Secondary | ICD-10-CM | POA: Diagnosis not present

## 2020-03-28 DIAGNOSIS — M25611 Stiffness of right shoulder, not elsewhere classified: Secondary | ICD-10-CM | POA: Diagnosis not present

## 2020-03-28 DIAGNOSIS — R531 Weakness: Secondary | ICD-10-CM | POA: Diagnosis not present

## 2020-03-28 DIAGNOSIS — M25561 Pain in right knee: Secondary | ICD-10-CM | POA: Diagnosis not present

## 2020-03-28 DIAGNOSIS — M25661 Stiffness of right knee, not elsewhere classified: Secondary | ICD-10-CM | POA: Diagnosis not present

## 2020-03-28 DIAGNOSIS — M25511 Pain in right shoulder: Secondary | ICD-10-CM | POA: Diagnosis not present

## 2020-03-30 DIAGNOSIS — M25661 Stiffness of right knee, not elsewhere classified: Secondary | ICD-10-CM | POA: Diagnosis not present

## 2020-03-30 DIAGNOSIS — M25511 Pain in right shoulder: Secondary | ICD-10-CM | POA: Diagnosis not present

## 2020-03-30 DIAGNOSIS — M25611 Stiffness of right shoulder, not elsewhere classified: Secondary | ICD-10-CM | POA: Diagnosis not present

## 2020-03-30 DIAGNOSIS — M25561 Pain in right knee: Secondary | ICD-10-CM | POA: Diagnosis not present

## 2020-03-30 DIAGNOSIS — R531 Weakness: Secondary | ICD-10-CM | POA: Diagnosis not present

## 2020-04-22 MED FILL — Methocarbamol Tab 500 MG: ORAL | 20 days supply | Qty: 60 | Fill #0 | Status: AC

## 2020-04-24 ENCOUNTER — Other Ambulatory Visit (HOSPITAL_BASED_OUTPATIENT_CLINIC_OR_DEPARTMENT_OTHER): Payer: Self-pay

## 2020-05-02 ENCOUNTER — Other Ambulatory Visit: Payer: Self-pay | Admitting: Internal Medicine

## 2020-05-02 MED ORDER — SCOPOLAMINE 1 MG/3DAYS TD PT72: 1.0000 | MEDICATED_PATCH | TRANSDERMAL | 0 refills | Status: DC

## 2020-05-02 NOTE — Progress Notes (Signed)
Patient going on a trip, scopolamine patch sent, recommend to try before the trip to be sure she tolerates.

## 2020-05-15 ENCOUNTER — Other Ambulatory Visit (HOSPITAL_BASED_OUTPATIENT_CLINIC_OR_DEPARTMENT_OTHER): Payer: Self-pay

## 2020-05-15 ENCOUNTER — Other Ambulatory Visit: Payer: Self-pay | Admitting: Dermatology

## 2020-05-15 DIAGNOSIS — L710 Perioral dermatitis: Secondary | ICD-10-CM

## 2020-05-15 MED ORDER — DOXYCYCLINE HYCLATE 20 MG PO TABS
ORAL_TABLET | ORAL | 2 refills | Status: AC
  Filled 2020-05-15: qty 60, 30d supply, fill #0
  Filled 2020-06-23: qty 60, 30d supply, fill #1

## 2020-05-16 ENCOUNTER — Other Ambulatory Visit: Payer: Self-pay | Admitting: Obstetrics and Gynecology

## 2020-05-16 DIAGNOSIS — Z1231 Encounter for screening mammogram for malignant neoplasm of breast: Secondary | ICD-10-CM

## 2020-05-21 ENCOUNTER — Encounter: Payer: Self-pay | Admitting: Family Medicine

## 2020-05-21 DIAGNOSIS — Z20822 Contact with and (suspected) exposure to covid-19: Secondary | ICD-10-CM | POA: Diagnosis not present

## 2020-05-22 ENCOUNTER — Other Ambulatory Visit: Payer: Self-pay

## 2020-05-22 ENCOUNTER — Ambulatory Visit (INDEPENDENT_AMBULATORY_CARE_PROVIDER_SITE_OTHER): Payer: 59 | Admitting: Family Medicine

## 2020-05-22 ENCOUNTER — Other Ambulatory Visit (HOSPITAL_COMMUNITY): Payer: Self-pay

## 2020-05-22 ENCOUNTER — Encounter: Payer: Self-pay | Admitting: Family Medicine

## 2020-05-22 DIAGNOSIS — U071 COVID-19: Secondary | ICD-10-CM | POA: Diagnosis not present

## 2020-05-22 DIAGNOSIS — Z8616 Personal history of COVID-19: Secondary | ICD-10-CM | POA: Insufficient documentation

## 2020-05-22 MED ORDER — PREDNISONE 10 MG PO TABS
ORAL_TABLET | ORAL | 0 refills | Status: DC
  Filled 2020-05-22: qty 18, 9d supply, fill #0

## 2020-05-22 MED ORDER — NIRMATRELVIR/RITONAVIR (PAXLOVID)TABLET
3.0000 | ORAL_TABLET | Freq: Two times a day (BID) | ORAL | 0 refills | Status: AC
  Filled 2020-05-22: qty 30, 5d supply, fill #0

## 2020-05-22 MED ORDER — PROMETHAZINE-DM 6.25-15 MG/5ML PO SYRP
5.0000 mL | ORAL_SOLUTION | Freq: Four times a day (QID) | ORAL | 0 refills | Status: DC | PRN
  Filled 2020-05-22: qty 118, 6d supply, fill #0

## 2020-05-22 MED ORDER — ALBUTEROL SULFATE HFA 108 (90 BASE) MCG/ACT IN AERS
2.0000 | INHALATION_SPRAY | RESPIRATORY_TRACT | 1 refills | Status: DC | PRN
  Filled 2020-05-22: qty 18, 16d supply, fill #0

## 2020-05-22 NOTE — Patient Instructions (Signed)
For covid-take the Paxlovid as directed  Salt water gargle for sore throat and tylenol Drink fluids and rest  mucinex to expectorate prometh DM as needed for cough (caution can sedate)  Prednisone for tight chest and congestion and sore throat Nasal saline for congestion as needed  Tylenol for fever or pain or headache  Please alert [place] if symptoms worsen (if severe or short of breath please go to the ER)

## 2020-05-22 NOTE — Progress Notes (Signed)
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  Location: Patient: home Provider: office   I discussed the limitations of evaluation and management by telemedicine and the availability of in person appointments. The patient expressed understanding and agreed to proceed.  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 Routine general medical examination at a health care facility 06/02/2019  . Obesity (BMI 30-39.9) 06/02/2019  . Irregular menses 06/02/2019  . Colon cancer screening 06/11/2018  . Knee pain, bilateral 02/24/2018  . Low back pain 12/27/2015  . Lumbar degenerative disc disease 12/27/2015  . Plantar fasciitis, left 06/08/2014  . S/P myomectomy 10/18/2013  . Acute superficial venous thrombosis of lower extremity 06/03/2012  . Lupus anticoagulant positive 06/03/2012  . Hyperlipidemia 11/04/2006  . ADD 11/04/2006  . ACNE NEC 11/04/2006   Past Medical History:  Diagnosis Date  . Actinic keratosis 09/01/2019   L lat breast - bx proven  . Edema, lower extremity    takes HCTZ as needed  . Endometrial polyp   . Exercise-induced asthma    per pt has used inhaler in yrs  .  History of anemia   . History of thrombophlebitis    per pt approx. 2015 right lower leg superficial   . PONV (postoperative nausea and vomiting)   . Uterine fibroid   . Wears glasses    Past Surgical History:  Procedure Laterality Date  . ANTERIOR FUSION LUMBAR SPINE  05-18-2007   dr Vertell Limber / dr Amedeo Plenty  @[facility]    re-do diskectomy/ dissection L5 -S1 and fusion  . APPENDECTOMY  age 10  . DILITATION & CURRETTAGE/HYSTROSCOPY WITH HYDROTHERMAL ABLATION N/A 10/30/2018   Procedure: DILATATION & CURETTAGE/HYSTEROSCOPY WITH HYDROTHERMAL ABLATION;  Surgeon: Dian Queen, MD;  Location: [facility];  Service: Gynecology;  Laterality: N/A;  . HYSTEROSCOPY WITH D & C N/A 11/07/2017   Procedure: DILATATION AND CURETTAGE /HYSTEROSCOPY;  Surgeon: Dian Queen, MD;  Location: [facility];  Service: Gynecology;  Laterality: N/A;  . LAPAROTOMY  age 20   for ovarian cyst  . LUMBAR [place] SURGERY  11-07-2006;  12-26-2006  dr Vertell Limber @[facility]    L5 -- S1  . MYOMECTOMY N/A 10/18/2013   Procedure: ABDOMINAL MYOMECTOMY;  Surgeon: Cyril Mourning, MD;  Location: [facility];  Service: Gynecology;  Laterality: N/A;  . REDUCTION MAMMAPLASTY Bilateral 1995  . 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 [place]  . TYMPANOSTOMY TUBE PLACEMENT Bilateral child   Social History   Tobacco Use  . Smoking status:  Never Smoker  . Smokeless tobacco: Never Used  Vaping Use  . Vaping Use: Never used  Substance Use Topics  . Alcohol use: Yes    Comment: occasional  . Drug use: Never   Family History  Problem Relation Age of Onset  . Hypertension Mother   . Hyperlipidemia Father   . COPD Father   . Colon cancer Neg Hx   . Colon polyps Neg Hx   . Esophageal cancer Neg Hx   . Rectal cancer Neg Hx   . Stomach cancer Neg Hx    Allergies  Allergen Reactions  . Levofloxacin Other (See Comments) and Nausea Only    REACTION: abdiminal pain  High Dose 750 mg, ok with 500 mg  dose  . Tape Rash    blister   Current Outpatient Medications on File Prior to Visit  Medication Sig Dispense Refill  . doxycycline (PERIOSTAT) 20 MG tablet TAKE 1 TABLET (20 MG TOTAL) BY MOUTH 2 (TWO) TIMES DAILY. TAKE WITH FOOD 60 tablet 2  . hydrochlorothiazide (HYDRODIURIL) 25 MG tablet TAKE 1 TABLET BY MOUTH ONCE DAILY AS NEEDED EDEMA 30 tablet 3  . ibuprofen (ADVIL) 200 MG tablet Take 200 mg by mouth every 6 (six) hours as needed.    Marland Kitchen levocetirizine (XYZAL) 5 MG tablet Take 5 mg by mouth daily as needed.    . methocarbamol (ROBAXIN) 500 MG tablet TAKE 1 TABLET BY MOUTH EVERY 8 HOURS AS NEEDED FOR MUSCLE SPASMS 60 tablet 2  . norethindrone (MICRONOR) 0.35 MG tablet TAKE 1 TABLET (0.35 MG TOTAL) BY MOUTH DAILY. 84 tablet 3   No current facility-administered medications on file prior to visit.   Review of Systems  Constitutional: Positive for malaise/fatigue. Negative for chills and fever.  HENT: Positive for congestion and sore throat. Negative for ear pain and sinus pain.   Eyes: Negative for blurred vision, discharge and redness.  Respiratory: Positive for cough and sputum production. Negative for shortness of breath, wheezing and stridor.        Occ feels tight in lungs, no overt wheezing  Cardiovascular: Negative for chest pain, palpitations and leg swelling.  Gastrointestinal: Negative for abdominal pain, diarrhea, nausea and vomiting.  Musculoskeletal: Negative for myalgias.  Skin: Negative for rash.  Neurological: Negative for dizziness and headaches.    Observations/Objective: Patient appears well, in no distress Weight is baseline  No facial swelling or asymmetry Voice is hoarse and congested sounding No obvious tremor or mobility impairment Moving neck and UEs normally Able to hear the call well  No cough or shortness of breath during interview  (no audible wheeze) Talkative and mentally sharp with no cognitive changes No skin changes on face or neck , no rash or  pallor Affect is normal    Assessment and Plan: Problem List Items Addressed This Visit      Other   COVID-19    Mild to moderate respiratory symptoms Nasal/chest congestion with ST Symptom care discussed pred taper 30 mg and albuterol mdi sent to pharmacy along with prometh-dm for cough prn with caution of sedation paxlovid px as well ER parameters noted/understood Update if not starting to improve in a week or if worsening        Relevant Medications   nirmatrelvir/ritonavir EUA (PAXLOVID) TABS       Follow Up Instructions: For covid-take the Paxlovid as directed  Salt water gargle for sore throat and tylenol Drink fluids and rest  mucinex to expectorate prometh DM as needed for  cough (caution can sedate)  Prednisone for tight chest and congestion and sore throat Nasal saline for congestion as needed  Tylenol for fever or pain or headache  Please alert [place] if symptoms worsen (if severe or short of breath please go to the ER)     I discussed the assessment and treatment plan with the patient. The patient was provided an opportunity to ask questions and all were answered. The patient agreed with the plan and demonstrated an understanding of the instructions.   The patient was advised to call back or seek an in-person evaluation if the symptoms worsen or if the condition fails to improve as anticipated.     Loura Pardon, MD

## 2020-05-22 NOTE — Assessment & Plan Note (Signed)
Mild to moderate respiratory symptoms Nasal/chest congestion with ST Symptom care discussed pred taper 30 mg and albuterol mdi sent to pharmacy along with prometh-dm for cough prn with caution of sedation paxlovid px as well ER parameters noted/understood Update if not starting to improve in a week or if worsening

## 2020-05-24 ENCOUNTER — Ambulatory Visit: Payer: 59 | Admitting: Dermatology

## 2020-06-02 ENCOUNTER — Other Ambulatory Visit: Payer: Self-pay | Admitting: Family Medicine

## 2020-06-02 ENCOUNTER — Other Ambulatory Visit (HOSPITAL_BASED_OUTPATIENT_CLINIC_OR_DEPARTMENT_OTHER): Payer: Self-pay

## 2020-06-02 MED ORDER — HYDROCHLOROTHIAZIDE 25 MG PO TABS
ORAL_TABLET | ORAL | 3 refills | Status: DC
  Filled 2020-06-02: qty 90, 90d supply, fill #0
  Filled 2020-09-08: qty 30, 30d supply, fill #1

## 2020-06-05 ENCOUNTER — Encounter: Payer: Self-pay | Admitting: Family Medicine

## 2020-06-05 ENCOUNTER — Ambulatory Visit: Payer: 59 | Admitting: Family Medicine

## 2020-06-05 ENCOUNTER — Other Ambulatory Visit (HOSPITAL_BASED_OUTPATIENT_CLINIC_OR_DEPARTMENT_OTHER): Payer: Self-pay

## 2020-06-05 ENCOUNTER — Other Ambulatory Visit: Payer: Self-pay

## 2020-06-05 ENCOUNTER — Ambulatory Visit: Payer: Self-pay

## 2020-06-05 VITALS — BP 122/84 | HR 72 | Ht 64.5 in | Wt 240.8 lb

## 2020-06-05 DIAGNOSIS — S76302A Unspecified injury of muscle, fascia and tendon of the posterior muscle group at thigh level, left thigh, initial encounter: Secondary | ICD-10-CM

## 2020-06-05 DIAGNOSIS — M545 Low back pain, unspecified: Secondary | ICD-10-CM

## 2020-06-05 MED ORDER — TIZANIDINE HCL 4 MG PO TABS
4.0000 mg | ORAL_TABLET | Freq: Three times a day (TID) | ORAL | 1 refills | Status: DC | PRN
  Filled 2020-06-05: qty 30, 10d supply, fill #0
  Filled 2020-06-27: qty 30, 10d supply, fill #1

## 2020-06-05 MED ORDER — CELECOXIB 100 MG PO CAPS
100.0000 mg | ORAL_CAPSULE | Freq: Two times a day (BID) | ORAL | 1 refills | Status: DC | PRN
  Filled 2020-06-05: qty 60, 30d supply, fill #0

## 2020-06-05 NOTE — Progress Notes (Signed)
   I, [name], LAT, ATC, am serving as scribe for Dr. Lynne Leader.  Subjective:    CC: L leg pain  HPI: Pt is a 55 y/o female c/o L leg pain since last night when she slipped getting out of the shower and felt a pop in her L glute.  Pt locates her pain to her L proximal h/s / distal glute.  She has pain with sitting and with standing.  Pain is bothersome enough that she is having trouble working.  Swelling: Unknown Aggravating factors: L knee flexion resisted ROM; transitioning from sitting to standing and vice-versa; pressure to the area Treatments tried: ice; IBU; Robaxin  Pertinent review of Systems: No fevers or chills  Relevant historical information: History of lumbar radiculopathy and lumbar decompression.  History of Roux-en-Y bypass   Objective:    Vitals:   06/05/20 1054  BP: 122/84  Pulse: 72  SpO2: 97%   General: Well Developed, well nourished, and in no acute distress.   MSK: Left hip normal-appearing Normal hip motion. Pain with resisted knee flexion. Tender palpation ischial tuberosity and along course of piriformis muscle. Strength intact however some pain present with resisted hip abduction and external rotation. Pulses cap refill and sensation are intact distally. No palpable defect present in proximal hamstring tendon.    Impression and Recommendations:    Assessment and Plan: 55 y.o. female with left hamstring strain.  Doubtful for severe tear based on physical exam today.  Plan for physical therapy and home exercise program as taught in clinic today by ATC.  Discussed medication management.  She has been using some ibuprofen which is understandable given how bad she hurts.  However she does have a history of Roux-en-Y bypass so would like to avoid NSAIDs if possible.  If she is going to use NSAIDs I have prescribed Celebrex so that she can use it sparingly.  Methocarbamol has been somewhat mediocre.  We will try tizanidine at bedtime. Physical  therapy however will be main treatment methodology. Recheck back in about a month.  Return sooner if needed.  PDMP not reviewed this encounter. Orders Placed This Encounter  Procedures  . Ambulatory referral to Physical Therapy    Referral Priority:   Routine    Referral Type:   Physical Medicine    Referral Reason:   Specialty Services Required    Requested Specialty:   Physical Therapy    Number of Visits Requested:   1   Meds ordered this encounter  Medications  . celecoxib (CELEBREX) 100 MG capsule    Sig: Take 1 capsule (100 mg total) by mouth 2 (two) times daily as needed.    Dispense:  60 capsule    Refill:  1  . tiZANidine (ZANAFLEX) 4 MG tablet    Sig: Take 1 tablet (4 mg total) by mouth every 8 (eight) hours as needed for muscle spasms.    Dispense:  30 tablet    Refill:  1    Discussed warning signs or symptoms. Please see discharge instructions. Patient expresses understanding.   The above documentation has been reviewed and is accurate and complete Lynne Leader, M.D.

## 2020-06-05 NOTE — Patient Instructions (Addendum)
Thank you for coming in today.  Use one of the muscle relaxers mostly at bedtime.   Try some sort of doughnut cushion.   Please complete the exercises that the athletic trainer went over with you: View at [email or web address] using code: [email or web address]  The Askling L Protocol for Hamstring Strains  Lyman Bishop PT  Recheck in about 4 weeks.   Let me know if you are not doing well. We can do more.   I've referred you to Physical Therapy.  Let us know if you don't hear from them in one week.

## 2020-06-14 ENCOUNTER — Other Ambulatory Visit: Payer: 59

## 2020-06-14 ENCOUNTER — Other Ambulatory Visit: Payer: Self-pay

## 2020-06-14 ENCOUNTER — Ambulatory Visit (INDEPENDENT_AMBULATORY_CARE_PROVIDER_SITE_OTHER): Payer: 59 | Admitting: Family Medicine

## 2020-06-14 ENCOUNTER — Ambulatory Visit: Payer: 59 | Admitting: Family Medicine

## 2020-06-14 ENCOUNTER — Other Ambulatory Visit (HOSPITAL_BASED_OUTPATIENT_CLINIC_OR_DEPARTMENT_OTHER): Payer: Self-pay

## 2020-06-14 ENCOUNTER — Encounter: Payer: Self-pay | Admitting: Family Medicine

## 2020-06-14 VITALS — BP 112/68 | HR 100 | Temp 97.0°F | Ht 64.5 in | Wt 238.3 lb

## 2020-06-14 DIAGNOSIS — E7800 Pure hypercholesterolemia, unspecified: Secondary | ICD-10-CM

## 2020-06-14 DIAGNOSIS — N951 Menopausal and female climacteric states: Secondary | ICD-10-CM | POA: Diagnosis not present

## 2020-06-14 DIAGNOSIS — Z6841 Body Mass Index (BMI) 40.0 and over, adult: Secondary | ICD-10-CM | POA: Diagnosis not present

## 2020-06-14 DIAGNOSIS — Z9884 Bariatric surgery status: Secondary | ICD-10-CM | POA: Diagnosis not present

## 2020-06-14 DIAGNOSIS — E538 Deficiency of other specified B group vitamins: Secondary | ICD-10-CM | POA: Diagnosis not present

## 2020-06-14 DIAGNOSIS — E559 Vitamin D deficiency, unspecified: Secondary | ICD-10-CM | POA: Diagnosis not present

## 2020-06-14 DIAGNOSIS — Z0000 Encounter for general adult medical examination without abnormal findings: Secondary | ICD-10-CM | POA: Diagnosis not present

## 2020-06-14 MED ORDER — GABAPENTIN 100 MG PO CAPS
100.0000 mg | ORAL_CAPSULE | Freq: Two times a day (BID) | ORAL | 3 refills | Status: DC
  Filled 2020-06-14: qty 60, 30d supply, fill #0
  Filled 2020-07-18: qty 60, 30d supply, fill #1

## 2020-06-14 MED ORDER — NORETHINDRONE 0.35 MG PO TABS
1.0000 | ORAL_TABLET | Freq: Every day | ORAL | 3 refills | Status: DC
  Filled 2020-06-14: qty 84, 84d supply, fill #0

## 2020-06-14 NOTE — Progress Notes (Signed)
Subjective:    Patient ID: Madeline Held, MD, female    DOB: Oct 20, 1965, 55 y.o.   MRN: [id number]  This visit occurred during the SARS-CoV-2 public health emergency.  Safety protocols were in place, including screening questions prior to the visit, additional usage of staff PPE, and extensive cleaning of exam room while observing appropriate contact time as indicated for disinfecting solutions.    HPI Here for health maintenance exam and to review chronic medical problems    Wt Readings from Last 3 Encounters:  06/14/20 238 lb 5 oz (108.1 kg)  06/05/20 240 lb 12.8 oz (109.2 kg)  06/02/19 235 lb (106.6 kg)   40.27 kg/m  Lost 50 lb and gained 10 back with vacation  opta via   Had a recent hamstring injury (? Partial tear)  Really uncomfortable to sit  Methocarbamol , tizanidine  Starting PT Back bothers her a bit   Was about to go back to the gym  Is motivated   covid immunized (recently had covid as well)  Flu shot-fall Tdap 7/18 shingrix -utd   Pap/gyn care  Ablation in the past -no menses since  Taking micronor  Having hot flashes and hot natured  Some fatigue  More pain    Mammogram 6/21 (scheduled for 07/12/20) Self breast exam- no lumps   Colonoscopy 7/20   Labs ([facility]): Glucose 95 Cr 0.79 GFR 89 Nl LFTs and lytes   Tot cholesterol 284 Trig 75 HDL 88 LDL 184   She was doing the opta via program  Low carb, high protein  Not a lot of fat  Has had a vacation  Not a lot of sat fats /trans fats   She eats healthy fats   Sister- is on cholesterol med Dad had high cholesterol   TSH 3.17  Wbc 6.8 Hb 14.5 Platelet 349 Nl diff  A1c is 5.1   Takes sporatic ca and D   Likes to exercise when she can   Very tired lately  May be a variety of factors including stress, recent covid and menopause Also past bariatric surgery and not always good with supplements ie: D , B12, iron  Patient Active Problem List   Diagnosis Date Noted  .  Bariatric surgery status 06/14/2020  . B12 deficiency 06/14/2020  . Vitamin D deficiency 06/14/2020  . History of COVID-19 05/22/2020  . BMI 40.0-44.9, adult ([place]) 06/02/2019  . Irregular menses 06/02/2019  . Colon cancer screening 06/11/2018  . Knee pain, bilateral 02/24/2018  . Low back pain 12/27/2015  . Lumbar degenerative disc disease 12/27/2015  . Plantar fasciitis, left 06/08/2014  . S/P myomectomy 10/18/2013  . Acute superficial venous thrombosis of lower extremity 06/03/2012  . Lupus anticoagulant positive 06/03/2012  . Hyperlipidemia 11/04/2006  . ADD 11/04/2006  . ACNE NEC 11/04/2006   Past Medical History:  Diagnosis Date  . Actinic keratosis 09/01/2019   L lat breast - bx proven  . Edema, lower extremity    takes HCTZ as needed  . Endometrial polyp   . Exercise-induced asthma    per pt has used inhaler in yrs  . History of anemia   . History of thrombophlebitis    per pt approx. 2015 right lower leg superficial   . PONV (postoperative nausea and vomiting)   . Uterine fibroid   . Wears glasses    Past Surgical History:  Procedure Laterality Date  . ANTERIOR FUSION LUMBAR SPINE  05-18-2007   dr Vertell Limber / dr Amedeo Plenty  @  [facility]   re-do diskectomy/ dissection L5 -S1 and fusion  . APPENDECTOMY  age 33  . DILITATION & CURRETTAGE/HYSTROSCOPY WITH HYDROTHERMAL ABLATION N/A 10/30/2018   Procedure: DILATATION & CURETTAGE/HYSTEROSCOPY WITH HYDROTHERMAL ABLATION;  Surgeon: Dian Queen, MD;  Location: [facility];  Service: Gynecology;  Laterality: N/A;  . HYSTEROSCOPY WITH D & C N/A 11/07/2017   Procedure: DILATATION AND CURETTAGE /HYSTEROSCOPY;  Surgeon: Dian Queen, MD;  Location: [facility];  Service: Gynecology;  Laterality: N/A;  . LAPAROTOMY  age 58   for ovarian cyst  . LUMBAR DISC SURGERY  11-07-2006;  12-26-2006  dr Vertell Limber @[facility]    L5 -- S1  . MYOMECTOMY N/A 10/18/2013   Procedure: ABDOMINAL MYOMECTOMY;  Surgeon: Cyril Mourning, MD;   Location: [facility];  Service: Gynecology;  Laterality: N/A;  . REDUCTION MAMMAPLASTY Bilateral 1995  . 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 [place]  . TYMPANOSTOMY TUBE PLACEMENT Bilateral child   Social History   Tobacco Use  . Smoking status: Never Smoker  . Smokeless tobacco: Never Used  Vaping Use  . Vaping Use: Never used  Substance Use Topics  . Alcohol use: Yes    Comment: occasional  . Drug use: Never   Family History  Problem Relation Age of Onset  . Hypertension Mother   . Hyperlipidemia Father   . COPD Father   . Colon cancer Neg Hx   . Colon polyps Neg Hx   . Esophageal cancer Neg Hx   . Rectal cancer Neg Hx   . Stomach cancer Neg Hx    Allergies  Allergen Reactions  . Levofloxacin Other (See Comments) and Nausea Only    REACTION: abdiminal pain  High Dose 750 mg, ok with 500 mg dose  . Tape Rash    blister   Current Outpatient Medications on File Prior to Visit  Medication Sig Dispense Refill  . albuterol (VENTOLIN HFA) 108 (90 Base) MCG/ACT inhaler Inhale 2 puffs into the lungs every 4 (four) hours as needed for wheezing. 18 g 1  . celecoxib (CELEBREX) 100 MG capsule Take 1 capsule (100 mg total) by mouth 2 (two) times daily as needed. 60 capsule 1  . doxycycline (PERIOSTAT) 20 MG tablet TAKE 1 TABLET (20 MG TOTAL) BY MOUTH 2 (TWO) TIMES DAILY. TAKE WITH FOOD 60 tablet 2  . hydrochlorothiazide (HYDRODIURIL) 25 MG tablet TAKE 1 TABLET BY MOUTH ONCE DAILY AS NEEDED EDEMA 30 tablet 3  . ibuprofen (ADVIL) 200 MG tablet Take 200 mg by mouth every 6 (six) hours as needed.    Marland Kitchen levocetirizine (XYZAL) 5 MG tablet Take 5 mg by mouth daily as needed.    Marland Kitchen tiZANidine (ZANAFLEX) 4 MG tablet Take 1 tablet (4 mg total) by mouth every 8 (eight) hours as needed for muscle spasms. 30 tablet 1  . methocarbamol (ROBAXIN) 500 MG tablet TAKE 1 TABLET BY MOUTH EVERY 8 HOURS AS NEEDED FOR MUSCLE SPASMS (Patient not taking: Reported on 06/14/2020) 60  tablet 2   No current facility-administered medications on file prior to visit.    Review of Systems  Constitutional: Positive for fatigue. Negative for activity change, appetite change, fever and unexpected weight change.  HENT: Negative for congestion, ear pain, rhinorrhea, sinus pressure and sore throat.   Eyes: Negative for pain, redness and visual disturbance.  Respiratory: Negative for cough, shortness of breath and wheezing.   Cardiovascular: Negative for chest pain and palpitations.  Gastrointestinal:  Negative for abdominal pain, blood in stool, constipation and diarrhea.  Endocrine: Positive for heat intolerance. Negative for polydipsia and polyuria.  Genitourinary: Negative for dysuria, frequency and urgency.  Musculoskeletal: Positive for arthralgias. Negative for back pain and myalgias.  Skin: Negative for pallor and rash.  Allergic/Immunologic: Negative for environmental allergies.  Neurological: Negative for dizziness, syncope and headaches.  Hematological: Negative for adenopathy. Does not bruise/bleed easily.  Psychiatric/Behavioral: Negative for decreased concentration and dysphoric mood. The patient is not nervous/anxious.        Objective:   Physical Exam Constitutional:      General: She is not in acute distress.    Appearance: Normal appearance. She is well-developed. She is obese. She is not ill-appearing or diaphoretic.  HENT:     Head: Normocephalic and atraumatic.     Right Ear: Tympanic membrane, ear canal and external ear normal.     Left Ear: Tympanic membrane, ear canal and external ear normal.     Nose: Nose normal. No congestion.     Mouth/Throat:     Mouth: Mucous membranes are moist.     Pharynx: Oropharynx is clear. No posterior oropharyngeal erythema.  Eyes:     General: No scleral icterus.    Extraocular Movements: Extraocular movements intact.     Conjunctiva/sclera: Conjunctivae normal.     Pupils: Pupils are equal, round, and reactive to  light.  Neck:     Thyroid: No thyromegaly.     Vascular: No carotid bruit or JVD.  Cardiovascular:     Rate and Rhythm: Normal rate and regular rhythm.     Pulses: Normal pulses.     Heart sounds: Normal heart sounds. No gallop.   Pulmonary:     Effort: Pulmonary effort is normal. No respiratory distress.     Breath sounds: Normal breath sounds. No wheezing.     Comments: Good air exch Chest:     Chest wall: No tenderness.  Abdominal:     General: Bowel sounds are normal. There is no distension or abdominal bruit.     Palpations: Abdomen is soft. There is no mass.     Tenderness: There is no abdominal tenderness.     Hernia: No hernia is present.  Genitourinary:    Comments: Breast and pelvic exam done by gyn  Musculoskeletal:        General: No tenderness. Normal range of motion.     Cervical back: Normal range of motion and neck supple. No rigidity. No muscular tenderness.     Right lower leg: No edema.     Left lower leg: No edema.  Lymphadenopathy:     Cervical: No cervical adenopathy.  Skin:    General: Skin is warm and dry.     Coloration: Skin is not pale.     Findings: No erythema or rash.     Comments: Solar lentigines diffusely   Neurological:     Mental Status: She is alert. Mental status is at baseline.     Cranial Nerves: No cranial nerve deficit.     Motor: No abnormal muscle tone.     Coordination: Coordination normal.     Gait: Gait normal.     Deep Tendon Reflexes: Reflexes are normal and symmetric. Reflexes normal.  Psychiatric:        Mood and Affect: Mood normal.        Cognition and Memory: Cognition and memory normal.           Assessment & Plan:  Problem List Items Addressed This Visit      Other   Hyperlipidemia    Disc goals for lipids and reasons to control them Rev last labs with pt Rev low sat fat diet in detail  LDL is quite high at 184 She is back on trac with diet  Re check 2 mo  Pt has a fam h/o hyperlipidemia and may  require statin       BMI 40.0-44.9, adult ([facility])    Discussed how this problem influences overall health and the risks it imposes  Reviewed plan for weight loss with lower calorie diet (via better food choices and also portion control or program like weight watchers) and exercise building up to or more than 30 minutes 5 days per week including some aerobic activity   Working with the opta via program and doing well  Will add back exercise when done with rehab from hamstring injury      Bariatric surgery status    Will plan to check B12 and D and iron levels with next lab in 2 mo  Enc her to get back on track with supplements       B12 deficiency    In light of fatigue will check B12 at next lab appt in 2 mo  Enc her to start back on supplementation  Has h/o bariatric surgery      Vitamin D deficiency    Disc imp of D to bone and overall health  Pt has h/o bariatric surgery  Urged to get back on supplements  Re check level at 2 mo lab upcoming       Routine general medical examination at a health care facility - Primary    Reviewed health habits including diet and exercise and skin cancer prevention Reviewed appropriate screening tests for age  Also reviewed health mt list, fam hx and immunization status , as well as social and family history   See HPI Doing well with wt loss  covid immunized  imms utd  Pap is up to date/gyn care  Mammogram utd and scheduled for later this mo Colonoscopy is utd Recent labs reviewed and sent for scanning        Vasomotor symptoms due to menopause    With some fatigue  Will try gabapentin for hot flashes at 100 mg bid and titrate if tolerated Disc potential for sedation  Not candidate for estrogen tx due to clot hx

## 2020-06-14 NOTE — Assessment & Plan Note (Signed)
Will plan to check B12 and D and iron levels with next lab in 2 mo  Enc her to get back on track with supplements

## 2020-06-14 NOTE — Assessment & Plan Note (Signed)
In light of fatigue will check B12 at next lab appt in 2 mo  Enc her to start back on supplementation  Has h/o bariatric surgery

## 2020-06-14 NOTE — Assessment & Plan Note (Signed)
Disc imp of D to bone and overall health  Pt has h/o bariatric surgery  Urged to get back on supplements  Re check level at 2 mo lab upcoming

## 2020-06-14 NOTE — Assessment & Plan Note (Signed)
Discussed how this problem influences overall health and the risks it imposes  Reviewed plan for weight loss with lower calorie diet (via better food choices and also portion control or program like weight watchers) and exercise building up to or more than 30 minutes 5 days per week including some aerobic activity   Working with the opta via program and doing well  Will add back exercise when done with rehab from hamstring injury

## 2020-06-14 NOTE — Assessment & Plan Note (Signed)
Disc goals for lipids and reasons to control them Rev last labs with pt Rev low sat fat diet in detail  LDL is quite high at 184 She is back on trac with diet  Re check 2 mo  Pt has a fam h/o hyperlipidemia and may require statin

## 2020-06-14 NOTE — Assessment & Plan Note (Signed)
With some fatigue  Will try gabapentin for hot flashes at 100 mg bid and titrate if tolerated Disc potential for sedation  Not candidate for estrogen tx due to clot hx

## 2020-06-14 NOTE — Patient Instructions (Addendum)
Let's check lipid in 2 months -I will put the order in   Avoid red meat/ fried foods/ egg yolks/ fatty breakfast meats/ butter, cheese and high fat dairy/ and shellfish    Let's try gabapentin for hot flashes  100 mg twice daily to start and then we can titrate up if needed  Black cohash is my go to to try  Try to get 1200-1500 mg of calcium per day with at least 2000 iu of vitamin D - for bone health   Good luck with the PT

## 2020-06-14 NOTE — Assessment & Plan Note (Signed)
Reviewed health habits including diet and exercise and skin cancer prevention Reviewed appropriate screening tests for age  Also reviewed health mt list, fam hx and immunization status , as well as social and family history   See HPI Doing well with wt loss  covid immunized  imms utd  Pap is up to date/gyn care  Mammogram utd and scheduled for later this mo Colonoscopy is utd Recent labs reviewed and sent for scanning

## 2020-06-16 ENCOUNTER — Ambulatory Visit: Payer: 59 | Attending: Internal Medicine

## 2020-06-16 DIAGNOSIS — Z23 Encounter for immunization: Secondary | ICD-10-CM

## 2020-06-16 NOTE — Progress Notes (Signed)
   Covid-19 Vaccination Clinic  Name:  MERRIN MCVICKER, MD    MRN: [id number] DOB: 1965/02/23  06/16/2020  [name] was observed post Covid-19 immunization for 15 minutes without incident. She was provided with Vaccine Information Sheet and instruction to access the V-Safe system.   Ms. Carollee Herter was instructed to call 911 with any severe reactions post vaccine: Marland Kitchen Difficulty breathing  . Swelling of face and throat  . A fast heartbeat  . A bad rash all over body  . Dizziness and weakness   Immunizations Administered    Name Date Dose VIS Date Route   PFIZER Comrnaty(Gray TOP) Covid-19 Vaccine 06/16/2020 12:22 PM 0.3 mL 12/23/2019 Intramuscular   Manufacturer: [employer]   Lot: [id number]   [place]: [phone]

## 2020-06-20 ENCOUNTER — Other Ambulatory Visit (HOSPITAL_BASED_OUTPATIENT_CLINIC_OR_DEPARTMENT_OTHER): Payer: Self-pay

## 2020-06-20 ENCOUNTER — Encounter: Payer: 59 | Admitting: Family Medicine

## 2020-06-20 MED ORDER — PFIZER-BIONT COVID-19 VAC-TRIS 30 MCG/0.3ML IM SUSP
INTRAMUSCULAR | 0 refills | Status: DC
  Filled 2020-06-20: qty 0.3, 1d supply, fill #0

## 2020-06-22 DIAGNOSIS — R262 Difficulty in walking, not elsewhere classified: Secondary | ICD-10-CM | POA: Diagnosis not present

## 2020-06-22 DIAGNOSIS — S76012D Strain of muscle, fascia and tendon of left hip, subsequent encounter: Secondary | ICD-10-CM | POA: Diagnosis not present

## 2020-06-22 DIAGNOSIS — M6281 Muscle weakness (generalized): Secondary | ICD-10-CM | POA: Diagnosis not present

## 2020-06-22 DIAGNOSIS — M25552 Pain in left hip: Secondary | ICD-10-CM | POA: Diagnosis not present

## 2020-06-23 ENCOUNTER — Other Ambulatory Visit (HOSPITAL_BASED_OUTPATIENT_CLINIC_OR_DEPARTMENT_OTHER): Payer: Self-pay

## 2020-06-26 DIAGNOSIS — M25552 Pain in left hip: Secondary | ICD-10-CM | POA: Diagnosis not present

## 2020-06-26 DIAGNOSIS — R262 Difficulty in walking, not elsewhere classified: Secondary | ICD-10-CM | POA: Diagnosis not present

## 2020-06-26 DIAGNOSIS — M6281 Muscle weakness (generalized): Secondary | ICD-10-CM | POA: Diagnosis not present

## 2020-06-26 DIAGNOSIS — S76012D Strain of muscle, fascia and tendon of left hip, subsequent encounter: Secondary | ICD-10-CM | POA: Diagnosis not present

## 2020-06-27 ENCOUNTER — Other Ambulatory Visit (HOSPITAL_BASED_OUTPATIENT_CLINIC_OR_DEPARTMENT_OTHER): Payer: Self-pay

## 2020-06-29 DIAGNOSIS — M25552 Pain in left hip: Secondary | ICD-10-CM | POA: Diagnosis not present

## 2020-06-29 DIAGNOSIS — M6281 Muscle weakness (generalized): Secondary | ICD-10-CM | POA: Diagnosis not present

## 2020-06-29 DIAGNOSIS — S76012D Strain of muscle, fascia and tendon of left hip, subsequent encounter: Secondary | ICD-10-CM | POA: Diagnosis not present

## 2020-06-29 DIAGNOSIS — R262 Difficulty in walking, not elsewhere classified: Secondary | ICD-10-CM | POA: Diagnosis not present

## 2020-07-03 DIAGNOSIS — M6281 Muscle weakness (generalized): Secondary | ICD-10-CM | POA: Diagnosis not present

## 2020-07-03 DIAGNOSIS — S76012D Strain of muscle, fascia and tendon of left hip, subsequent encounter: Secondary | ICD-10-CM | POA: Diagnosis not present

## 2020-07-03 DIAGNOSIS — R262 Difficulty in walking, not elsewhere classified: Secondary | ICD-10-CM | POA: Diagnosis not present

## 2020-07-03 DIAGNOSIS — M25552 Pain in left hip: Secondary | ICD-10-CM | POA: Diagnosis not present

## 2020-07-04 NOTE — Progress Notes (Signed)
   I, Peterson Lombard, LAT, ATC acting as a scribe for Lynne Leader, MD.  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 Pt was last seen by Dr. Georgina Snell on 06/05/20 and was taught HEP and referred to [facility]. Additionally, pt was prescribed Celebrex and tizanidine to take at bedtime. Today, pt reports tightness in her hamstrings, improvement in pain overall. Pt has completed 3 weeks of PT. Pt c/o pain w/ sitting and driving.   Pertinent review of systems: No fevers or chills  Relevant historical information: Bariatric surgery history   Exam:  BP 128/84 (BP Location: Right Arm, Patient Position: Sitting, Cuff Size: Normal)   Pulse 73   Ht 5' 4.5" (1.638 m)   Wt 241 lb (109.3 kg)   SpO2 98%   BMI 40.73 kg/m  General: Well Developed, well nourished, and in no acute distress.   MSK: Left hamstring tender palpation origin posterior thigh near ischial tuberosity. Normal hip motion. Intact strength. Some pain with resisted hip extension.  Minimal pain with resisted knee flexion. Positive H test.    Assessment and Plan: 55 y.o. female with left hamstring strain.  Ongoing for about a month.  Patient has had a few sessions with physical therapy now and is improving.  She is not better completely but is improved.  Plan to continue PT and home exercise program.  Advance activity as tolerated.  Discussed potential for nitroglycerin patch protocol.  She does have a headache history and after discussion would like to avoid it.  Recheck with me in 6 weeks if not improved significantly.    Discussed warning signs or symptoms. Please see discharge instructions. Patient expresses understanding.   The above documentation has been reviewed and is accurate and complete Lynne Leader, M.D.

## 2020-07-05 ENCOUNTER — Telehealth: Payer: Self-pay | Admitting: Dermatology

## 2020-07-05 ENCOUNTER — Ambulatory Visit (INDEPENDENT_AMBULATORY_CARE_PROVIDER_SITE_OTHER): Payer: 59 | Admitting: Dermatology

## 2020-07-05 ENCOUNTER — Encounter: Payer: Self-pay | Admitting: Dermatology

## 2020-07-05 ENCOUNTER — Other Ambulatory Visit: Payer: Self-pay

## 2020-07-05 ENCOUNTER — Ambulatory Visit: Payer: 59 | Admitting: Family Medicine

## 2020-07-05 VITALS — BP 128/84 | HR 73 | Ht 64.5 in | Wt 241.0 lb

## 2020-07-05 DIAGNOSIS — S76312D Strain of muscle, fascia and tendon of the posterior muscle group at thigh level, left thigh, subsequent encounter: Secondary | ICD-10-CM

## 2020-07-05 DIAGNOSIS — L710 Perioral dermatitis: Secondary | ICD-10-CM

## 2020-07-05 NOTE — Progress Notes (Signed)
This encounter was created in error - please disregard.

## 2020-07-05 NOTE — Telephone Encounter (Signed)
Spoke with patient. She reports doing well with no breakouts. Recommend decreasing doxycycline to once per day for a week and then stopping if she has no breakouts. Call if this recurs. She also notes large pores at nose and sensitive skin. Prescribed Will prescribe Skin Medicinals Anti-Aging Tretinoin 0.0125%/Niacinamide/Vitamin C/Vitamin E/Turmeric/Resveratrol with Hyaluronic Acid. Apply pea sized amount nightly to the entire face.  The patient was advised this is not covered by insurance since it is made by a compounding pharmacy. They will receive an email to check out and the medication will be mailed to their home.   Topical retinoid medications like tretinoin can cause dryness and irritation when first started. Only apply a pea-sized amount to the entire affected area. Avoid applying it around the eyes, edges of mouth and creases at the nose. If you experience irritation, use a good moisturizer first and/or apply the medicine less often. If you are doing well with the medicine, you can increase how often you use it until you are applying every night. Be careful with sun protection while using this medication as it can make you sensitive to the sun. This medicine should not be used by pregnant women.

## 2020-07-05 NOTE — Patient Instructions (Signed)
Thank you for coming in today.   Keep working with PT and home exercises.   The Ask[place] L Protocol for Hamstring Strains  Lyman Bishop PT  Recheck in 6 weeks if not better enough.   Let me know if you have problems.   Could consider nitro patches. People do ok on them mostly.   Could do MRI in the future.

## 2020-07-06 DIAGNOSIS — S76012D Strain of muscle, fascia and tendon of left hip, subsequent encounter: Secondary | ICD-10-CM | POA: Diagnosis not present

## 2020-07-06 DIAGNOSIS — M25552 Pain in left hip: Secondary | ICD-10-CM | POA: Diagnosis not present

## 2020-07-06 DIAGNOSIS — M6281 Muscle weakness (generalized): Secondary | ICD-10-CM | POA: Diagnosis not present

## 2020-07-06 DIAGNOSIS — R262 Difficulty in walking, not elsewhere classified: Secondary | ICD-10-CM | POA: Diagnosis not present

## 2020-07-12 ENCOUNTER — Other Ambulatory Visit: Payer: Self-pay

## 2020-07-12 ENCOUNTER — Ambulatory Visit
Admission: RE | Admit: 2020-07-12 | Discharge: 2020-07-12 | Disposition: A | Discharge status: Home / Self Care | Payer: 59 | Source: Ambulatory Visit

## 2020-07-12 DIAGNOSIS — Z1231 Encounter for screening mammogram for malignant neoplasm of breast: Secondary | ICD-10-CM

## 2020-07-13 DIAGNOSIS — S76012D Strain of muscle, fascia and tendon of left hip, subsequent encounter: Secondary | ICD-10-CM | POA: Diagnosis not present

## 2020-07-13 DIAGNOSIS — R262 Difficulty in walking, not elsewhere classified: Secondary | ICD-10-CM | POA: Diagnosis not present

## 2020-07-13 DIAGNOSIS — M6281 Muscle weakness (generalized): Secondary | ICD-10-CM | POA: Diagnosis not present

## 2020-07-13 DIAGNOSIS — M25552 Pain in left hip: Secondary | ICD-10-CM | POA: Diagnosis not present

## 2020-07-18 ENCOUNTER — Other Ambulatory Visit (HOSPITAL_BASED_OUTPATIENT_CLINIC_OR_DEPARTMENT_OTHER): Payer: Self-pay

## 2020-07-31 DIAGNOSIS — M25552 Pain in left hip: Secondary | ICD-10-CM | POA: Diagnosis not present

## 2020-07-31 DIAGNOSIS — M6281 Muscle weakness (generalized): Secondary | ICD-10-CM | POA: Diagnosis not present

## 2020-07-31 DIAGNOSIS — R262 Difficulty in walking, not elsewhere classified: Secondary | ICD-10-CM | POA: Diagnosis not present

## 2020-07-31 DIAGNOSIS — S76012D Strain of muscle, fascia and tendon of left hip, subsequent encounter: Secondary | ICD-10-CM | POA: Diagnosis not present

## 2020-08-03 DIAGNOSIS — R262 Difficulty in walking, not elsewhere classified: Secondary | ICD-10-CM | POA: Diagnosis not present

## 2020-08-03 DIAGNOSIS — S76012D Strain of muscle, fascia and tendon of left hip, subsequent encounter: Secondary | ICD-10-CM | POA: Diagnosis not present

## 2020-08-03 DIAGNOSIS — M25552 Pain in left hip: Secondary | ICD-10-CM | POA: Diagnosis not present

## 2020-08-03 DIAGNOSIS — M6281 Muscle weakness (generalized): Secondary | ICD-10-CM | POA: Diagnosis not present

## 2020-08-10 DIAGNOSIS — M6281 Muscle weakness (generalized): Secondary | ICD-10-CM | POA: Diagnosis not present

## 2020-08-10 DIAGNOSIS — S76012D Strain of muscle, fascia and tendon of left hip, subsequent encounter: Secondary | ICD-10-CM | POA: Diagnosis not present

## 2020-08-10 DIAGNOSIS — R262 Difficulty in walking, not elsewhere classified: Secondary | ICD-10-CM | POA: Diagnosis not present

## 2020-08-10 DIAGNOSIS — M25552 Pain in left hip: Secondary | ICD-10-CM | POA: Diagnosis not present

## 2020-08-11 ENCOUNTER — Other Ambulatory Visit (HOSPITAL_BASED_OUTPATIENT_CLINIC_OR_DEPARTMENT_OTHER): Payer: Self-pay

## 2020-08-11 MED FILL — Methocarbamol Tab 500 MG: ORAL | 20 days supply | Qty: 60 | Fill #1 | Status: AC

## 2020-08-14 DIAGNOSIS — M25552 Pain in left hip: Secondary | ICD-10-CM | POA: Diagnosis not present

## 2020-08-14 DIAGNOSIS — R262 Difficulty in walking, not elsewhere classified: Secondary | ICD-10-CM | POA: Diagnosis not present

## 2020-08-14 DIAGNOSIS — M6281 Muscle weakness (generalized): Secondary | ICD-10-CM | POA: Diagnosis not present

## 2020-08-14 DIAGNOSIS — S76012D Strain of muscle, fascia and tendon of left hip, subsequent encounter: Secondary | ICD-10-CM | POA: Diagnosis not present

## 2020-08-16 ENCOUNTER — Other Ambulatory Visit (HOSPITAL_BASED_OUTPATIENT_CLINIC_OR_DEPARTMENT_OTHER): Payer: Self-pay

## 2020-08-16 DIAGNOSIS — Z6841 Body Mass Index (BMI) 40.0 and over, adult: Secondary | ICD-10-CM | POA: Diagnosis not present

## 2020-08-16 DIAGNOSIS — Z01419 Encounter for gynecological examination (general) (routine) without abnormal findings: Secondary | ICD-10-CM | POA: Diagnosis not present

## 2020-08-16 MED ORDER — VENLAFAXINE HCL ER 37.5 MG PO CP24
ORAL_CAPSULE | ORAL | 3 refills | Status: DC
  Filled 2020-08-16: qty 90, 90d supply, fill #0

## 2020-08-16 MED ORDER — NORETHINDRONE 0.35 MG PO TABS
1.0000 | ORAL_TABLET | Freq: Every day | ORAL | 3 refills | Status: DC
  Filled 2020-08-16: qty 84, 84d supply, fill #0
  Filled 2020-11-23: qty 84, 84d supply, fill #1

## 2020-08-17 ENCOUNTER — Other Ambulatory Visit (HOSPITAL_BASED_OUTPATIENT_CLINIC_OR_DEPARTMENT_OTHER): Payer: Self-pay

## 2020-08-21 ENCOUNTER — Other Ambulatory Visit (HOSPITAL_BASED_OUTPATIENT_CLINIC_OR_DEPARTMENT_OTHER): Payer: Self-pay

## 2020-08-21 DIAGNOSIS — S76012D Strain of muscle, fascia and tendon of left hip, subsequent encounter: Secondary | ICD-10-CM | POA: Diagnosis not present

## 2020-08-21 DIAGNOSIS — M6281 Muscle weakness (generalized): Secondary | ICD-10-CM | POA: Diagnosis not present

## 2020-08-21 DIAGNOSIS — R262 Difficulty in walking, not elsewhere classified: Secondary | ICD-10-CM | POA: Diagnosis not present

## 2020-08-21 DIAGNOSIS — M25552 Pain in left hip: Secondary | ICD-10-CM | POA: Diagnosis not present

## 2020-08-24 DIAGNOSIS — S76012D Strain of muscle, fascia and tendon of left hip, subsequent encounter: Secondary | ICD-10-CM | POA: Diagnosis not present

## 2020-08-24 DIAGNOSIS — M25552 Pain in left hip: Secondary | ICD-10-CM | POA: Diagnosis not present

## 2020-08-24 DIAGNOSIS — R262 Difficulty in walking, not elsewhere classified: Secondary | ICD-10-CM | POA: Diagnosis not present

## 2020-08-24 DIAGNOSIS — M6281 Muscle weakness (generalized): Secondary | ICD-10-CM | POA: Diagnosis not present

## 2020-09-08 ENCOUNTER — Other Ambulatory Visit (HOSPITAL_BASED_OUTPATIENT_CLINIC_OR_DEPARTMENT_OTHER): Payer: Self-pay

## 2020-09-15 ENCOUNTER — Other Ambulatory Visit (HOSPITAL_BASED_OUTPATIENT_CLINIC_OR_DEPARTMENT_OTHER): Payer: Self-pay

## 2020-09-15 MED ORDER — CARESTART COVID-19 HOME TEST VI KIT
PACK | 0 refills | Status: DC
  Filled 2020-09-15: qty 4, 4d supply, fill #0

## 2020-09-20 ENCOUNTER — Other Ambulatory Visit (HOSPITAL_BASED_OUTPATIENT_CLINIC_OR_DEPARTMENT_OTHER): Payer: Self-pay

## 2020-09-20 DIAGNOSIS — F4323 Adjustment disorder with mixed anxiety and depressed mood: Secondary | ICD-10-CM | POA: Diagnosis not present

## 2020-09-20 MED ORDER — VENLAFAXINE HCL ER 75 MG PO CP24
ORAL_CAPSULE | ORAL | 3 refills | Status: DC
  Filled 2020-09-20: qty 90, 90d supply, fill #0
  Filled 2021-01-11: qty 90, 90d supply, fill #1

## 2020-09-25 ENCOUNTER — Other Ambulatory Visit (INDEPENDENT_AMBULATORY_CARE_PROVIDER_SITE_OTHER): Payer: 59

## 2020-09-25 DIAGNOSIS — E559 Vitamin D deficiency, unspecified: Secondary | ICD-10-CM | POA: Diagnosis not present

## 2020-09-25 DIAGNOSIS — Z9884 Bariatric surgery status: Secondary | ICD-10-CM | POA: Diagnosis not present

## 2020-09-25 DIAGNOSIS — E7800 Pure hypercholesterolemia, unspecified: Secondary | ICD-10-CM | POA: Diagnosis not present

## 2020-09-25 DIAGNOSIS — E538 Deficiency of other specified B group vitamins: Secondary | ICD-10-CM | POA: Diagnosis not present

## 2020-09-25 LAB — LIPID PANEL
Cholesterol: 260 mg/dL — ABNORMAL HIGH (ref 0–200)
HDL: 74.6 mg/dL (ref >39.00)
LDL Cholesterol: 158 mg/dL — ABNORMAL HIGH (ref 0–99)
NonHDL: 185.24
Total CHOL/HDL Ratio: 3
Triglycerides: 136 mg/dL (ref 0.0–149.0)
VLDL: 27.2 mg/dL (ref 0.0–40.0)

## 2020-09-25 LAB — VITAMIN D 25 HYDROXY (VIT D DEFICIENCY, FRACTURES): VITD: 55.87 ng/mL (ref 30.00–100.00)

## 2020-09-25 LAB — VITAMIN B12: Vitamin B-12: 324 pg/mL (ref 211–911)

## 2020-09-25 LAB — IRON: Iron: 149 ug/dL — ABNORMAL HIGH (ref 42–145)

## 2020-09-26 ENCOUNTER — Encounter: Payer: Self-pay | Admitting: Family Medicine

## 2020-10-03 ENCOUNTER — Ambulatory Visit: Payer: 59 | Attending: Internal Medicine

## 2020-10-03 DIAGNOSIS — Z23 Encounter for immunization: Secondary | ICD-10-CM

## 2020-10-03 NOTE — Progress Notes (Signed)
   Covid-19 Vaccination Clinic  Name:  Madeline CROKE, MD    MRN: [id number] DOB: Jan 16, 1965  10/03/2020  [name] was observed post Covid-19 immunization for 15 minutes without incident. She was provided with Vaccine Information Sheet and instruction to access the V-Safe system.   [name] was instructed to call 911 with any severe reactions post vaccine: Difficulty breathing  Swelling of face and throat  A fast heartbeat  A bad rash all over body  Dizziness and weakness

## 2020-10-10 ENCOUNTER — Other Ambulatory Visit (HOSPITAL_BASED_OUTPATIENT_CLINIC_OR_DEPARTMENT_OTHER): Payer: Self-pay

## 2020-10-10 MED ORDER — COVID-19MRNA BIVAL VACC PFIZER 30 MCG/0.3ML IM SUSP
INTRAMUSCULAR | 0 refills | Status: DC
  Filled 2020-10-10: qty 0.3, 1d supply, fill #0

## 2020-10-11 DIAGNOSIS — F4323 Adjustment disorder with mixed anxiety and depressed mood: Secondary | ICD-10-CM | POA: Diagnosis not present

## 2020-10-16 ENCOUNTER — Other Ambulatory Visit (HOSPITAL_BASED_OUTPATIENT_CLINIC_OR_DEPARTMENT_OTHER): Payer: Self-pay

## 2020-10-16 ENCOUNTER — Other Ambulatory Visit: Payer: Self-pay | Admitting: Family Medicine

## 2020-10-16 MED ORDER — HYDROCHLOROTHIAZIDE 25 MG PO TABS
ORAL_TABLET | ORAL | 5 refills | Status: DC
  Filled 2020-10-16: qty 90, 90d supply, fill #0
  Filled 2021-05-08: qty 90, 90d supply, fill #1

## 2020-10-18 DIAGNOSIS — F4323 Adjustment disorder with mixed anxiety and depressed mood: Secondary | ICD-10-CM | POA: Diagnosis not present

## 2020-11-08 DIAGNOSIS — F4323 Adjustment disorder with mixed anxiety and depressed mood: Secondary | ICD-10-CM | POA: Diagnosis not present

## 2020-11-23 ENCOUNTER — Other Ambulatory Visit (HOSPITAL_BASED_OUTPATIENT_CLINIC_OR_DEPARTMENT_OTHER): Payer: Self-pay

## 2020-11-27 ENCOUNTER — Other Ambulatory Visit (HOSPITAL_BASED_OUTPATIENT_CLINIC_OR_DEPARTMENT_OTHER): Payer: Self-pay

## 2020-12-13 DIAGNOSIS — F4323 Adjustment disorder with mixed anxiety and depressed mood: Secondary | ICD-10-CM | POA: Diagnosis not present

## 2021-01-03 DIAGNOSIS — F4323 Adjustment disorder with mixed anxiety and depressed mood: Secondary | ICD-10-CM | POA: Diagnosis not present

## 2021-01-11 ENCOUNTER — Other Ambulatory Visit (HOSPITAL_BASED_OUTPATIENT_CLINIC_OR_DEPARTMENT_OTHER): Payer: Self-pay

## 2021-01-17 DIAGNOSIS — Z135 Encounter for screening for eye and ear disorders: Secondary | ICD-10-CM | POA: Diagnosis not present

## 2021-01-17 DIAGNOSIS — H524 Presbyopia: Secondary | ICD-10-CM | POA: Diagnosis not present

## 2021-01-17 DIAGNOSIS — H52223 Regular astigmatism, bilateral: Secondary | ICD-10-CM | POA: Diagnosis not present

## 2021-01-17 DIAGNOSIS — H5203 Hypermetropia, bilateral: Secondary | ICD-10-CM | POA: Diagnosis not present

## 2021-01-30 ENCOUNTER — Other Ambulatory Visit (HOSPITAL_BASED_OUTPATIENT_CLINIC_OR_DEPARTMENT_OTHER): Payer: Self-pay

## 2021-01-30 MED ORDER — PROGESTERONE 200 MG PO CAPS
ORAL_CAPSULE | ORAL | 9 refills | Status: DC
  Filled 2021-01-30: qty 30, 30d supply, fill #0
  Filled 2021-02-26: qty 30, 30d supply, fill #1
  Filled 2021-04-01: qty 30, 30d supply, fill #2
  Filled 2021-05-17: qty 30, 30d supply, fill #3
  Filled 2021-06-24: qty 30, 30d supply, fill #4

## 2021-01-30 MED ORDER — VENLAFAXINE HCL ER 150 MG PO CP24
ORAL_CAPSULE | ORAL | 9 refills | Status: DC
  Filled 2021-01-30: qty 30, 30d supply, fill #0
  Filled 2021-04-01: qty 30, 30d supply, fill #1
  Filled 2021-05-02: qty 30, 30d supply, fill #2
  Filled 2021-06-03: qty 30, 30d supply, fill #3
  Filled 2021-06-28: qty 30, 30d supply, fill #4
  Filled 2021-07-31: qty 30, 30d supply, fill #5
  Filled 2021-09-05: qty 30, 30d supply, fill #6
  Filled 2021-09-23 – 2021-09-28 (×3): qty 30, 30d supply, fill #7
  Filled 2021-11-02: qty 30, 30d supply, fill #8
  Filled 2021-12-03: qty 30, 30d supply, fill #9

## 2021-01-31 ENCOUNTER — Other Ambulatory Visit (HOSPITAL_BASED_OUTPATIENT_CLINIC_OR_DEPARTMENT_OTHER): Payer: Self-pay

## 2021-01-31 ENCOUNTER — Other Ambulatory Visit: Payer: Self-pay | Admitting: Family Medicine

## 2021-01-31 MED ORDER — METHOCARBAMOL 500 MG PO TABS
ORAL_TABLET | Freq: Three times a day (TID) | ORAL | 5 refills | Status: AC | PRN
  Filled 2021-01-31: qty 60, 20d supply, fill #0
  Filled 2021-04-23: qty 60, 20d supply, fill #1
  Filled 2021-07-20: qty 60, 20d supply, fill #2
  Filled 2021-09-20: qty 60, 20d supply, fill #3

## 2021-01-31 NOTE — Telephone Encounter (Signed)
Refill request Robaxin Last refill 02/01/20 #60/2 Last office visit 06/14/20

## 2021-02-07 DIAGNOSIS — F4323 Adjustment disorder with mixed anxiety and depressed mood: Secondary | ICD-10-CM | POA: Diagnosis not present

## 2021-02-08 ENCOUNTER — Other Ambulatory Visit: Payer: Self-pay | Admitting: Internal Medicine

## 2021-02-08 ENCOUNTER — Other Ambulatory Visit (HOSPITAL_BASED_OUTPATIENT_CLINIC_OR_DEPARTMENT_OTHER): Payer: Self-pay

## 2021-02-08 MED ORDER — TOBRAMYCIN 0.3 % OP SOLN
2.0000 [drp] | OPHTHALMIC | 0 refills | Status: DC
  Filled 2021-02-08: qty 5, 9d supply, fill #0

## 2021-02-14 ENCOUNTER — Encounter: Payer: Self-pay | Admitting: Dermatology

## 2021-02-27 ENCOUNTER — Other Ambulatory Visit (HOSPITAL_BASED_OUTPATIENT_CLINIC_OR_DEPARTMENT_OTHER): Payer: Self-pay

## 2021-04-02 ENCOUNTER — Other Ambulatory Visit (HOSPITAL_BASED_OUTPATIENT_CLINIC_OR_DEPARTMENT_OTHER): Payer: Self-pay

## 2021-04-04 DIAGNOSIS — F4323 Adjustment disorder with mixed anxiety and depressed mood: Secondary | ICD-10-CM | POA: Diagnosis not present

## 2021-04-23 ENCOUNTER — Other Ambulatory Visit (HOSPITAL_BASED_OUTPATIENT_CLINIC_OR_DEPARTMENT_OTHER): Payer: Self-pay

## 2021-05-03 ENCOUNTER — Other Ambulatory Visit (HOSPITAL_BASED_OUTPATIENT_CLINIC_OR_DEPARTMENT_OTHER): Payer: Self-pay

## 2021-05-08 ENCOUNTER — Other Ambulatory Visit (HOSPITAL_BASED_OUTPATIENT_CLINIC_OR_DEPARTMENT_OTHER): Payer: Self-pay

## 2021-05-09 DIAGNOSIS — F4323 Adjustment disorder with mixed anxiety and depressed mood: Secondary | ICD-10-CM | POA: Diagnosis not present

## 2021-05-15 ENCOUNTER — Other Ambulatory Visit: Payer: Self-pay | Admitting: Internal Medicine

## 2021-05-15 ENCOUNTER — Other Ambulatory Visit (HOSPITAL_BASED_OUTPATIENT_CLINIC_OR_DEPARTMENT_OTHER): Payer: Self-pay

## 2021-05-15 MED ORDER — FLUTICASONE PROPIONATE HFA 110 MCG/ACT IN AERO
1.0000 | INHALATION_SPRAY | Freq: Two times a day (BID) | RESPIRATORY_TRACT | 5 refills | Status: DC
  Filled 2021-05-15: qty 12, 60d supply, fill #0
  Filled 2021-09-05: qty 12, 60d supply, fill #1

## 2021-05-15 MED ORDER — QVAR REDIHALER 80 MCG/ACT IN AERB
2.0000 | INHALATION_SPRAY | Freq: Every day | RESPIRATORY_TRACT | 3 refills | Status: DC
  Filled 2021-05-15: qty 10.6, 60d supply, fill #0

## 2021-05-15 NOTE — Progress Notes (Signed)
Complaining of mild wheezing lately.  No fever chills, no URI type of symptoms.  Uses albuterol as needed. ?On exam no wheezing, few rhonchi with cough. ?We will RF Qvar which helped very well in the past ?JP ?

## 2021-05-17 ENCOUNTER — Other Ambulatory Visit (HOSPITAL_BASED_OUTPATIENT_CLINIC_OR_DEPARTMENT_OTHER): Payer: Self-pay

## 2021-05-30 ENCOUNTER — Encounter: Payer: Self-pay | Admitting: Family Medicine

## 2021-05-30 ENCOUNTER — Ambulatory Visit (INDEPENDENT_AMBULATORY_CARE_PROVIDER_SITE_OTHER): Payer: 59 | Admitting: Family Medicine

## 2021-05-30 ENCOUNTER — Other Ambulatory Visit (HOSPITAL_BASED_OUTPATIENT_CLINIC_OR_DEPARTMENT_OTHER): Payer: Self-pay

## 2021-05-30 VITALS — BP 110/70 | HR 76 | Temp 97.6°F | Ht 64.0 in | Wt 253.0 lb

## 2021-05-30 DIAGNOSIS — N951 Menopausal and female climacteric states: Secondary | ICD-10-CM | POA: Diagnosis not present

## 2021-05-30 DIAGNOSIS — E2839 Other primary ovarian failure: Secondary | ICD-10-CM | POA: Insufficient documentation

## 2021-05-30 DIAGNOSIS — E538 Deficiency of other specified B group vitamins: Secondary | ICD-10-CM

## 2021-05-30 DIAGNOSIS — Z6841 Body Mass Index (BMI) 40.0 and over, adult: Secondary | ICD-10-CM | POA: Diagnosis not present

## 2021-05-30 DIAGNOSIS — Z0000 Encounter for general adult medical examination without abnormal findings: Secondary | ICD-10-CM | POA: Diagnosis not present

## 2021-05-30 DIAGNOSIS — Z1211 Encounter for screening for malignant neoplasm of colon: Secondary | ICD-10-CM | POA: Diagnosis not present

## 2021-05-30 DIAGNOSIS — Z9884 Bariatric surgery status: Secondary | ICD-10-CM | POA: Diagnosis not present

## 2021-05-30 DIAGNOSIS — R197 Diarrhea, unspecified: Secondary | ICD-10-CM

## 2021-05-30 DIAGNOSIS — E7800 Pure hypercholesterolemia, unspecified: Secondary | ICD-10-CM

## 2021-05-30 DIAGNOSIS — E559 Vitamin D deficiency, unspecified: Secondary | ICD-10-CM

## 2021-05-30 MED ORDER — SEMAGLUTIDE-WEIGHT MANAGEMENT 0.25 MG/0.5ML ~~LOC~~ SOAJ
0.2500 mg | SUBCUTANEOUS | 3 refills | Status: DC
  Filled 2021-05-30 – 2021-06-04 (×2): qty 2, 28d supply, fill #0
  Filled 2021-06-24: qty 2, 28d supply, fill #1

## 2021-05-30 MED ORDER — GABAPENTIN 100 MG PO CAPS
100.0000 mg | ORAL_CAPSULE | Freq: Every day | ORAL | 3 refills | Status: DC
  Filled 2021-05-30: qty 180, 90d supply, fill #0
  Filled 2021-09-05: qty 180, 90d supply, fill #1

## 2021-05-30 NOTE — Assessment & Plan Note (Signed)
effexor has helped ADD/mood but still strugging with hot flashes and trouble sleeping ?Takes bio identical hormones  ? ?Will try gabapentin again at bedtime 200 mg to help with sleep and night sweats and update  ?

## 2021-05-30 NOTE — Assessment & Plan Note (Signed)
Reviewed health habits including diet and exercise and skin cancer prevention ?Reviewed appropriate screening tests for age  ?Also reviewed health mt list, fam hx and immunization status , as well as social and family history   ?See HPI ?Labs reviewed  ?Pap/gyn exam planned for next mo with gyn ?Mammogram planned for July ?colonoscoy utd 2020 ?Taking supplements for bone health /D level is tx  ?imms reviewed  ?

## 2021-05-30 NOTE — Assessment & Plan Note (Signed)
Discussed how this problem influences overall health and the risks it imposes  ?Reviewed plan for weight loss with lower calorie diet (via better food choices and also portion control or program like weight watchers) and exercise building up to or more than 30 minutes 5 days per week including some aerobic activity  ? ? ?Interested in semaglutide Chief of Staff) for wt loss ?Disc option of GLP medication including possible side effects like GI intolerance and risk of thyroid and endocrine cancer, pancreatitis and gallstones, kidney problems and diabetic retinopathy ?Will start 0.25 mg weekly and if doing well at a month update and inc to 0.5  ?

## 2021-05-30 NOTE — Assessment & Plan Note (Signed)
Lab Results  ?Component Value Date  ? [id number] 324 09/25/2020  ? ?Enc pt to continue oral supplement daily (in setting of past bariatric surgery) ?

## 2021-05-30 NOTE — Assessment & Plan Note (Signed)
D level 55.8  ?Recommend 2000 iu D3 daily for bone and overall health ?

## 2021-05-30 NOTE — Assessment & Plan Note (Signed)
Reviewed recent labs  ?B12 and D in nl range  ? ?No oral iron in light of last elevated level ? ?

## 2021-05-30 NOTE — Assessment & Plan Note (Signed)
Disc goals for lipids and reasons to control them ?Rev last labs with pt ?Rev low sat fat diet in detail ?LDL is down from a year ago but up from the fall  ? ?Pt wants to keep working on diet  ?High HDL helps ratio stay at 3 ?

## 2021-05-30 NOTE — Patient Instructions (Signed)
Let me know how the semaglutide works out  ?We can go up on the dose in a month  ? ? ?Take care of yourself ! ? ? ?Try the gabapentin for sleep and hot flashes  ? ? ? ?

## 2021-05-30 NOTE — Assessment & Plan Note (Signed)
utd colonoscopy 2020 ?

## 2021-05-30 NOTE — Progress Notes (Signed)
? ?Subjective:  ? ? Patient ID: Madeline Held, MD, female    DOB: 1965-08-19, 56 y.o.   MRN: [id number] ? ?HPI ?Here for health maintenance exam and to review chronic medical problems   ? ?Wt Readings from Last 3 Encounters:  ?05/30/21 253 lb (114.8 kg)  ?07/05/20 241 lb (109.3 kg)  ?06/14/20 238 lb 5 oz (108.1 kg)  ? ?43.43 kg/m? ? ?Stressed out  ?Not sleeping well  ?Hard    ? ?Immunization History  ?Administered Date(s) Administered  ? Hepatitis A 09/29/2002  ? Influenza Whole 10/03/2011  ? Influenza, High Dose Seasonal PF 08/23/2015  ? Influenza,inj,quad, With Preservative 10/15/2018  ? Influenza-Unspecified 09/29/2014, 10/17/2016, 10/12/2018  ? PFIZER Comirnaty(Gray Top)Covid-19 Tri-Sucrose Vaccine 06/16/2020  ? PFIZER(Purple Top)SARS-COV-2 Vaccination 01/06/2019, 01/27/2019, 09/11/2019  ? Pension scheme manager 65yr & up 10/03/2020  ? Td 01/14/1994  ? Tdap 08/07/2016  ? Zoster Recombinat (Shingrix) 05/13/2016, 07/09/2016  ? ?Had flu shot in fall/work ? ?Gyn care- Dr GHelane Rima ?Ablation in the past  ? ?Hot flashes are bad  ?Gabapentin -? If helpful  ?Hot flashes wakes her up and then racing brain  ?Effexor xr  ? ?Interested in wMonticellofor wt loss  ? ?No regular exercise  ?Tries to fit in execise ? ?Mammogram  06/2020-scheduling in July  ?Self breast exam  : no change  ? ?Hot flashes :  ?Bio identical hormones now- ? If helping yet  ? ? ?Colonoscopy 07/2018 ? ?Ca and D  ?Other supplements  ? ?BP Readings from Last 3 Encounters:  ?05/30/21 110/70  ?07/05/20 128/84  ?06/14/20 112/68  ? ?Pulse Readings from Last 3 Encounters:  ?05/30/21 76  ?07/05/20 73  ?06/14/20 100  ? ?Had [facility] labs ?MCV 100 ?Glucose 85 ?Cr 0.71 ? ?Tot chol 285 ?Trig 142 ?HDL 95 ?LDL 165   (184 in past) ? ?Wants to eat better  ? ? ?Lab Results  ?Component Value Date  ? CHOL 260 (H) 09/25/2020  ? HDL 74.60 09/25/2020  ? LDLCALC 158 (H) 09/25/2020  ? LDLDIRECT 164.6 04/09/2012  ? TRIG 136.0 09/25/2020  ? CHOLHDL 3 09/25/2020   ? ? ?A1c 5.3  ? ?B12 def- hit or miss for dosing  ? ?Lab Results  ?Component Value Date  ? [id number] 09/25/2020  ? Vit D 55.8 in sept  ? ?Lab Results  ?Component Value Date  ? IRON 149 (H) 09/25/2020  ? ? ?No iron right now  ? ?Patient Active Problem List  ? Diagnosis Date Noted  ? Bariatric surgery status 06/14/2020  ? B12 deficiency 06/14/2020  ? Vitamin D deficiency 06/14/2020  ? Routine general medical examination at a health care facility 06/14/2020  ? Vasomotor symptoms due to menopause 06/14/2020  ? History of COVID-19 05/22/2020  ? BMI 40.0-44.9, adult ([place] 06/02/2019  ? Colon cancer screening 06/11/2018  ? Knee pain, bilateral 02/24/2018  ? Low back pain 12/27/2015  ? Lumbar degenerative disc disease 12/27/2015  ? Plantar fasciitis, left 06/08/2014  ? S/P myomectomy 10/18/2013  ? Acute superficial venous thrombosis of lower extremity 06/03/2012  ? Lupus anticoagulant positive 06/03/2012  ? Hyperlipidemia 11/04/2006  ? ADD 11/04/2006  ? ACNE NEC 11/04/2006  ? ?Past Medical History:  ?Diagnosis Date  ? Actinic keratosis 09/01/2019  ? L lat breast - bx proven  ? Edema, lower extremity   ? takes HCTZ as needed  ? Endometrial polyp   ? Exercise-induced asthma   ? per pt has used inhaler in yrs  ?  History of anemia   ? History of thrombophlebitis   ? per pt approx. 2015 right lower leg superficial   ? PONV (postoperative nausea and vomiting)   ? Uterine fibroid   ? Wears glasses   ? ?Past Surgical History:  ?Procedure Laterality Date  ? ANTERIOR FUSION LUMBAR SPINE  05-18-2007   dr Vertell Limber / dr Amedeo Plenty  '@[facility]   ? re-do diskectomy/ dissection L5 -S1 and fusion  ? APPENDECTOMY  age 18  ? DILITATION & CURRETTAGE/HYSTROSCOPY WITH HYDROTHERMAL ABLATION N/A 10/30/2018  ? Procedure: DILATATION & CURETTAGE/HYSTEROSCOPY WITH HYDROTHERMAL ABLATION;  Surgeon: Dian Queen, MD;  Location: [facility];  Service: Gynecology;  Laterality: N/A;  ? HYSTEROSCOPY WITH D & C N/A 11/07/2017  ? Procedure: DILATATION  AND CURETTAGE /HYSTEROSCOPY;  Surgeon: Dian Queen, MD;  Location: [facility];  Service: Gynecology;  Laterality: N/A;  ? LAPAROTOMY  age 28  ? for ovarian cyst  ? LUMBAR [place] [facility]  11-07-2006;  12-26-2006  dr Vertell Limber '@[facility]   ? L5 -- S1  ? MYOMECTOMY N/A 10/18/2013  ? Procedure: ABDOMINAL MYOMECTOMY;  Surgeon: Cyril Mourning, MD;  Location: [facility];  Service: Gynecology;  Laterality: N/A;  ? REDUCTION MAMMAPLASTY Bilateral 1995  ? ROUX-EN-Y GASTRIC BYPASS  03-28-2003  dr Hassell Done  '@[facility]   ? [place]  ? TYMPANOSTOMY TUBE PLACEMENT Bilateral child  ? ?Social History  ? ?Tobacco Use  ? Smoking status: Never  ? Smokeless tobacco: Never  ?Vaping Use  ? Vaping Use: Never used  ?Substance Use Topics  ? Alcohol use: Yes  ?  Comment: occasional  ? Drug use: Never  ? ?Family History  ?Problem Relation Age of Onset  ? Hypertension Mother   ? Hyperlipidemia Father   ? COPD Father   ? Colon cancer Neg Hx   ? Colon polyps Neg Hx   ? Esophageal cancer Neg Hx   ? Rectal cancer Neg Hx   ? Stomach cancer Neg Hx   ? ?Allergies  ?Allergen Reactions  ? Levofloxacin Other (See Comments) and Nausea Only  ?  REACTION: abdiminal pain  High Dose 750 mg, ok with 500 mg dose  ? Tape Rash  ?  blister  ? ?Current Outpatient Medications on File Prior to Visit  ?Medication Sig Dispense Refill  ? albuterol (VENTOLIN HFA) 108 (90 Base) MCG/ACT inhaler Inhale 2 puffs into the lungs every 4 (four) hours as needed for wheezing. 18 g 1  ? fluticasone (FLOVENT HFA) 110 MCG/ACT inhaler Inhale 1 puff by mouth into the lungs in the morning and at bedtime. 12 g 5  ? hydrochlorothiazide (HYDRODIURIL) 25 MG tablet TAKE 1 TABLET BY MOUTH ONCE DAILY AS NEEDED EDEMA 30 tablet 5  ? ibuprofen (ADVIL) 200 MG tablet Take 200 mg by mouth every 6 (six) hours as needed.    ? levocetirizine (XYZAL) 5 MG tablet Take 5 mg by mouth daily as needed.    ? methocarbamol (ROBAXIN) 500 MG tablet TAKE 1 TABLET BY MOUTH EVERY 8 HOURS AS NEEDED FOR MUSCLE  SPASMS 60 tablet 5  ? progesterone (PROMETRIUM) 200 MG capsule Take 1 capsule by mouth once daily 30 capsule 9  ? tiZANidine (ZANAFLEX) 4 MG tablet Take 1 tablet (4 mg total) by mouth every 8 (eight) hours as needed for muscle spasms. 30 tablet 1  ? venlafaxine XR (EFFEXOR-XR) 150 MG 24 hr capsule Take 1 tablet by mouth once daily 30 capsule 9  ? ?No current facility-administered medications on file  prior to visit.  ?  ? ?Review of Systems  ?Constitutional:  Positive for fatigue and unexpected weight change. Negative for activity change, appetite change and fever.  ?HENT:  Negative for congestion, ear pain, rhinorrhea, sinus pressure and sore throat.   ?Eyes:  Negative for pain, redness and visual disturbance.  ?Respiratory:  Negative for cough, shortness of breath and wheezing.   ?Cardiovascular:  Negative for chest pain and palpitations.  ?Gastrointestinal:  Negative for abdominal pain, blood in stool, constipation and diarrhea.  ?Endocrine: Positive for heat intolerance. Negative for polydipsia and polyuria.  ?Genitourinary:  Negative for dysuria, frequency and urgency.  ?Musculoskeletal:  Negative for arthralgias, back pain and myalgias.  ?Skin:  Negative for pallor and rash.  ?Allergic/Immunologic: Negative for environmental allergies.  ?Neurological:  Negative for dizziness, syncope and headaches.  ?Hematological:  Negative for adenopathy. Does not bruise/bleed easily.  ?Psychiatric/Behavioral:  Positive for dysphoric mood and sleep disturbance. Negative for decreased concentration. The patient is not nervous/anxious.   ? ?   ?Objective:  ? Physical Exam ?Constitutional:   ?   General: She is not in acute distress. ?   Appearance: Normal appearance. She is well-developed. She is obese. She is not ill-appearing or diaphoretic.  ?HENT:  ?   Head: Normocephalic and atraumatic.  ?   Right Ear: Tympanic membrane, ear canal and external ear normal.  ?   Left Ear: Tympanic membrane, ear canal and external ear normal.   ?   Nose: Nose normal. No congestion.  ?   Mouth/Throat:  ?   Mouth: Mucous membranes are moist.  ?   Pharynx: Oropharynx is clear. No posterior oropharyngeal erythema.  ?Eyes:  ?   General: No scler

## 2021-05-30 NOTE — Assessment & Plan Note (Signed)
Menopausal  ?Ht is down as well  ?dexa ordered  ?

## 2021-05-31 ENCOUNTER — Other Ambulatory Visit (HOSPITAL_BASED_OUTPATIENT_CLINIC_OR_DEPARTMENT_OTHER): Payer: Self-pay

## 2021-06-01 ENCOUNTER — Other Ambulatory Visit (HOSPITAL_BASED_OUTPATIENT_CLINIC_OR_DEPARTMENT_OTHER): Payer: Self-pay

## 2021-06-01 ENCOUNTER — Other Ambulatory Visit: Payer: Self-pay | Admitting: Family Medicine

## 2021-06-01 ENCOUNTER — Telehealth: Payer: Self-pay

## 2021-06-01 DIAGNOSIS — Z1231 Encounter for screening mammogram for malignant neoplasm of breast: Secondary | ICD-10-CM

## 2021-06-01 NOTE — Telephone Encounter (Signed)
Prior auth started.  Waiting for determination. Kimika Streater (Key: [id number]) Rx #: [id number] XOVANV 0.'25MG'$ /0.5ML auto-injectors

## 2021-06-04 ENCOUNTER — Other Ambulatory Visit (HOSPITAL_BASED_OUTPATIENT_CLINIC_OR_DEPARTMENT_OTHER): Payer: Self-pay

## 2021-06-04 NOTE — Telephone Encounter (Signed)
Prior auth for [name] Your prior authorization for [name] has been approved! Message from plan: The request has been approved. The authorization is effective for a maximum of 7 fills from 06/02/2021 to 12/29/2021, as long as the member is enrolled in their current health plan. The request was approved as submitted. The request was approved for 689m per 28 days.Additional authorizations have been entered for the following:Wegovy 0.'5mg'$ /0.560m allowing 89m689mer 28 days for 7 fills; please reference authorization [id number];Wegovy '1mg'$ /0.5mL58mllowing 89mL 33m 28 days for 7 fills; please reference authorization [id number];Wegovy 1.'7mg'$ /0.75mL 66mwing 3mL pe45m8 days for 7 fills; please reference authorization [id number];Wegovy 2.'4mg'$ /0.75mL, a35ming 3mL per 60mdays for 7 fills; please reference authorization [id number];effective 06/02/2021 through 12/29/2021. A written notification letter will follow with additional details.

## 2021-06-06 DIAGNOSIS — F4323 Adjustment disorder with mixed anxiety and depressed mood: Secondary | ICD-10-CM | POA: Diagnosis not present

## 2021-06-08 ENCOUNTER — Other Ambulatory Visit (INDEPENDENT_AMBULATORY_CARE_PROVIDER_SITE_OTHER): Payer: 59

## 2021-06-08 DIAGNOSIS — R197 Diarrhea, unspecified: Secondary | ICD-10-CM | POA: Insufficient documentation

## 2021-06-08 LAB — CBC WITH DIFFERENTIAL/PLATELET
Basophils Absolute: 0 10*3/uL (ref 0.0–0.1)
Basophils Relative: 0.6 % (ref 0.0–3.0)
Eosinophils Absolute: 0.2 10*3/uL (ref 0.0–0.7)
Eosinophils Relative: 2.6 % (ref 0.0–5.0)
HCT: 40.2 % (ref 36.0–46.0)
Hemoglobin: 13.5 g/dL (ref 12.0–15.0)
Lymphocytes Relative: 29.6 % (ref 12.0–46.0)
Lymphs Abs: 1.8 10*3/uL (ref 0.7–4.0)
MCHC: 33.5 g/dL (ref 30.0–36.0)
MCV: 102.5 fl — ABNORMAL HIGH (ref 78.0–100.0)
Monocytes Absolute: 0.7 10*3/uL (ref 0.1–1.0)
Monocytes Relative: 11.8 % (ref 3.0–12.0)
Neutro Abs: 3.4 10*3/uL (ref 1.4–7.7)
Neutrophils Relative %: 55.4 % (ref 43.0–77.0)
Platelets: 306 10*3/uL (ref 150.0–400.0)
RBC: 3.92 Mil/uL (ref 3.87–5.11)
RDW: 12.8 % (ref 11.5–15.5)
WBC: 6.2 10*3/uL (ref 4.0–10.5)

## 2021-06-08 LAB — COMPREHENSIVE METABOLIC PANEL
ALT: 14 U/L (ref 0–35)
AST: 18 U/L (ref 0–37)
Albumin: 4.2 g/dL (ref 3.5–5.2)
Alkaline Phosphatase: 58 U/L (ref 39–117)
BUN: 15 mg/dL (ref 6–23)
CO2: 28 mEq/L (ref 19–32)
Calcium: 9.4 mg/dL (ref 8.4–10.5)
Chloride: 100 mEq/L (ref 96–112)
Creatinine, Ser: 0.64 mg/dL (ref 0.40–1.20)
GFR: 99.73 mL/min (ref >60.00)
Glucose, Bld: 87 mg/dL (ref 70–99)
Potassium: 4.5 mEq/L (ref 3.5–5.1)
Sodium: 137 mEq/L (ref 135–145)
Total Bilirubin: 0.8 mg/dL (ref 0.2–1.2)
Total Protein: 6.6 g/dL (ref 6.0–8.3)

## 2021-06-08 NOTE — Telephone Encounter (Signed)
I spoke with pt and for several days every time she eats about 1 hr after eating pt has diarrhea. Pt said it does not matter what type food pt has diarrhea; pepto has helped a little bit but not taking care of problem. Pt said no fever, no abd pain, no N&V, pt said otherwise she feels fine. Covid test negative. Pt said would be difficult for pt to come in for appt due to her working at [employer]. Pt wondering if Dr Glori Bickers would order labs at [facility]. [facility]. Pt request cb after reviewed by Dr Glori Bickers. Sending note to Dr Glori Bickers, [facility] CMA and will teams [place].

## 2021-06-11 ENCOUNTER — Telehealth: Payer: Self-pay | Admitting: Internal Medicine

## 2021-06-11 ENCOUNTER — Encounter: Payer: Self-pay | Admitting: Family Medicine

## 2021-06-11 NOTE — Telephone Encounter (Signed)
Called pt and LM 06/11/21 +shiga toxin 1 rec brat diet stay hydrated and if having nausea and unable to tolerate po rec ED to IVF and nausea medication  Rec f/u with PCP in 10-14 days to get bmet checked via voice mail   Did not mention but pt needs to be advised hand washing soap and water and sch f/u with PCP  Dr. Olivia Mackie McLean-Scocuzza

## 2021-06-12 ENCOUNTER — Telehealth: Payer: Self-pay

## 2021-06-12 ENCOUNTER — Encounter: Payer: Self-pay | Admitting: Family Medicine

## 2021-06-12 LAB — C. DIFFICILE GDH AND TOXIN A/B
GDH ANTIGEN: NOT DETECTED
MICRO NUMBER:: 13450751
SPECIMEN QUALITY:: ADEQUATE
TOXIN A AND B: NOT DETECTED

## 2021-06-12 LAB — ISOLATE REFERRAL PH
MICRO NUMBER:: 13456247
SPECIMEN QUALITY:: ADEQUATE

## 2021-06-12 LAB — GASTROINTESTINAL PATHOGEN PNL
CampyloBacter Group: NOT DETECTED
Norovirus GI/GII: NOT DETECTED
Rotavirus A: NOT DETECTED
Salmonella species: NOT DETECTED
Shiga Toxin 1: DETECTED — AB
Shiga Toxin 2: NOT DETECTED
Shigella Species: NOT DETECTED
Vibrio Group: NOT DETECTED
Yersinia enterocolitica: NOT DETECTED

## 2021-06-12 NOTE — Telephone Encounter (Signed)
Nurse Assessment Nurse: Linward Headland, RN, Ana Date/Time (Eastern Time): 06/11/2021 11:48:12 AM Is there an on-call provider listed? ---Yes Please list name of person reporting value (Lab Employee) and a contact number. ---Carlyon Shadow [phone] Please document the following items: Lab name Lab value (read back to lab to verify) Reference range for lab value Date and time blood was drawn Collect time of birth for bilirubin results ---[facility] collected [birth year] Shiga Toxin 1: detected ref. range not detected Please collect the patient contact information from the lab. (name, phone number and address) ---Kendrick Fries [phone] Disp. Time Eilene Ghazi Time) Disposition Final User 06/11/2021 11:56:38 AM Called On-Call Provider [place], RN, [facility] 06/11/2021 11:57:27 AM Clinical Call Yes Linward Headland, RN, [facility] Phone DateTime Result/ Outcome Message Type Notes McClean-Scocuzza, Olivia Mackie [phone] 06/11/2021 11:56:38 AM Called On Call Provider - Reached Doctor Paged PLEASE NOTE: All timestamps contained within this report are represented as [place] Standard Time. CONFIDENTIALTY NOTICE: This fax transmission is intended only for the addressee. It contains information that is legally privileged, confidential or otherwise protected from use or disclosure. If you are not the intended recipient, you are strictly prohibited from reviewing, disclosing, copying using or disseminating any of this information or taking any action in reliance on or regarding this information. If you have received this fax in error, please notify us immediately by telephone so that we can arrange for its return to [place]. Phone: [phone], Toll-Free: [phone], Fax: [phone] Page: 2 of 2 Call Id: [id number] Paging DoctorName Phone DateTime Result/ Outcome Message Type Notes McClean-Scocuzza, Olivia Mackie 06/11/2021 11:56:51 AM Spoke with On Call - General Message Result Reported critical result to provider

## 2021-06-25 ENCOUNTER — Encounter: Payer: Self-pay | Admitting: Family Medicine

## 2021-06-25 ENCOUNTER — Other Ambulatory Visit (HOSPITAL_BASED_OUTPATIENT_CLINIC_OR_DEPARTMENT_OTHER): Payer: Self-pay

## 2021-06-25 ENCOUNTER — Other Ambulatory Visit: Payer: Self-pay | Admitting: Family Medicine

## 2021-06-25 MED ORDER — SEMAGLUTIDE-WEIGHT MANAGEMENT 0.25 MG/0.5ML ~~LOC~~ SOAJ
0.5000 mg | SUBCUTANEOUS | 3 refills | Status: DC
  Filled 2021-06-25: qty 3, 28d supply, fill #0
  Filled 2021-06-26: qty 2, 28d supply, fill #0

## 2021-06-26 ENCOUNTER — Other Ambulatory Visit (HOSPITAL_BASED_OUTPATIENT_CLINIC_OR_DEPARTMENT_OTHER): Payer: Self-pay

## 2021-06-27 ENCOUNTER — Other Ambulatory Visit (HOSPITAL_BASED_OUTPATIENT_CLINIC_OR_DEPARTMENT_OTHER): Payer: Self-pay

## 2021-06-28 ENCOUNTER — Other Ambulatory Visit (HOSPITAL_BASED_OUTPATIENT_CLINIC_OR_DEPARTMENT_OTHER): Payer: Self-pay

## 2021-06-29 ENCOUNTER — Other Ambulatory Visit (HOSPITAL_BASED_OUTPATIENT_CLINIC_OR_DEPARTMENT_OTHER): Payer: Self-pay

## 2021-07-02 ENCOUNTER — Other Ambulatory Visit (HOSPITAL_BASED_OUTPATIENT_CLINIC_OR_DEPARTMENT_OTHER): Payer: Self-pay

## 2021-07-03 ENCOUNTER — Other Ambulatory Visit (HOSPITAL_BASED_OUTPATIENT_CLINIC_OR_DEPARTMENT_OTHER): Payer: Self-pay

## 2021-07-04 ENCOUNTER — Other Ambulatory Visit (HOSPITAL_BASED_OUTPATIENT_CLINIC_OR_DEPARTMENT_OTHER): Payer: Self-pay

## 2021-07-05 ENCOUNTER — Other Ambulatory Visit (HOSPITAL_BASED_OUTPATIENT_CLINIC_OR_DEPARTMENT_OTHER): Payer: Self-pay

## 2021-07-06 ENCOUNTER — Other Ambulatory Visit (HOSPITAL_BASED_OUTPATIENT_CLINIC_OR_DEPARTMENT_OTHER): Payer: Self-pay

## 2021-07-10 ENCOUNTER — Other Ambulatory Visit (HOSPITAL_BASED_OUTPATIENT_CLINIC_OR_DEPARTMENT_OTHER): Payer: Self-pay

## 2021-07-11 DIAGNOSIS — F4323 Adjustment disorder with mixed anxiety and depressed mood: Secondary | ICD-10-CM | POA: Diagnosis not present

## 2021-07-11 NOTE — Telephone Encounter (Signed)
Was this a dose change for her to  0.'5mg'$  to 0.'25mg'$  or was she suppose to get 0.'5mg'$  please advise

## 2021-07-12 ENCOUNTER — Other Ambulatory Visit (HOSPITAL_BASED_OUTPATIENT_CLINIC_OR_DEPARTMENT_OTHER): Payer: Self-pay

## 2021-07-12 ENCOUNTER — Other Ambulatory Visit: Payer: Self-pay

## 2021-07-12 ENCOUNTER — Telehealth: Payer: Self-pay | Admitting: Family Medicine

## 2021-07-12 MED ORDER — SEMAGLUTIDE-WEIGHT MANAGEMENT 0.5 MG/0.5ML ~~LOC~~ SOAJ
0.5000 mg | SUBCUTANEOUS | 3 refills | Status: DC
Start: 2021-07-12 — End: 2021-07-31
  Filled 2021-07-12: qty 2, 28d supply, fill #0

## 2021-07-12 NOTE — Telephone Encounter (Signed)
Sent a my  chart message to pt regarding this

## 2021-07-12 NOTE — Telephone Encounter (Signed)
I re sent it-think I got it right this time  Let me know if that is not correct  Thanks

## 2021-07-13 ENCOUNTER — Other Ambulatory Visit (HOSPITAL_BASED_OUTPATIENT_CLINIC_OR_DEPARTMENT_OTHER): Payer: Self-pay

## 2021-07-13 NOTE — Telephone Encounter (Signed)
Prior auth started for [facility] 0.'5MG'$ /0.5ML auto-injectors. Roma Schanz (Key: [id number]) Rx #: [id number]  Waiting for determination.

## 2021-07-18 ENCOUNTER — Ambulatory Visit
Admission: RE | Admit: 2021-07-18 | Discharge: 2021-07-18 | Disposition: A | Discharge status: Home / Self Care | Payer: 59 | Source: Ambulatory Visit

## 2021-07-18 DIAGNOSIS — Z1231 Encounter for screening mammogram for malignant neoplasm of breast: Secondary | ICD-10-CM

## 2021-07-18 NOTE — Telephone Encounter (Signed)
Prior auth for [facility] 0.5MG/0.5ML auto-injectors has been denied. [name] (Key: [id number]) Rx #: [id number]  Based upon the information we received from you/your healthcare practitioner, we are unable to authorize WEGOVY 0.5 MG/0.5 ML PEN at this time. The reason(s) for this determination is (are):  We were asked to cover a larger amount of the drug listed at the top of this letter than  allowed under our quantity limit rule. In order for your request to be approved, your provider  would need to show that you have met the guideline rules below.   Your provider requested a quantity of three (3) mLs of Wegovy 0.67m/0.86mL pens per 28  days, but your plan allows for a quantity of two (2) mLs of Wegovy 0.528m0.86mL pens per 28  days for the management of weight loss.  For approval of a quantity of three (3) mLs of Wegovy 0.86m386m.86mL pens per 28 days, our guideline named GENERAL QUANTITY EXCEPTION CRITERIA (reviewed for [facility].86mg27m86mL pens) requires that you meet at least ALL of the following guideline rules: 1.Your provider has submitted two (2) high-quality articles from major peer-reviewed medical journals that support the safety and efficacy of the requested drug at the intended dosage for the specified indication.  2.You meet ONE of the following:  A.You are titrating the daily dosage of the medication up (you are slowly increasing the dose of your medication over time) to a dose that is within the Food and Drug Administration (FDA)-recommended maximum daily dose for the requested medication for your condition.  B.You meet ALL of the following:  i.You have tried and failed taking two (2) mLs of Wegovy 0.86mg/57mmL pens per 28 days and it did not work for you, unless there is a valid medical reason why you cannot. ii.You have tried the highest pen strength available that is covered under your pharmacy benefit to achieve the same total daily dose, this is called dose consolidation. This  means you must try taking two (2) mLs of Wegovy 0.86mg/066mL pens per 28 days instead of three (3) mLs of Wegovy 0.86mg/0.11m pens per 28 days, unless there is a valid medical reason why you cannot try dose consolidation.

## 2021-07-19 ENCOUNTER — Other Ambulatory Visit (HOSPITAL_BASED_OUTPATIENT_CLINIC_OR_DEPARTMENT_OTHER): Payer: Self-pay

## 2021-07-19 NOTE — Telephone Encounter (Signed)
Please change the px to dispense 2 Ml instead of 3 -looks like they will approve that according to this , if not please let me know

## 2021-07-20 ENCOUNTER — Other Ambulatory Visit (HOSPITAL_BASED_OUTPATIENT_CLINIC_OR_DEPARTMENT_OTHER): Payer: Self-pay

## 2021-07-20 NOTE — Telephone Encounter (Signed)
Pharmacy notified by telephone of change and verbalized understanding.

## 2021-07-27 ENCOUNTER — Telehealth: Payer: Self-pay

## 2021-07-27 NOTE — Telephone Encounter (Signed)
Okay great thanks

## 2021-07-27 NOTE — Telephone Encounter (Signed)
Disregard previous message.  Per patient message on mychart, she was able to pick up  [facility] prescription already.  She did say that she would need the '1mg'$  in three weeks.

## 2021-07-27 NOTE — Telephone Encounter (Signed)
Received appeal paperwork for Wegovy 0.'5MG'$ /0.5ML auto-injectors.  I have filled out paperwork, but need patient to sign form prior to faxing.  I sent patient a message to see how she would like me to send her the form to fill out.  Once I get this signed, I will fax in the appeal request.

## 2021-07-31 ENCOUNTER — Encounter: Payer: Self-pay | Admitting: Family Medicine

## 2021-07-31 ENCOUNTER — Other Ambulatory Visit (HOSPITAL_BASED_OUTPATIENT_CLINIC_OR_DEPARTMENT_OTHER): Payer: Self-pay

## 2021-07-31 MED ORDER — SEMAGLUTIDE-WEIGHT MANAGEMENT 1 MG/0.5ML ~~LOC~~ SOAJ
1.0000 mg | SUBCUTANEOUS | 3 refills | Status: DC
Start: 2021-07-31 — End: 2021-08-31
  Filled 2021-07-31: qty 2, 28d supply, fill #0
  Filled 2021-08-21: qty 2, 28d supply, fill #1

## 2021-08-05 ENCOUNTER — Other Ambulatory Visit: Payer: Self-pay | Admitting: Family Medicine

## 2021-08-06 ENCOUNTER — Other Ambulatory Visit (HOSPITAL_BASED_OUTPATIENT_CLINIC_OR_DEPARTMENT_OTHER): Payer: Self-pay

## 2021-08-06 MED ORDER — HYDROCHLOROTHIAZIDE 25 MG PO TABS
ORAL_TABLET | ORAL | 5 refills | Status: DC
  Filled 2021-08-06: qty 30, 30d supply, fill #0
  Filled 2021-09-05: qty 30, 30d supply, fill #1
  Filled 2021-11-18: qty 30, 30d supply, fill #2
  Filled 2022-01-23: qty 30, 30d supply, fill #3
  Filled 2022-03-23: qty 30, 30d supply, fill #4
  Filled 2022-05-22: qty 30, 30d supply, fill #5

## 2021-08-21 ENCOUNTER — Other Ambulatory Visit (HOSPITAL_BASED_OUTPATIENT_CLINIC_OR_DEPARTMENT_OTHER): Payer: Self-pay

## 2021-08-22 DIAGNOSIS — F4323 Adjustment disorder with mixed anxiety and depressed mood: Secondary | ICD-10-CM | POA: Diagnosis not present

## 2021-08-31 ENCOUNTER — Other Ambulatory Visit (HOSPITAL_BASED_OUTPATIENT_CLINIC_OR_DEPARTMENT_OTHER): Payer: Self-pay

## 2021-08-31 ENCOUNTER — Encounter: Payer: Self-pay | Admitting: Family Medicine

## 2021-08-31 MED ORDER — SEMAGLUTIDE-WEIGHT MANAGEMENT 1.7 MG/0.75ML ~~LOC~~ SOAJ
1.7000 mg | SUBCUTANEOUS | 3 refills | Status: DC
  Filled 2021-08-31: qty 3, 28d supply, fill #0
  Filled 2021-09-23: qty 3, 28d supply, fill #1

## 2021-09-05 ENCOUNTER — Other Ambulatory Visit (HOSPITAL_BASED_OUTPATIENT_CLINIC_OR_DEPARTMENT_OTHER): Payer: Self-pay

## 2021-09-19 DIAGNOSIS — F4323 Adjustment disorder with mixed anxiety and depressed mood: Secondary | ICD-10-CM | POA: Diagnosis not present

## 2021-09-21 ENCOUNTER — Other Ambulatory Visit (HOSPITAL_BASED_OUTPATIENT_CLINIC_OR_DEPARTMENT_OTHER): Payer: Self-pay

## 2021-09-24 ENCOUNTER — Other Ambulatory Visit (HOSPITAL_BASED_OUTPATIENT_CLINIC_OR_DEPARTMENT_OTHER): Payer: Self-pay

## 2021-09-28 ENCOUNTER — Other Ambulatory Visit (HOSPITAL_BASED_OUTPATIENT_CLINIC_OR_DEPARTMENT_OTHER): Payer: Self-pay

## 2021-10-24 ENCOUNTER — Encounter: Payer: Self-pay | Admitting: Family Medicine

## 2021-10-24 ENCOUNTER — Telehealth: Payer: Self-pay | Admitting: *Deleted

## 2021-10-24 ENCOUNTER — Other Ambulatory Visit (HOSPITAL_BASED_OUTPATIENT_CLINIC_OR_DEPARTMENT_OTHER): Payer: Self-pay

## 2021-10-24 MED ORDER — TRAZODONE HCL 50 MG PO TABS: 25.0000 mg | ORAL_TABLET | Freq: Every evening | ORAL | 0 refills | Status: DC | PRN

## 2021-10-24 MED ORDER — WEGOVY 2.4 MG/0.75ML ~~LOC~~ SOAJ
2.4000 mg | SUBCUTANEOUS | 3 refills | Status: DC
  Filled 2021-10-24: qty 3, 28d supply, fill #0
  Filled 2021-11-18: qty 3, 28d supply, fill #1
  Filled 2021-12-17: qty 3, 28d supply, fill #2
  Filled 2022-01-15: qty 3, 28d supply, fill #3

## 2021-10-24 NOTE — Telephone Encounter (Signed)
Pt sent a mychart message saying:  Good morning.. Would you send the wegovy 2.4 in?    I was losing about a pound a week with current dose until the last 2 weeks. it may be due to the stress of dad passing but thought inc the dose may help?  Thanks, [name]

## 2021-10-24 NOTE — Telephone Encounter (Signed)
I sent it  

## 2021-10-30 ENCOUNTER — Other Ambulatory Visit (HOSPITAL_BASED_OUTPATIENT_CLINIC_OR_DEPARTMENT_OTHER): Payer: Self-pay

## 2021-11-02 ENCOUNTER — Other Ambulatory Visit (HOSPITAL_BASED_OUTPATIENT_CLINIC_OR_DEPARTMENT_OTHER): Payer: Self-pay

## 2021-11-07 DIAGNOSIS — F4323 Adjustment disorder with mixed anxiety and depressed mood: Secondary | ICD-10-CM | POA: Diagnosis not present

## 2021-11-15 ENCOUNTER — Other Ambulatory Visit: Payer: Self-pay | Admitting: Family Medicine

## 2021-11-15 NOTE — Telephone Encounter (Signed)
See note from pharmacy, they are asking to change Rx to a 90 day supply, please advise

## 2021-11-19 ENCOUNTER — Other Ambulatory Visit (HOSPITAL_BASED_OUTPATIENT_CLINIC_OR_DEPARTMENT_OTHER): Payer: Self-pay

## 2021-11-28 ENCOUNTER — Other Ambulatory Visit: Payer: 59

## 2021-11-29 IMAGING — MG MM DIGITAL SCREENING BILAT W/ TOMO AND CAD
8 series · 8 of 24 positions shown · non-contrast
Comparison: Previous exam(s).

CLINICAL DATA: Screening.

EXAM:
DIGITAL SCREENING BILATERAL MAMMOGRAM WITH TOMOSYNTHESIS AND CAD
TECHNIQUE: Bilateral screening digital craniocaudal and mediolateral oblique
mammograms were obtained. Bilateral screening digital breast
tomosynthesis was performed. The images were evaluated with
computer-aided detection.

[R CC synth-2D]
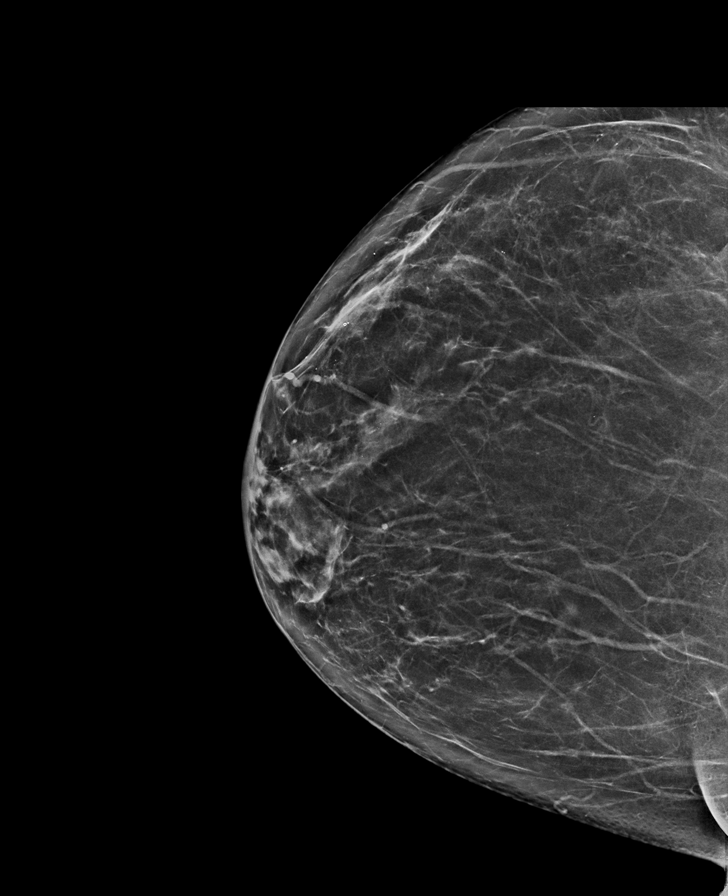

[R MLO synth-2D]
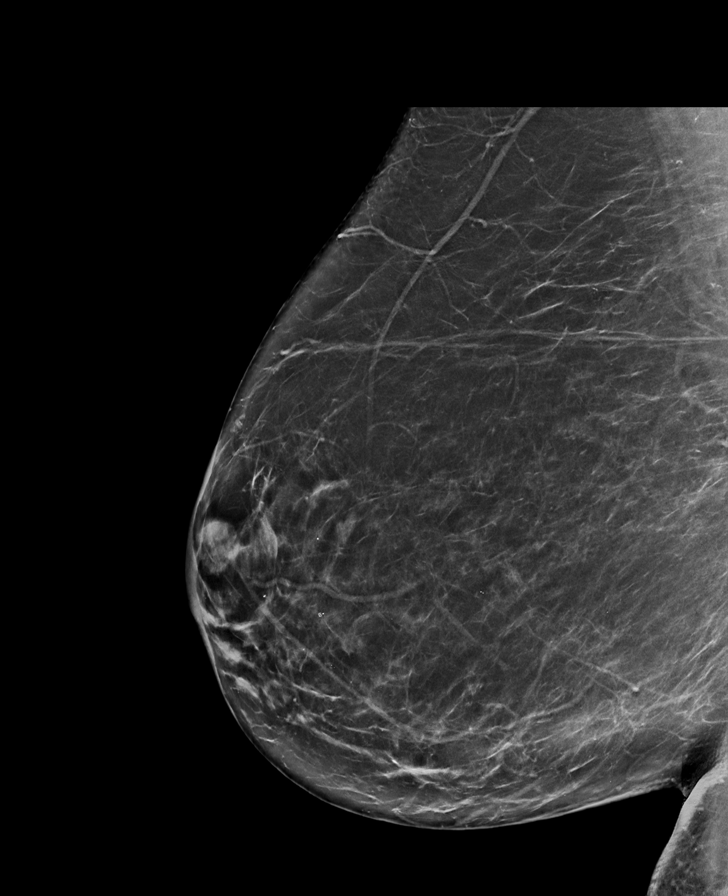

[L CC synth-2D]
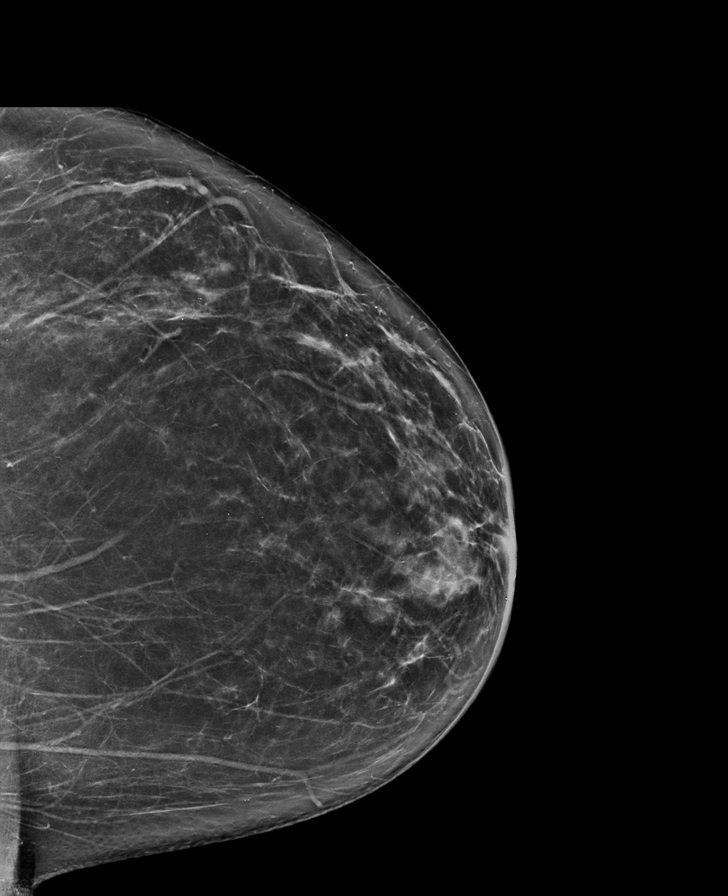

[L MLO synth-2D]
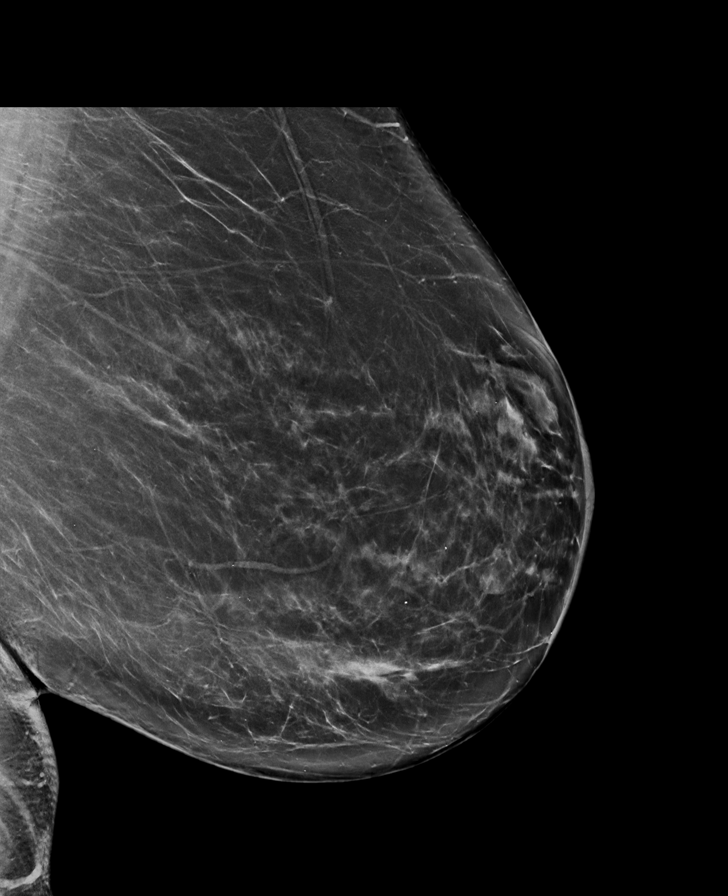

[L CC tomo · tomo slice 39/77.0]
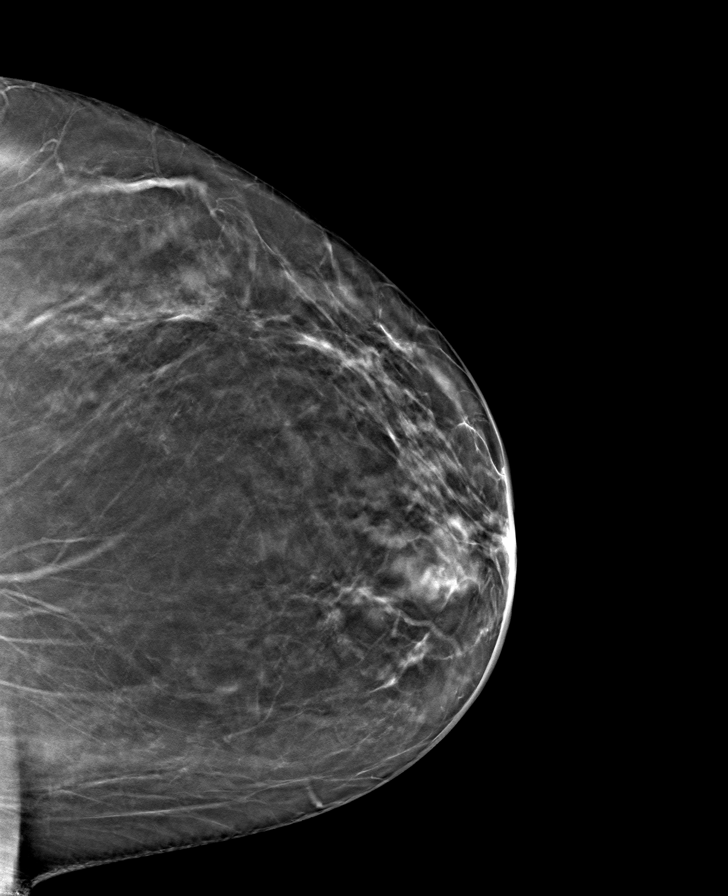

[L MLO tomo · tomo slice 43/85.0]
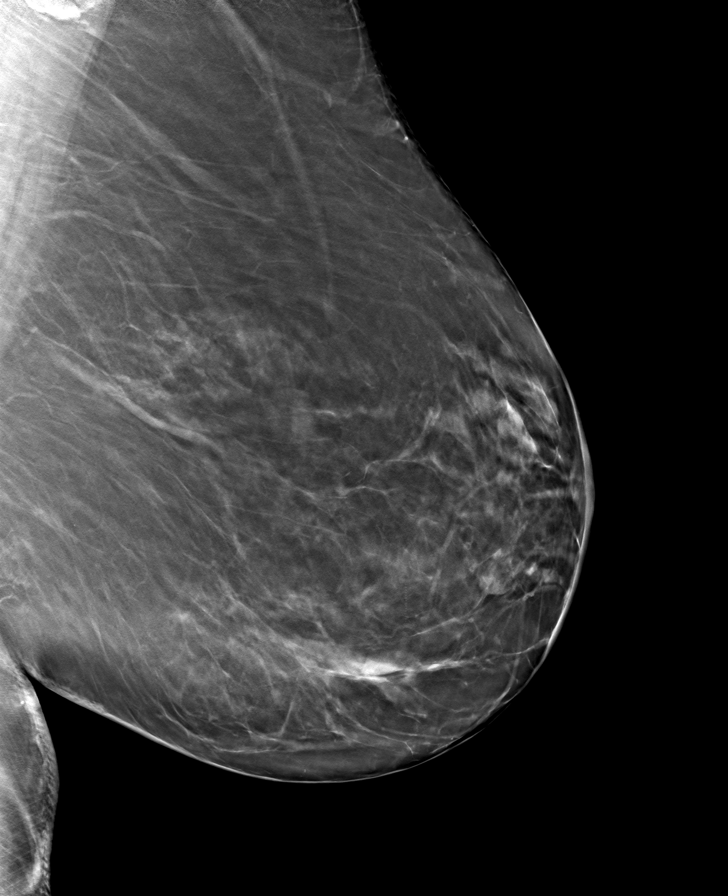

[R MLO tomo · tomo slice 41/81.0]
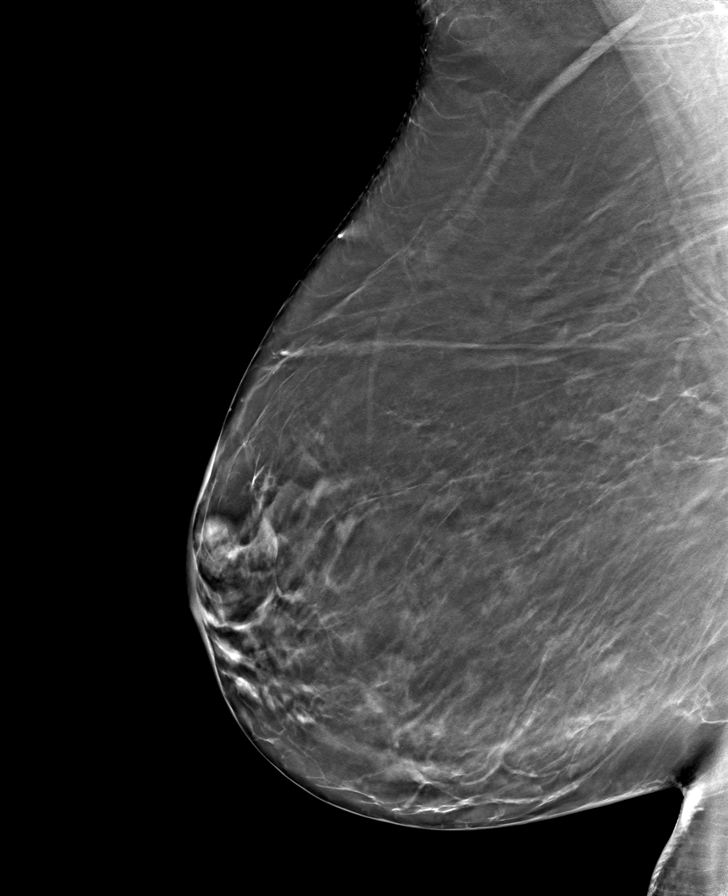

[R CC tomo · tomo slice 38/75.0]
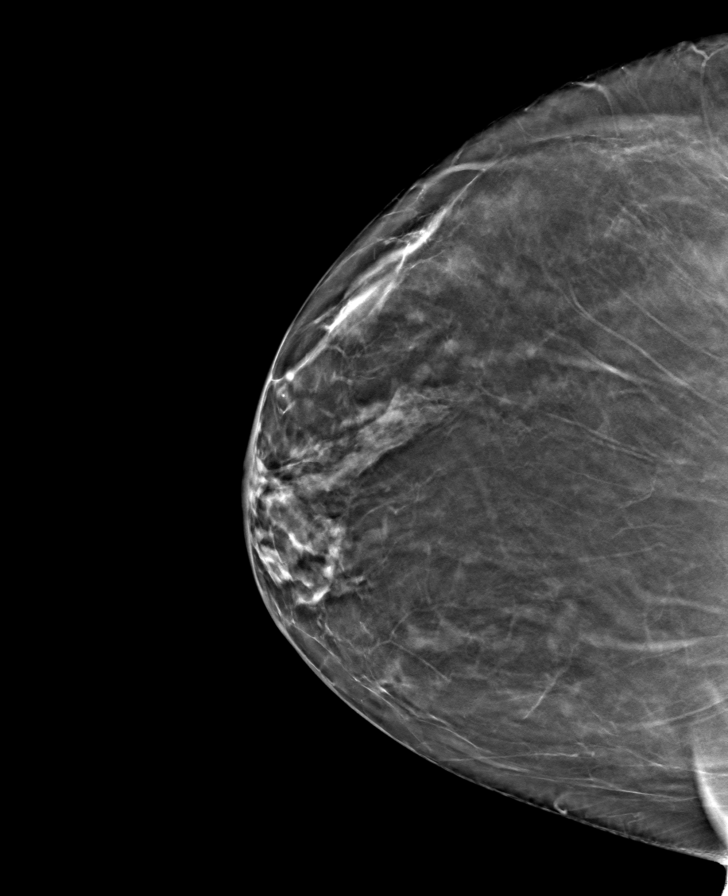

[8 of 24 positions shown; findings below may reference images not displayed]

ACR Breast Density Category b: There are scattered areas of
fibroglandular density.
FINDINGS: There are no findings suspicious for malignancy.
IMPRESSION: No mammographic evidence of malignancy. A result letter of this
screening mammogram will be mailed directly to the patient.

RECOMMENDATION:
Screening mammogram in one year. (Code:51-O-LD2)

BI-RADS CATEGORY  1: Negative.

## 2021-12-03 ENCOUNTER — Other Ambulatory Visit (HOSPITAL_BASED_OUTPATIENT_CLINIC_OR_DEPARTMENT_OTHER): Payer: Self-pay

## 2021-12-11 ENCOUNTER — Other Ambulatory Visit: Payer: Self-pay | Admitting: Family Medicine

## 2021-12-17 ENCOUNTER — Encounter: Payer: Self-pay | Admitting: Family Medicine

## 2021-12-17 ENCOUNTER — Other Ambulatory Visit (HOSPITAL_BASED_OUTPATIENT_CLINIC_OR_DEPARTMENT_OTHER): Payer: Self-pay

## 2021-12-17 MED ORDER — TRAZODONE HCL 50 MG PO TABS
25.0000 mg | ORAL_TABLET | Freq: Every evening | ORAL | 3 refills | Status: DC | PRN
  Filled 2021-12-17: qty 90, 90d supply, fill #0
  Filled 2022-03-08: qty 90, 90d supply, fill #1
  Filled 2022-05-27: qty 90, 90d supply, fill #2
  Filled 2022-08-08: qty 90, 90d supply, fill #3

## 2021-12-26 ENCOUNTER — Other Ambulatory Visit (HOSPITAL_BASED_OUTPATIENT_CLINIC_OR_DEPARTMENT_OTHER): Payer: Self-pay

## 2021-12-26 DIAGNOSIS — Z6841 Body Mass Index (BMI) 40.0 and over, adult: Secondary | ICD-10-CM | POA: Diagnosis not present

## 2021-12-26 DIAGNOSIS — Z01419 Encounter for gynecological examination (general) (routine) without abnormal findings: Secondary | ICD-10-CM | POA: Diagnosis not present

## 2021-12-26 DIAGNOSIS — Z124 Encounter for screening for malignant neoplasm of cervix: Secondary | ICD-10-CM | POA: Diagnosis not present

## 2021-12-26 DIAGNOSIS — Z13820 Encounter for screening for osteoporosis: Secondary | ICD-10-CM | POA: Diagnosis not present

## 2021-12-26 LAB — HM DEXA SCAN: HM Dexa Scan: NORMAL

## 2021-12-26 MED ORDER — VENLAFAXINE HCL ER 150 MG PO CP24
150.0000 mg | ORAL_CAPSULE | Freq: Every day | ORAL | 3 refills | Status: DC
  Filled 2021-12-26 – 2021-12-27 (×2): qty 90, 90d supply, fill #0
  Filled 2022-03-23: qty 90, 90d supply, fill #1
  Filled 2022-07-03: qty 90, 90d supply, fill #2
  Filled 2022-10-17: qty 90, 90d supply, fill #3

## 2021-12-27 ENCOUNTER — Other Ambulatory Visit: Payer: Self-pay

## 2022-01-14 ENCOUNTER — Other Ambulatory Visit (HOSPITAL_BASED_OUTPATIENT_CLINIC_OR_DEPARTMENT_OTHER): Payer: Self-pay

## 2022-01-15 ENCOUNTER — Other Ambulatory Visit (HOSPITAL_BASED_OUTPATIENT_CLINIC_OR_DEPARTMENT_OTHER): Payer: Self-pay

## 2022-01-15 MED ORDER — VEOZAH 45 MG PO TABS
45.0000 mg | ORAL_TABLET | Freq: Every day | ORAL | 11 refills | Status: DC
  Filled 2022-01-15: qty 30, 30d supply, fill #0
  Filled 2022-02-11: qty 30, 30d supply, fill #1
  Filled 2022-03-13: qty 30, 30d supply, fill #2
  Filled 2022-04-13: qty 30, 30d supply, fill #3
  Filled 2022-05-22: qty 30, 30d supply, fill #4
  Filled 2022-06-16: qty 30, 30d supply, fill #5
  Filled 2022-07-17: qty 30, 30d supply, fill #6
  Filled 2022-08-16: qty 30, 30d supply, fill #7
  Filled 2022-09-15: qty 30, 30d supply, fill #8
  Filled 2022-10-17: qty 30, 30d supply, fill #9
  Filled 2022-11-13: qty 30, 30d supply, fill #10
  Filled 2022-12-14: qty 30, 30d supply, fill #11

## 2022-01-16 ENCOUNTER — Other Ambulatory Visit (HOSPITAL_BASED_OUTPATIENT_CLINIC_OR_DEPARTMENT_OTHER): Payer: Self-pay

## 2022-01-17 ENCOUNTER — Other Ambulatory Visit (HOSPITAL_BASED_OUTPATIENT_CLINIC_OR_DEPARTMENT_OTHER): Payer: Self-pay

## 2022-01-21 ENCOUNTER — Other Ambulatory Visit (HOSPITAL_BASED_OUTPATIENT_CLINIC_OR_DEPARTMENT_OTHER): Payer: Self-pay

## 2022-01-22 ENCOUNTER — Other Ambulatory Visit (HOSPITAL_BASED_OUTPATIENT_CLINIC_OR_DEPARTMENT_OTHER): Payer: Self-pay

## 2022-01-23 ENCOUNTER — Other Ambulatory Visit (HOSPITAL_BASED_OUTPATIENT_CLINIC_OR_DEPARTMENT_OTHER): Payer: Self-pay

## 2022-01-24 ENCOUNTER — Other Ambulatory Visit (HOSPITAL_BASED_OUTPATIENT_CLINIC_OR_DEPARTMENT_OTHER): Payer: Self-pay

## 2022-01-29 ENCOUNTER — Other Ambulatory Visit (HOSPITAL_BASED_OUTPATIENT_CLINIC_OR_DEPARTMENT_OTHER): Payer: Self-pay

## 2022-01-30 ENCOUNTER — Other Ambulatory Visit (HOSPITAL_BASED_OUTPATIENT_CLINIC_OR_DEPARTMENT_OTHER): Payer: Self-pay

## 2022-01-31 ENCOUNTER — Other Ambulatory Visit (HOSPITAL_BASED_OUTPATIENT_CLINIC_OR_DEPARTMENT_OTHER): Payer: Self-pay

## 2022-02-01 ENCOUNTER — Other Ambulatory Visit (HOSPITAL_BASED_OUTPATIENT_CLINIC_OR_DEPARTMENT_OTHER): Payer: Self-pay

## 2022-02-04 ENCOUNTER — Other Ambulatory Visit (HOSPITAL_BASED_OUTPATIENT_CLINIC_OR_DEPARTMENT_OTHER): Payer: Self-pay

## 2022-02-04 ENCOUNTER — Encounter: Payer: Self-pay | Admitting: Family Medicine

## 2022-02-05 ENCOUNTER — Other Ambulatory Visit (HOSPITAL_BASED_OUTPATIENT_CLINIC_OR_DEPARTMENT_OTHER): Payer: Self-pay

## 2022-02-05 MED ORDER — TIRZEPATIDE-WEIGHT MANAGEMENT 5 MG/0.5ML ~~LOC~~ SOAJ
5.0000 mg | SUBCUTANEOUS | 3 refills | Status: DC
  Filled 2022-02-05: qty 2, 28d supply, fill #0

## 2022-02-06 ENCOUNTER — Telehealth: Payer: Self-pay

## 2022-02-06 ENCOUNTER — Other Ambulatory Visit (HOSPITAL_BASED_OUTPATIENT_CLINIC_OR_DEPARTMENT_OTHER): Payer: Self-pay

## 2022-02-06 DIAGNOSIS — F4323 Adjustment disorder with mixed anxiety and depressed mood: Secondary | ICD-10-CM | POA: Diagnosis not present

## 2022-02-06 NOTE — Telephone Encounter (Signed)
Patient Advocate Encounter  Prior Authorization for [employer] 2.'4MG'$ /0.75ML auto-injectors has been approved.    PA# [id number] Effective dates: 02/05/22 through 02/05/23

## 2022-02-07 ENCOUNTER — Other Ambulatory Visit (HOSPITAL_BASED_OUTPATIENT_CLINIC_OR_DEPARTMENT_OTHER): Payer: Self-pay

## 2022-02-08 ENCOUNTER — Other Ambulatory Visit (HOSPITAL_BASED_OUTPATIENT_CLINIC_OR_DEPARTMENT_OTHER): Payer: Self-pay

## 2022-02-08 NOTE — Telephone Encounter (Signed)
PA has been approved for [facility] in previous encounter. Went ahead and submitted PA for Zepbound for patient as well.  Patient Advocate Encounter   Received notification from [place] that prior authorization for Zepbound is required.   PA submitted on 02/08/2022 Key [id number] Status is pending

## 2022-02-11 ENCOUNTER — Other Ambulatory Visit (HOSPITAL_BASED_OUTPATIENT_CLINIC_OR_DEPARTMENT_OTHER): Payer: Self-pay

## 2022-02-11 NOTE — Telephone Encounter (Signed)
Pharmacy Patient Advocate Encounter  Prior Authorization for Zepbound has been approved.    PA# [id number] Effective dates: 02/08/2022 through 08/07/2022

## 2022-02-20 ENCOUNTER — Other Ambulatory Visit (HOSPITAL_BASED_OUTPATIENT_CLINIC_OR_DEPARTMENT_OTHER): Payer: Self-pay

## 2022-02-25 ENCOUNTER — Other Ambulatory Visit (HOSPITAL_BASED_OUTPATIENT_CLINIC_OR_DEPARTMENT_OTHER): Payer: Self-pay

## 2022-02-25 MED ORDER — TIRZEPATIDE-WEIGHT MANAGEMENT 7.5 MG/0.5ML ~~LOC~~ SOAJ
7.5000 mg | SUBCUTANEOUS | 1 refills | Status: DC
  Filled 2022-02-25: qty 2, 28d supply, fill #0

## 2022-02-25 NOTE — Addendum Note (Signed)
Addended by: Loura Pardon A on: 02/25/2022 02:01 PM   Modules accepted: Orders

## 2022-03-06 ENCOUNTER — Other Ambulatory Visit (HOSPITAL_BASED_OUTPATIENT_CLINIC_OR_DEPARTMENT_OTHER): Payer: Self-pay

## 2022-03-06 ENCOUNTER — Ambulatory Visit: Payer: Commercial Managed Care - PPO | Admitting: Dermatology

## 2022-03-06 DIAGNOSIS — L988 Other specified disorders of the skin and subcutaneous tissue: Secondary | ICD-10-CM

## 2022-03-06 DIAGNOSIS — L720 Epidermal cyst: Secondary | ICD-10-CM

## 2022-03-06 DIAGNOSIS — L219 Seborrheic dermatitis, unspecified: Secondary | ICD-10-CM | POA: Diagnosis not present

## 2022-03-06 DIAGNOSIS — L570 Actinic keratosis: Secondary | ICD-10-CM

## 2022-03-06 MED ORDER — TRIAMCINOLONE ACETONIDE 0.1 % EX CREA
1.0000 | TOPICAL_CREAM | Freq: Two times a day (BID) | CUTANEOUS | 2 refills | Status: AC | PRN
  Filled 2022-03-06: qty 15, 8d supply, fill #0
  Filled 2022-05-22: qty 15, 8d supply, fill #1

## 2022-03-06 NOTE — Progress Notes (Signed)
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 It was raised, itchy and has bled in the past. Also with a spot at each ear. ).  Patient with hx of AK.  The following portions of the chart were reviewed this encounter and updated as appropriate:   Tobacco  Allergies  Meds  Problems  Med Hx  Surg Hx  Fam Hx      Review of Systems:  No other skin or systemic complaints except as noted in HPI or Assessment and Plan.  Objective  Well appearing patient in no apparent distress; mood and affect are within normal limits.  A focused examination was performed including back, arms, neck, ears, face. Relevant physical exam findings are noted in the Assessment and Plan.  Right Lower Back Subcutaneous nodule.   Right Ear Scaly erythematous patch  left conchal bowl Erythematous thin papules/macules with gritty scale.   face Rhytides and volume loss.     Assessment & Plan  Epidermal inclusion cyst Right Lower Back  Benign-appearing. Exam most consistent with an epidermal inclusion cyst. Discussed that a cyst is a benign growth that can grow over time and sometimes get irritated or inflamed. Recommend observation if it is not bothersome. Discussed option of surgical excision to remove it if it is growing, symptomatic, or other changes noted. Please call for new or changing lesions so they can be evaluated.  Seborrheic dermatitis Right Ear  Most c/w seborrheic dermatitis. Call if not clearing with topical steroid as prescribed.  Seborrheic Dermatitis  -  is a chronic persistent rash characterized by pinkness and scaling most commonly of the mid face but also can occur on the scalp (dandruff), ears; mid chest, mid back and groin.  It tends to be exacerbated by stress and cooler weather.  People who have  neurologic disease may experience new onset or exacerbation of existing seborrheic dermatitis.  The condition is not curable but treatable and can be controlled.  Recommend washing area with over the counter dandruff shampoo.   Start TMC 0.1% cream 1-2 times daily as needed for up to 2 weeks. Avoid applying to face, groin, and axilla. Use as directed. Long-term use can cause thinning of the skin.    Topical steroids (such as triamcinolone, fluocinolone, fluocinonide, mometasone, clobetasol, halobetasol, betamethasone, hydrocortisone) can cause thinning and lightening of the skin if they are used for too long in the same area. Your physician has selected the right strength medicine for your problem and area affected on the body. Please use your medication only as directed by your physician to prevent side effects.    triamcinolone cream (KENALOG) 0.1 % - Right Ear Apply 1 Application topically 2 (two) times daily as needed. Avoid applying to face, groin, and axilla. Use as directed. Long-term use can cause thinning of the skin.  AK (actinic keratosis) left conchal bowl  Actinic keratoses are precancerous spots that appear secondary to cumulative UV radiation exposure/sun exposure over time. They are chronic with expected duration over 1 year. A portion of actinic keratoses will progress to squamous cell carcinoma of the skin. It is not possible to reliably predict which spots will progress to skin cancer and so treatment is recommended to prevent development of skin cancer.  Recommend daily broad spectrum sunscreen SPF 30+ to sun-exposed areas, reapply  every 2 hours as needed.  Recommend staying in the shade or wearing long sleeves, sun glasses (UVA+UVB protection) and wide brim hats (4-inch brim around the entire circumference of the hat). Call for new or changing lesions.  Prior to procedure, discussed risks of blister formation, small wound, skin dyspigmentation, or rare scar following  cryotherapy. Recommend Vaseline ointment to treated areas while healing.   Destruction of lesion - left conchal bowl  Destruction method: cryotherapy   Informed consent: discussed and consent obtained   Lesion destroyed using liquid nitrogen: Yes   Cryotherapy cycles:  2 Outcome: patient tolerated procedure well with no complications   Post-procedure details: wound care instructions given    Elastosis of skin face  Will prescribe [facility] Anti-Aging Tretinoin 0.05%/Niacinamide/Vitamin C/Vitamin E/Turmeric/Resveratrol with Hyaluronic Acid. Apply pea sized amount nightly to the entire face.  The patient was advised this is not covered by insurance since it is made by a compounding pharmacy. They will receive an email to check out and the medication will be mailed to their home.   Topical retinoid medications like tretinoin can cause dryness and irritation when first started. Only apply a pea-sized amount to the entire affected area. Avoid applying it around the eyes, edges of mouth and creases at the nose. If you experience irritation, use a good moisturizer first and/or apply the medicine less often. If you are doing well with the medicine, you can increase how often you use it until you are applying every night. Be careful with sun protection while using this medication as it can make you sensitive to the sun. This medicine should not be used by pregnant women.     Return for 1-2 month TBSE, AK follow up.  Graciella Belton, RMA, am acting as scribe for Forest Gleason, MD .  Documentation: I have reviewed the above documentation for accuracy and completeness, and I agree with the above.  Forest Gleason, MD

## 2022-03-06 NOTE — Patient Instructions (Addendum)
Cryotherapy Aftercare  Wash gently with soap and water everyday.   Apply Vaseline and Band-Aid daily until healed.   Instructions for [facility] Medications  One or more of your medications was sent to the [facility] mail order compounding pharmacy. You will receive an email from them and can purchase the medicine through that link. It will then be mailed to your home at the address you confirmed. If for any reason you do not receive an email from them, please check your spam folder. If you still do not find the email, please let us know. [facility] phone number is [phone].  Will prescribe [facility] Anti-Aging Tretinoin 0.05%/Niacinamide/Vitamin C/Vitamin E/Turmeric/Resveratrol with Hyaluronic Acid. Apply pea sized amount nightly to the entire face.  The patient was advised this is not covered by insurance since it is made by a compounding pharmacy. They will receive an email to check out and the medication will be mailed to their home.   Topical retinoid medications like tretinoin can cause dryness and irritation when first started. Only apply a pea-sized amount to the entire affected area. Avoid applying it around the eyes, edges of mouth and creases at the nose. If you experience irritation, use a good moisturizer first and/or apply the medicine less often. If you are doing well with the medicine, you can increase how often you use it until you are applying every night. Be careful with sun protection while using this medication as it can make you sensitive to the sun. This medicine should not be used by pregnant women.   Recommend taking Heliocare sun protection supplement daily in sunny weather for additional sun protection. For maximum protection on the sunniest days, you can take up to 2 capsules of regular Heliocare OR take 1 capsule of Heliocare Ultra. For prolonged exposure (such as a full day in the sun), you can repeat your dose of the supplement 4 hours after your  first dose. Heliocare can be purchased at [employer], at some [facility] or at [email or web address].    Melanoma ABCDEs  Melanoma is the most dangerous type of skin cancer, and is the leading cause of death from skin disease.  You are more likely to develop melanoma if you: Have light-colored skin, light-colored eyes, or red or blond hair Spend a lot of time in the sun Tan regularly, either outdoors or in a tanning bed Have had blistering sunburns, especially during childhood Have a close family member who has had a melanoma Have atypical moles or large birthmarks  Early detection of melanoma is key since treatment is typically straightforward and cure rates are extremely high if we catch it early.   The first sign of melanoma is often a change in a mole or a new dark spot.  The ABCDE system is a way of remembering the signs of melanoma.  A for asymmetry:  The two halves do not match. B for border:  The edges of the growth are irregular. C for color:  A mixture of colors are present instead of an even brown color. D for diameter:  Melanomas are usually (but not always) greater than 75m - the size of a pencil eraser. E for evolution:  The spot keeps changing in size, shape, and color.  Please check your skin once per month between visits. You can use a small mirror in front and a large mirror behind you to keep an eye on the back side or your body.   If you see any new  or changing lesions before your next follow-up, please call to schedule a visit.  Please continue daily skin protection including broad spectrum sunscreen SPF 30+ to sun-exposed areas, reapplying every 2 hours as needed when you're outdoors.    Due to recent changes in healthcare laws, you may see results of your pathology and/or laboratory studies on MyChart before the doctors have had a chance to review them. We understand that in some cases there may be results that are confusing or concerning to you. Please  understand that not all results are received at the same time and often the doctors may need to interpret multiple results in order to provide you with the best plan of care or course of treatment. Therefore, we ask that you please give [place] 2 business days to thoroughly review all your results before contacting the office for clarification. Should we see a critical lab result, you will be contacted sooner.   If You Need Anything After Your Visit  If you have any questions or concerns for your doctor, please call our main line at [phone] and press option 4 to reach your doctor's medical assistant. If no one answers, please leave a voicemail as directed and we will return your call as soon as possible. Messages left after 4 pm will be answered the following business day.   You may also send [place] a message via [employer]. We typically respond to MyChart messages within 1-2 business days.  For prescription refills, please ask your pharmacy to contact our office. Our fax number is [phone].  If you have an urgent issue when the clinic is closed that cannot wait until the next business day, you can page your doctor at the number below.    Please note that while we do our best to be available for urgent issues outside of office hours, we are not available 24/7.   If you have an urgent issue and are unable to reach [place], you may choose to seek medical care at your doctor's office, retail clinic, urgent care center, or emergency room.  If you have a medical emergency, please immediately call 911 or go to the emergency department.  Pager Numbers  - Dr. Nehemiah Massed: [phone]  - Dr. Laurence Ferrari: [phone]  - Dr. Nicole Kindred: [phone]  In the event of inclement weather, please call our main line at [phone] for an update on the status of any delays or closures.  Dermatology Medication Tips: Please keep the boxes that topical medications come in in order to help keep track of the instructions about  where and how to use these. Pharmacies typically print the medication instructions only on the boxes and not directly on the medication tubes.   If your medication is too expensive, please contact our office at [phone] option 4 or send [place] a message through [place] [facility].   We are unable to tell what your co-pay for medications will be in advance as this is different depending on your insurance coverage. However, we may be able to find a substitute medication at lower cost or fill out paperwork to get insurance to cover a needed medication.   If a prior authorization is required to get your medication covered by your insurance company, please allow [place] 1-2 business days to complete this process.  Drug prices often vary depending on where the prescription is filled and some pharmacies may offer cheaper prices.  The website www.goodrx.com contains coupons for medications through different pharmacies. The prices here do not account for what the  cost may be with help from insurance (it may be cheaper with your insurance), but the website can give you the price if you did not use any insurance.  - You can print the associated coupon and take it with your prescription to the pharmacy.  - You may also stop by our office during regular business hours and pick up a GoodRx coupon card.  - If you need your prescription sent electronically to a different pharmacy, notify our office through [facility] or by phone at [phone] option 4.     Si Usted Necesita Algo Despus de Su Visita  Tambin puede enviarnos un mensaje a travs de Pharmacist, community. Por lo general respondemos a los mensajes de MyChart en el transcurso de 1 a 2 das hbiles.  Para renovar recetas, por favor pida a su farmacia que se ponga en contacto con nuestra oficina. Harland Dingwall de fax es [place] [phone].  Si tiene un asunto urgente cuando la clnica est cerrada y que no puede esperar hasta el siguiente da hbil, puede  llamar/localizar a su doctor(a) al nmero que aparece a continuacin.   Por favor, tenga en cuenta que aunque hacemos todo lo posible para estar disponibles para asuntos urgentes fuera del horario de [place], no estamos disponibles las 24 horas del da, los 7 das de la [place].   Si tiene un problema urgente y no puede comunicarse con nosotros, puede optar por buscar atencin mdica  en el consultorio de su doctor(a), en una clnica privada, en un centro de atencin urgente o en una sala de emergencias.  Si tiene Engineering geologist, por favor llame inmediatamente al 911 o vaya a la sala de emergencias.  Nmeros de bper  - Dr. Nehemiah Massed: [phone]  - Dra. Moye: [phone]  - Dra. Nicole Kindred: [phone]  En caso de inclemencias del [place], por favor llame a Johnsie Kindred principal al [phone] para una actualizacin sobre el [place] de cualquier retraso o cierre.  Consejos para la medicacin en dermatologa: Por favor, guarde las cajas en las que vienen los medicamentos de uso tpico para ayudarle a seguir las instrucciones sobre dnde y cmo usarlos. Las farmacias generalmente imprimen las instrucciones del medicamento slo en las cajas y no directamente en los tubos del [place].   Si su medicamento es muy caro, por favor, pngase en contacto con Zigmund Daniel llamando al [phone] y presione la opcin 4 o envenos un mensaje a travs de Pharmacist, community.   No podemos decirle cul ser su copago por los medicamentos por adelantado ya que esto es diferente dependiendo de la cobertura de su seguro. Sin embargo, es posible que podamos encontrar un medicamento sustituto a Electrical engineer un formulario para que el seguro cubra el medicamento que se considera necesario.   Si se requiere una autorizacin previa para que su compaa de seguros [place] su medicamento, por favor permtanos de 1 a 2 das hbiles para completar este proceso.  Los precios de los medicamentos varan con  frecuencia dependiendo del Environmental consultant de dnde se surte la receta y alguna farmacias pueden ofrecer precios ms baratos.  El sitio web www.goodrx.com tiene cupones para medicamentos de Airline pilot. Los precios aqu no tienen en cuenta lo que podra costar con la ayuda del seguro (puede ser ms barato con su seguro), pero el sitio web puede darle el precio si no utiliz Research scientist (physical sciences).  - Puede imprimir el cupn correspondiente y llevarlo con su receta a la farmacia.  - Tambin puede pasar por  nuestra oficina durante el horario de atencin regular y Charity fundraiser una tarjeta de cupones de GoodRx.  - Si necesita que su receta se enve electrnicamente a una farmacia diferente, informe a nuestra oficina a travs de MyChart de [place] o por telfono llamando al [phone] y presione la opcin 4.

## 2022-03-08 ENCOUNTER — Encounter: Payer: Self-pay | Admitting: Dermatology

## 2022-03-13 ENCOUNTER — Other Ambulatory Visit: Payer: Self-pay

## 2022-03-13 ENCOUNTER — Other Ambulatory Visit (HOSPITAL_BASED_OUTPATIENT_CLINIC_OR_DEPARTMENT_OTHER): Payer: Self-pay

## 2022-03-13 MED ORDER — TIRZEPATIDE-WEIGHT MANAGEMENT 10 MG/0.5ML ~~LOC~~ SOAJ
10.0000 mg | SUBCUTANEOUS | 3 refills | Status: DC
  Filled 2022-03-13: qty 6, 84d supply, fill #0

## 2022-03-13 NOTE — Addendum Note (Signed)
Addended by: Loura Pardon A on: 03/13/2022 01:04 PM   Modules accepted: Orders

## 2022-03-14 ENCOUNTER — Other Ambulatory Visit (HOSPITAL_BASED_OUTPATIENT_CLINIC_OR_DEPARTMENT_OTHER): Payer: Self-pay

## 2022-05-08 ENCOUNTER — Encounter: Payer: Self-pay | Admitting: Dermatology

## 2022-05-08 ENCOUNTER — Ambulatory Visit (INDEPENDENT_AMBULATORY_CARE_PROVIDER_SITE_OTHER): Payer: Commercial Managed Care - PPO | Admitting: Dermatology

## 2022-05-08 DIAGNOSIS — L814 Other melanin hyperpigmentation: Secondary | ICD-10-CM | POA: Diagnosis not present

## 2022-05-08 DIAGNOSIS — L570 Actinic keratosis: Secondary | ICD-10-CM | POA: Diagnosis not present

## 2022-05-08 DIAGNOSIS — L578 Other skin changes due to chronic exposure to nonionizing radiation: Secondary | ICD-10-CM | POA: Diagnosis not present

## 2022-05-08 DIAGNOSIS — L219 Seborrheic dermatitis, unspecified: Secondary | ICD-10-CM | POA: Diagnosis not present

## 2022-05-08 DIAGNOSIS — Z1283 Encounter for screening for malignant neoplasm of skin: Secondary | ICD-10-CM

## 2022-05-08 DIAGNOSIS — D229 Melanocytic nevi, unspecified: Secondary | ICD-10-CM | POA: Diagnosis not present

## 2022-05-08 DIAGNOSIS — L720 Epidermal cyst: Secondary | ICD-10-CM

## 2022-05-08 DIAGNOSIS — L719 Rosacea, unspecified: Secondary | ICD-10-CM | POA: Diagnosis not present

## 2022-05-08 DIAGNOSIS — L821 Other seborrheic keratosis: Secondary | ICD-10-CM | POA: Diagnosis not present

## 2022-05-08 MED ORDER — METRONIDAZOLE 0.75 % EX CREA: TOPICAL_CREAM | CUTANEOUS | 2 refills | Status: DC

## 2022-05-08 NOTE — Progress Notes (Unsigned)
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 The patient has spots, moles and lesions to be evaluated, some may be new or changing and the patient has concerns that these could be cancer.  FAMILY HISTORY OF SKIN CANCER What type(s):BCC, SCC Who affected:mother   The following portions of the chart were reviewed this encounter and updated as appropriate: medications, allergies, medical history  Review of Systems:  No other skin or systemic complaints except as noted in HPI or Assessment and Plan.  Objective  Well appearing patient in no apparent distress; mood and affect are within normal limits.  A full examination was performed including scalp, head, eyes, ears, nose, lips, neck, chest, axillae, abdomen, back, buttocks, bilateral upper extremities, bilateral lower extremities, hands, feet, fingers, toes, fingernails, and toenails. All findings within normal limits unless otherwise noted below.   Relevant physical exam findings are noted in the Assessment and Plan.  residual at left conchal bowl x 1 Erythematous thin papules/macules with gritty scale.     Assessment & Plan   LENTIGINES, SEBORRHEIC KERATOSES, HEMANGIOMAS - Benign normal skin lesions - Benign-appearing - Call for any changes  MELANOCYTIC NEVI - Tan-brown and/or pink-flesh-colored symmetric macules and papules - Benign appearing on exam today - Observation - Call clinic for new or changing moles - Recommend daily use of broad spectrum spf 30+ sunscreen to sun-exposed areas.   ACTINIC DAMAGE - Chronic condition, secondary to cumulative UV/sun exposure - diffuse scaly erythematous macules with underlying dyspigmentation - Recommend daily broad spectrum sunscreen SPF 30+ to sun-exposed areas, reapply every 2  hours as needed.  - Staying in the shade or wearing long sleeves, sun glasses (UVA+UVB protection) and wide brim hats (4-inch brim around the entire circumference of the hat) are also recommended for sun protection.  - Call for new or changing lesions.  SKIN CANCER SCREENING PERFORMED TODAY.    AK (actinic keratosis) residual at left conchal bowl x 1  Actinic keratoses are precancerous spots that appear secondary to cumulative UV radiation exposure/sun exposure over time. They are chronic with expected duration over 1 year. A portion of actinic keratoses will progress to squamous cell carcinoma of the skin. It is not possible to reliably predict which spots will progress to skin cancer and so treatment is recommended to prevent development of skin cancer.  Recommend daily broad spectrum sunscreen SPF 30+ to sun-exposed areas, reapply every 2 hours as needed.  Recommend staying in the shade or wearing long sleeves, sun glasses (UVA+UVB protection) and wide brim hats (4-inch brim around the entire circumference of the hat). Call for new or changing lesions.  Prior to procedure, discussed risks of blister formation, small wound, skin dyspigmentation, or rare scar following cryotherapy. Recommend Vaseline ointment to treated areas while healing.  Call if not resolved.  SEBORRHEIC DERMATITIS/SEBOPSORIASIS Exam: Pink patches with greasy scale at right conchal bowl  Chronic and persistent condition with duration or expected duration over one year. Condition is symptomatic/ bothersome to patient. Not currently at goal.  Seborrheic Dermatitis is a chronic persistent rash characterized by pinkness and scaling most commonly of the mid face but also can occur on the scalp (dandruff), ears; mid chest, mid back and groin.  It tends to be exacerbated by stress and cooler weather.  People who  have neurologic disease may experience new onset or exacerbation of existing seborrheic dermatitis.  The condition  is not curable but treatable and can be controlled.  Treatment Plan: Restart TMC 0.1% cream 1-2 times daily x 2 weeks. Avoid applying to face, groin, and axilla. Use as directed. Long-term use can cause thinning of the skin. Apply Zoryve once a day to affected areas. Sample given. Lot: [id number] Exp: 03/2023  ROSACEA Exam Mid face erythema with telangiectasias + scattered inflammatory papules  Chronic and persistent condition with duration or expected duration over one year. Condition is symptomatic/ bothersome to patient. Not currently at goal.  Rosacea is a chronic progressive skin condition usually affecting the face of adults, causing redness and/or acne bumps. It is treatable but not curable. It sometimes affects the eyes (ocular rosacea) as well. It may respond to topical and/or systemic medication and can flare with stress, sun exposure, alcohol, exercise, topical steroids (including hydrocortisone/cortisone 10) and some foods.  Daily application of broad spectrum spf 30+ sunscreen to face is recommended to reduce flares.  Patient c/o grittiness of the eyes occasionally. Recommend patient follow up with eye dr and see if they think she could have ocular rosacea. She has appt next week.  Treatment Plan Start metronidazole 0.75% cream 1-2 times daily.   If not improving, will switch to [facility] Oxymetazoline 1% / Ivermectin 1% / Niacinamide 2% Cream.  EPIDERMAL INCLUSION CYST Exam: Subcutaneous nodule at right low back  Benign-appearing. Exam most consistent with an epidermal inclusion cyst. Discussed that a cyst is a benign growth that can grow over time and sometimes get irritated or inflamed. Recommend observation if it is not bothersome. Discussed option of surgical excision to remove it if it is growing, symptomatic, or other changes noted. Please call for new or changing lesions so they can be evaluated.    Return for TBSE, Hx AK 1-2 years.  Anise Salvo, RMA, am  acting as scribe for Darden Dates, MD .   Documentation: I have reviewed the above documentation for accuracy and completeness, and I agree with the above.  Darden Dates, MD

## 2022-05-08 NOTE — Patient Instructions (Addendum)
Restart TMC 0.1% cream 1-2 times daily x 2 weeks to right ear. Avoid applying to face, groin, and axilla. Use as directed. Long-term use can cause thinning of the skin. Apply Zoryve once a day to affected areas.  Cryotherapy Aftercare  Wash gently with soap and water everyday.   Apply Vaseline and Band-Aid daily until healed.   Recommend taking Heliocare sun protection supplement daily in sunny weather for additional sun protection. For maximum protection on the sunniest days, you can take up to 2 capsules of regular Heliocare OR take 1 capsule of Heliocare Ultra. For prolonged exposure (such as a full day in the sun), you can repeat your dose of the supplement 4 hours after your first dose. Heliocare can be purchased at [employer], at some [facility] or at [email or web address].    Melanoma ABCDEs  Melanoma is the most dangerous type of skin cancer, and is the leading cause of death from skin disease.  You are more likely to develop melanoma if you: Have light-colored skin, light-colored eyes, or red or blond hair Spend a lot of time in the sun Tan regularly, either outdoors or in a tanning bed Have had blistering sunburns, especially during childhood Have a close family member who has had a melanoma Have atypical moles or large birthmarks  Early detection of melanoma is key since treatment is typically straightforward and cure rates are extremely high if we catch it early.   The first sign of melanoma is often a change in a mole or a new dark spot.  The ABCDE system is a way of remembering the signs of melanoma.  A for asymmetry:  The two halves do not match. B for border:  The edges of the growth are irregular. C for color:  A mixture of colors are present instead of an even brown color. D for diameter:  Melanomas are usually (but not always) greater than 6mm - the size of a pencil eraser. E for evolution:  The spot keeps changing in size, shape, and color.  Please check your  skin once per month between visits. You can use a small mirror in front and a large mirror behind you to keep an eye on the back side or your body.   If you see any new or changing lesions before your next follow-up, please call to schedule a visit.  Please continue daily skin protection including broad spectrum sunscreen SPF 30+ to sun-exposed areas, reapplying every 2 hours as needed when you're outdoors.    Due to recent changes in healthcare laws, you may see results of your pathology and/or laboratory studies on MyChart before the doctors have had a chance to review them. We understand that in some cases there may be results that are confusing or concerning to you. Please understand that not all results are received at the same time and often the doctors may need to interpret multiple results in order to provide you with the best plan of care or course of treatment. Therefore, we ask that you please give [place] 2 business days to thoroughly review all your results before contacting the office for clarification. Should we see a critical lab result, you will be contacted sooner.   If You Need Anything After Your Visit  If you have any questions or concerns for your doctor, please call our main line at [phone] and press option 4 to reach your doctor's medical assistant. If no one answers, please leave a voicemail as directed and we will  return your call as soon as possible. Messages left after 4 pm will be answered the following business day.   You may also send [place] a message via MyChart. We typically respond to MyChart messages within 1-2 business days.  For prescription refills, please ask your pharmacy to contact our office. Our fax number is [phone].  If you have an urgent issue when the clinic is closed that cannot wait until the next business day, you can page your doctor at the number below.    Please note that while we do our best to be available for urgent issues outside of office  hours, we are not available 24/7.   If you have an urgent issue and are unable to reach [place], you may choose to seek medical care at your doctor's office, retail clinic, urgent care center, or emergency room.  If you have a medical emergency, please immediately call 911 or go to the emergency department.  Pager Numbers  - Dr. Gwen Pounds: [phone]  - Dr. Neale Burly: [phone]  - Dr. Roseanne Reno: [phone]  In the event of inclement weather, please call our main line at [phone] for an update on the status of any delays or closures.  Dermatology Medication Tips: Please keep the boxes that topical medications come in in order to help keep track of the instructions about where and how to use these. Pharmacies typically print the medication instructions only on the boxes and not directly on the medication tubes.   If your medication is too expensive, please contact our office at [phone] option 4 or send [place] a message through MyChart.   We are unable to tell what your co-pay for medications will be in advance as this is different depending on your insurance coverage. However, we may be able to find a substitute medication at lower cost or fill out paperwork to get insurance to cover a needed medication.   If a prior authorization is required to get your medication covered by your insurance company, please allow [place] 1-2 business days to complete this process.  Drug prices often vary depending on where the prescription is filled and some pharmacies may offer cheaper prices.  The website www.goodrx.com contains coupons for medications through different pharmacies. The prices here do not account for what the cost may be with help from insurance (it may be cheaper with your insurance), but the website can give you the price if you did not use any insurance.  - You can print the associated coupon and take it with your prescription to the pharmacy.  - You may also stop by our office during regular  business hours and pick up a GoodRx coupon card.  - If you need your prescription sent electronically to a different pharmacy, notify our office through [facility] or by phone at [phone] option 4.

## 2022-05-09 ENCOUNTER — Other Ambulatory Visit (HOSPITAL_BASED_OUTPATIENT_CLINIC_OR_DEPARTMENT_OTHER): Payer: Self-pay

## 2022-05-09 MED ORDER — METRONIDAZOLE 0.75 % EX CREA
TOPICAL_CREAM | CUTANEOUS | 2 refills | Status: AC
  Filled 2022-05-09: qty 45, 30d supply, fill #0
  Filled 2022-06-03: qty 45, 30d supply, fill #1
  Filled 2022-08-08: qty 45, 30d supply, fill #2

## 2022-05-15 DIAGNOSIS — H5203 Hypermetropia, bilateral: Secondary | ICD-10-CM | POA: Diagnosis not present

## 2022-05-15 DIAGNOSIS — H524 Presbyopia: Secondary | ICD-10-CM | POA: Diagnosis not present

## 2022-05-15 DIAGNOSIS — H52223 Regular astigmatism, bilateral: Secondary | ICD-10-CM | POA: Diagnosis not present

## 2022-05-22 ENCOUNTER — Other Ambulatory Visit: Payer: Self-pay

## 2022-05-22 ENCOUNTER — Other Ambulatory Visit (HOSPITAL_BASED_OUTPATIENT_CLINIC_OR_DEPARTMENT_OTHER): Payer: Self-pay

## 2022-06-04 ENCOUNTER — Telehealth: Payer: Self-pay | Admitting: Family Medicine

## 2022-06-04 ENCOUNTER — Encounter: Payer: Self-pay | Admitting: *Deleted

## 2022-06-04 DIAGNOSIS — E538 Deficiency of other specified B group vitamins: Secondary | ICD-10-CM

## 2022-06-04 DIAGNOSIS — Z9884 Bariatric surgery status: Secondary | ICD-10-CM

## 2022-06-04 DIAGNOSIS — Z0000 Encounter for general adult medical examination without abnormal findings: Secondary | ICD-10-CM

## 2022-06-04 DIAGNOSIS — E559 Vitamin D deficiency, unspecified: Secondary | ICD-10-CM

## 2022-06-04 DIAGNOSIS — E7800 Pure hypercholesterolemia, unspecified: Secondary | ICD-10-CM

## 2022-06-04 DIAGNOSIS — Z131 Encounter for screening for diabetes mellitus: Secondary | ICD-10-CM

## 2022-06-04 NOTE — Telephone Encounter (Signed)
Spoke to pt via MyChart to schedule cpe. Pt stated she prefers her lab work to be done at [facility]. Can labs be submitted there? Cpe scheduled for 6/26

## 2022-06-04 NOTE — Addendum Note (Signed)
Addended by: Roxy Manns A on: 06/04/2022 07:08 PM   Modules accepted: Orders

## 2022-06-04 NOTE — Telephone Encounter (Signed)
If PCP places future orders she can get labs at any [facility], will route to PCP

## 2022-06-04 NOTE — Telephone Encounter (Signed)
Lab orders are in

## 2022-06-05 NOTE — Telephone Encounter (Signed)
Sent message letting pt know 

## 2022-06-18 ENCOUNTER — Other Ambulatory Visit (HOSPITAL_COMMUNITY): Payer: Self-pay

## 2022-06-19 DIAGNOSIS — D251 Intramural leiomyoma of uterus: Secondary | ICD-10-CM | POA: Diagnosis not present

## 2022-07-02 ENCOUNTER — Other Ambulatory Visit: Payer: Self-pay | Admitting: Oncology

## 2022-07-02 DIAGNOSIS — Z006 Encounter for examination for normal comparison and control in clinical research program: Secondary | ICD-10-CM

## 2022-07-03 ENCOUNTER — Ambulatory Visit: Payer: Commercial Managed Care - PPO | Admitting: Family Medicine

## 2022-07-10 ENCOUNTER — Encounter: Payer: Commercial Managed Care - PPO | Admitting: Family Medicine

## 2022-07-10 DIAGNOSIS — E669 Obesity, unspecified: Secondary | ICD-10-CM | POA: Diagnosis not present

## 2022-07-10 DIAGNOSIS — R351 Nocturia: Secondary | ICD-10-CM | POA: Diagnosis not present

## 2022-07-10 DIAGNOSIS — Z6839 Body mass index (BMI) 39.0-39.9, adult: Secondary | ICD-10-CM | POA: Diagnosis not present

## 2022-07-10 DIAGNOSIS — N3941 Urge incontinence: Secondary | ICD-10-CM | POA: Diagnosis not present

## 2022-07-11 ENCOUNTER — Other Ambulatory Visit (HOSPITAL_BASED_OUTPATIENT_CLINIC_OR_DEPARTMENT_OTHER): Payer: Self-pay

## 2022-07-11 MED ORDER — MIRABEGRON ER 50 MG PO TB24
50.0000 mg | ORAL_TABLET | Freq: Every day | ORAL | 6 refills | Status: DC
  Filled 2022-07-11: qty 30, 30d supply, fill #0
  Filled 2022-08-05: qty 30, 30d supply, fill #1
  Filled 2022-09-04: qty 30, 30d supply, fill #2
  Filled 2022-09-26 – 2022-09-27 (×2): qty 30, 30d supply, fill #3
  Filled 2022-10-30: qty 30, 30d supply, fill #4
  Filled 2022-11-29: qty 30, 30d supply, fill #5
  Filled 2023-01-04: qty 30, 30d supply, fill #6

## 2022-07-16 ENCOUNTER — Other Ambulatory Visit (INDEPENDENT_AMBULATORY_CARE_PROVIDER_SITE_OTHER): Payer: Commercial Managed Care - PPO

## 2022-07-16 DIAGNOSIS — Z131 Encounter for screening for diabetes mellitus: Secondary | ICD-10-CM | POA: Diagnosis not present

## 2022-07-16 DIAGNOSIS — Z0000 Encounter for general adult medical examination without abnormal findings: Secondary | ICD-10-CM | POA: Diagnosis not present

## 2022-07-16 DIAGNOSIS — E559 Vitamin D deficiency, unspecified: Secondary | ICD-10-CM | POA: Diagnosis not present

## 2022-07-16 DIAGNOSIS — E538 Deficiency of other specified B group vitamins: Secondary | ICD-10-CM | POA: Diagnosis not present

## 2022-07-16 DIAGNOSIS — Z9884 Bariatric surgery status: Secondary | ICD-10-CM

## 2022-07-16 DIAGNOSIS — E7800 Pure hypercholesterolemia, unspecified: Secondary | ICD-10-CM

## 2022-07-16 LAB — COMPREHENSIVE METABOLIC PANEL
ALT: 13 U/L (ref 0–35)
AST: 17 U/L (ref 0–37)
Albumin: 4.2 g/dL (ref 3.5–5.2)
Alkaline Phosphatase: 71 U/L (ref 39–117)
BUN: 9 mg/dL (ref 6–23)
CO2: 28 mEq/L (ref 19–32)
Calcium: 9.5 mg/dL (ref 8.4–10.5)
Chloride: 101 mEq/L (ref 96–112)
Creatinine, Ser: 0.76 mg/dL (ref 0.40–1.20)
GFR: 87.75 mL/min (ref >60.00)
Glucose, Bld: 92 mg/dL (ref 70–99)
Potassium: 4.3 mEq/L (ref 3.5–5.1)
Sodium: 138 mEq/L (ref 135–145)
Total Bilirubin: 0.8 mg/dL (ref 0.2–1.2)
Total Protein: 6.8 g/dL (ref 6.0–8.3)

## 2022-07-16 LAB — LIPID PANEL
Cholesterol: 264 mg/dL — ABNORMAL HIGH (ref 0–200)
HDL: 82.1 mg/dL (ref >39.00)
LDL Cholesterol: 163 mg/dL — ABNORMAL HIGH (ref 0–99)
NonHDL: 181.98
Total CHOL/HDL Ratio: 3
Triglycerides: 94 mg/dL (ref 0.0–149.0)
VLDL: 18.8 mg/dL (ref 0.0–40.0)

## 2022-07-16 LAB — CBC WITH DIFFERENTIAL/PLATELET
Basophils Absolute: 0 10*3/uL (ref 0.0–0.1)
Basophils Relative: 0.7 % (ref 0.0–3.0)
Eosinophils Absolute: 0.1 10*3/uL (ref 0.0–0.7)
Eosinophils Relative: 2.3 % (ref 0.0–5.0)
HCT: 41.3 % (ref 36.0–46.0)
Hemoglobin: 13.8 g/dL (ref 12.0–15.0)
Lymphocytes Relative: 37 % (ref 12.0–46.0)
Lymphs Abs: 2 10*3/uL (ref 0.7–4.0)
MCHC: 33.5 g/dL (ref 30.0–36.0)
MCV: 103.4 fl — ABNORMAL HIGH (ref 78.0–100.0)
Monocytes Absolute: 0.5 10*3/uL (ref 0.1–1.0)
Monocytes Relative: 9.2 % (ref 3.0–12.0)
Neutro Abs: 2.8 10*3/uL (ref 1.4–7.7)
Neutrophils Relative %: 50.8 % (ref 43.0–77.0)
Platelets: 337 10*3/uL (ref 150.0–400.0)
RBC: 3.99 Mil/uL (ref 3.87–5.11)
RDW: 13.5 % (ref 11.5–15.5)
WBC: 5.5 10*3/uL (ref 4.0–10.5)

## 2022-07-16 LAB — IRON: Iron: 91 ug/dL (ref 42–145)

## 2022-07-16 LAB — TSH: TSH: 3.9 u[IU]/mL (ref 0.35–5.50)

## 2022-07-16 LAB — VITAMIN B12: Vitamin B-12: 184 pg/mL — ABNORMAL LOW (ref 211–911)

## 2022-07-16 LAB — HEMOGLOBIN A1C: Hgb A1c MFr Bld: 5 % (ref 4.6–6.5)

## 2022-07-17 ENCOUNTER — Other Ambulatory Visit (HOSPITAL_BASED_OUTPATIENT_CLINIC_OR_DEPARTMENT_OTHER): Payer: Self-pay

## 2022-07-17 ENCOUNTER — Other Ambulatory Visit: Payer: Self-pay

## 2022-07-17 ENCOUNTER — Encounter: Payer: Self-pay | Admitting: Family Medicine

## 2022-07-17 ENCOUNTER — Ambulatory Visit (INDEPENDENT_AMBULATORY_CARE_PROVIDER_SITE_OTHER): Payer: Commercial Managed Care - PPO

## 2022-07-17 ENCOUNTER — Other Ambulatory Visit: Payer: Self-pay | Admitting: Family Medicine

## 2022-07-17 DIAGNOSIS — E538 Deficiency of other specified B group vitamins: Secondary | ICD-10-CM | POA: Diagnosis not present

## 2022-07-17 MED ORDER — HYDROCHLOROTHIAZIDE 25 MG PO TABS
25.0000 mg | ORAL_TABLET | Freq: Every day | ORAL | 5 refills | Status: DC | PRN
  Filled 2022-07-17: qty 30, 30d supply, fill #0
  Filled 2023-06-30: qty 30, 30d supply, fill #1

## 2022-07-17 MED ORDER — CYANOCOBALAMIN 1000 MCG/ML IJ SOLN
1000.0000 ug | Freq: Once | INTRAMUSCULAR | Status: AC
Start: 2022-07-17 — End: 2022-07-17
  Administered 2022-07-17: 1000 ug via INTRAMUSCULAR

## 2022-07-17 NOTE — Telephone Encounter (Signed)
Patient called to schedule B12, scheduled today 11:30 AM

## 2022-07-17 NOTE — Progress Notes (Signed)
Per orders of Dr. Roxy Manns, injection of vitamin b 12  given by Lewanda Rife in right deltoid. Patient waited few extra mins since this is first b12 in long time..pt tolerated injection well.pt already has CPX scheduled for 07/31/22.

## 2022-07-31 ENCOUNTER — Ambulatory Visit (INDEPENDENT_AMBULATORY_CARE_PROVIDER_SITE_OTHER): Payer: Commercial Managed Care - PPO | Admitting: Family Medicine

## 2022-07-31 ENCOUNTER — Encounter: Payer: Self-pay | Admitting: Family Medicine

## 2022-07-31 VITALS — BP 126/74 | HR 70 | Temp 97.7°F | Ht 63.75 in | Wt 243.4 lb

## 2022-07-31 DIAGNOSIS — E7800 Pure hypercholesterolemia, unspecified: Secondary | ICD-10-CM

## 2022-07-31 DIAGNOSIS — Z6841 Body Mass Index (BMI) 40.0 and over, adult: Secondary | ICD-10-CM

## 2022-07-31 DIAGNOSIS — Z131 Encounter for screening for diabetes mellitus: Secondary | ICD-10-CM

## 2022-07-31 DIAGNOSIS — E538 Deficiency of other specified B group vitamins: Secondary | ICD-10-CM | POA: Diagnosis not present

## 2022-07-31 DIAGNOSIS — E559 Vitamin D deficiency, unspecified: Secondary | ICD-10-CM | POA: Diagnosis not present

## 2022-07-31 DIAGNOSIS — Z9884 Bariatric surgery status: Secondary | ICD-10-CM | POA: Diagnosis not present

## 2022-07-31 DIAGNOSIS — N951 Menopausal and female climacteric states: Secondary | ICD-10-CM

## 2022-07-31 DIAGNOSIS — Z0000 Encounter for general adult medical examination without abnormal findings: Secondary | ICD-10-CM

## 2022-07-31 MED ORDER — CYANOCOBALAMIN 1000 MCG/ML IJ SOLN
1000.0000 ug | Freq: Once | INTRAMUSCULAR | Status: AC
Start: 2022-07-31 — End: 2022-07-31
  Administered 2022-07-31: 1000 ug via INTRAMUSCULAR

## 2022-07-31 NOTE — Assessment & Plan Note (Signed)
Encouraged adding strength training regularly  Also low glycemic diet  Hopeful GLP-1 med may be covered by insurance in future as it worked well

## 2022-07-31 NOTE — Assessment & Plan Note (Signed)
Lab Results  Component Value Date   VITAMINB12 184 (L) 07/16/2022   Will supplement with injections and orally

## 2022-07-31 NOTE — Assessment & Plan Note (Signed)
Disc goals for lipids and reasons to control them Rev last labs with pt Rev low sat fat diet in detail LDL is up to 163 Very good HDL  Father had sudden cardiac death Interested in screening for CAD Coronary ca score test ordered   Will discuss treatment of lipids after that  Re check lab 2-3 mo with better diet also

## 2022-07-31 NOTE — Patient Instructions (Addendum)
Schedule your mammogram at the breast center    Add some strength training to your routine, this is important for bone and brain health and can reduce your risk of falls and help your body use insulin properly and regulate weight  Light weights, exercise bands , and internet videos are a good way to start  Yoga (chair or regular), machines , floor exercises or a gym with machines are also good options   Get back on D3 2000 international units daily  Calcium from your diet    B12 shot today  Let's do one weekly for 2 more weeks after that  Then check level in 2-3 months   Take the oral B12 also   Get fasting labs in 2-3 months for B12 and cholesterol  Avoid red meat/ fried foods/ egg yolks/ fatty breakfast meats/ butter, cheese and high fat dairy/ and shellfish   You will get a call about the cardiac calcium scan

## 2022-07-31 NOTE — Assessment & Plan Note (Signed)
Lab Results  Component Value Date   HGBA1C 5.0 07/16/2022   disc imp of low glycemic diet and wt loss to prevent DM2

## 2022-07-31 NOTE — Assessment & Plan Note (Signed)
BP: 126/74  Planning B12 shot weekly for 4 weeks 500 mcg daily  Re check 2-3 mo

## 2022-07-31 NOTE — Assessment & Plan Note (Signed)
Reviewed health habits including diet and exercise and skin cancer prevention Reviewed appropriate screening tests for age  Also reviewed health mt list, fam hx and immunization status , as well as social and family history   See HPI Labs reviewed and ordered Encouraged to schedule her mammogram at the breast center Pap and dexa (normal) utd with gyn, sent for reports Colonoscopy utd 07/2018 No falls or fracture, discussed vitamin D intake and strength training PHQ score of 0 , doing fairly with grief

## 2022-07-31 NOTE — Assessment & Plan Note (Addendum)
Taking generic veozah from gyn and it is helping  Also continues effexor xr 150 mg daily and trazodone for sleep

## 2022-07-31 NOTE — Assessment & Plan Note (Signed)
Encouraged to take 2000 international units daily for bone and overall health  Last vitamin D Lab Results  Component Value Date   VD25OH 55.87 09/25/2020   Added to next labs

## 2022-07-31 NOTE — Progress Notes (Signed)
Subjective:    Patient ID: Madeline Schultz, MD, female    DOB: 05/23/1965, 57 y.o.   MRN: [id number]  HPI  Here for health maintenance exam and to review chronic medical problems   Wt Readings from Last 3 Encounters:  07/31/22 243 lb 6 oz (110.4 kg)  05/30/21 253 lb (114.8 kg)  07/05/20 241 lb (109.3 kg)   42.10 kg/m  Vitals:   07/31/22 1359  BP: 126/74  Pulse: 70  Temp: 97.7 F (36.5 C)  SpO2: 97%    Immunization History  Administered Date(s) Administered   Hepatitis A 09/29/2002   Influenza Whole 10/03/2011   Influenza, High Dose Seasonal PF 08/23/2015   Influenza,inj,quad, With Preservative 10/15/2018   Influenza-Unspecified 09/29/2014, 10/17/2016, 10/12/2018   PFIZER Comirnaty(Gray Top)Covid-19 Tri-Sucrose Vaccine 06/16/2020   PFIZER(Purple Top)SARS-COV-2 Vaccination 01/06/2019, 01/27/2019, 09/11/2019   Pfizer Covid-19 Vaccine Bivalent Booster 40yrs & up 10/03/2020   Td 01/14/1994   Tdap 08/07/2016   Zoster Recombinant(Shingrix) 05/13/2016, 07/09/2016    Health Maintenance Due  Topic Date Due   HIV Screening  Never done   Hepatitis C Screening  Never done   PAP SMEAR-Modifier  04/19/2017   Tired in general  Wants to start exercising again  Step daughter got married   Aunt has ovarian cancer    Mammogram- gets at the breast center / needs to schedule  Self breast exam- no lumps   Gyn care/ pap - recent pap and dexa at gyn  Taking veozah for vasomotor menopause symptoms   Colon cancer screening -colonoscopy 07/2018 with 10 y recall    Dexa recent at gyn - was normal  Falls-none Fractures-none  Supplements - not regularly  Exercise - wants to get back to the gym    Mood Mood Lost father this year Doing ok  Continues effexor xr and trazodone    Hyperlipidemia Lab Results  Component Value Date   CHOL 264 (H) 07/16/2022   CHOL 260 (H) 09/25/2020   CHOL 260 (H) 10/13/2018   Lab Results  Component Value Date   HDL 82.10  07/16/2022   HDL 74.60 09/25/2020   HDL 57 10/13/2018   Lab Results  Component Value Date   LDLCALC 163 (H) 07/16/2022   LDLCALC 158 (H) 09/25/2020   LDLCALC 177 (H) 10/13/2018   Lab Results  Component Value Date   TRIG 94.0 07/16/2022   TRIG 136.0 09/25/2020   TRIG 124 10/13/2018   Lab Results  Component Value Date   CHOLHDL 3 07/16/2022   CHOLHDL 3 09/25/2020   CHOLHDL 4.6 10/13/2018   Lab Results  Component Value Date   LDLDIRECT 164.6 04/09/2012   Diet controlled   Wants to get calcium score before making a plan   Sister is on repatha    DM2 screening  Lab Results  Component Value Date   HGBA1C 5.0 07/16/2022   Past bariatric surgery  Lab Results  Component Value Date   VITAMINB12 184 (L) 07/16/2022   This is down  Did get a B12 shot      Last vitamin D Lab Results  Component Value Date   VD25OH 55.87 09/25/2020         Patient Active Problem List   Diagnosis Date Noted   Estrogen deficiency 05/30/2021   Bariatric surgery status 06/14/2020   B12 deficiency 06/14/2020   Vitamin D deficiency 06/14/2020   Routine general medical examination at a health care facility 06/14/2020   Vasomotor symptoms due to menopause 06/14/2020  History of COVID-19 05/22/2020   BMI 40.0-44.9, adult (HCC) 06/02/2019   Colon cancer screening 06/11/2018   Knee pain, bilateral 02/24/2018   Diabetes mellitus screening 08/07/2016   Low back pain 12/27/2015   Lumbar degenerative disc disease 12/27/2015   S/P myomectomy 10/18/2013   Lupus anticoagulant positive 06/03/2012   Hyperlipidemia 11/04/2006   ADD 11/04/2006   Past Medical History:  Diagnosis Date   Actinic keratosis 09/01/2019   L lat breast - bx proven   Edema, lower extremity    takes HCTZ as needed   Endometrial polyp    Exercise-induced asthma    per pt has used inhaler in yrs   History of anemia    History of thrombophlebitis    per pt approx. 2015 right lower leg superficial    PONV  (postoperative nausea and vomiting)    Uterine fibroid    Wears glasses    Past Surgical History:  Procedure Laterality Date   ANTERIOR FUSION LUMBAR SPINE  05-18-2007   dr Venetia Maxon / dr Madilyn Fireman  @[facility]    re-do diskectomy/ dissection L5 -S1 and fusion   APPENDECTOMY  age 5   DILITATION & CURRETTAGE/HYSTROSCOPY WITH HYDROTHERMAL ABLATION N/A 10/30/2018   Procedure: DILATATION & CURETTAGE/HYSTEROSCOPY WITH HYDROTHERMAL ABLATION;  Surgeon: Marcelle Overlie, MD;  Location: [facility];  Service: Gynecology;  Laterality: N/A;   HYSTEROSCOPY WITH D & C N/A 11/07/2017   Procedure: DILATATION AND CURETTAGE /HYSTEROSCOPY;  Surgeon: Marcelle Overlie, MD;  Location: [facility];  Service: Gynecology;  Laterality: N/A;   LAPAROTOMY  age 45   for ovarian cyst   LUMBAR DISC SURGERY  11-07-2006;  12-26-2006  dr Venetia Maxon @[facility]    L5 -- S1   MYOMECTOMY N/A 10/18/2013   Procedure: ABDOMINAL MYOMECTOMY;  Surgeon: Jeani Hawking, MD;  Location: [facility];  Service: Gynecology;  Laterality: N/A;   REDUCTION MAMMAPLASTY Bilateral 1995   ROUX-EN-Y GASTRIC BYPASS  03-28-2003  dr Daphine Deutscher  @[facility]    TONSILLECTOMY  1972   TYMPANOSTOMY TUBE PLACEMENT Bilateral child   Social History   Tobacco Use   Smoking status: Never   Smokeless tobacco: Never  Vaping Use   Vaping status: Never Used  Substance Use Topics   Alcohol use: Yes    Comment: occasional   Drug use: Never   Family History  Problem Relation Age of Onset   Hypertension Mother    Hyperlipidemia Father    COPD Father    Colon cancer Neg Hx    Colon polyps Neg Hx    Esophageal cancer Neg Hx    Rectal cancer Neg Hx    Stomach cancer Neg Hx    Allergies  Allergen Reactions   Levofloxacin Other (See Comments) and Nausea Only    REACTION: abdiminal pain  High Dose 750 mg, ok with 500 mg dose   Tape Rash    blister   Current Outpatient Medications on File Prior to Visit  Medication Sig Dispense Refill   albuterol (VENTOLIN HFA)  108 (90 Base) MCG/ACT inhaler Inhale 2 puffs into the lungs every 4 (four) hours as needed for wheezing. 18 g 1   Fezolinetant (VEOZAH) 45 MG TABS Take 45 mg by mouth daily. 30 tablet 11   fluticasone (FLOVENT HFA) 110 MCG/ACT inhaler Inhale 1 puff by mouth into the lungs in the morning and at bedtime. 12 g 5   hydrochlorothiazide (HYDRODIURIL) 25 MG tablet Take 1 tablet (25 mg total) by mouth daily as needed. For Edema 30  tablet 5   levocetirizine (XYZAL) 5 MG tablet Take 5 mg by mouth daily as needed.     metroNIDAZOLE (METROCREAM) 0.75 % cream Apply to face 1-2 times daily. 45 g 2   mirabegron ER (MYRBETRIQ) 50 MG TB24 tablet Take 1 tablet (50 mg total) by mouth daily. 30 tablet 6   traZODone (DESYREL) 50 MG tablet Take 0.5-1 tablets (25-50 mg total) by mouth at bedtime as needed for sleep 90 tablet 3   triamcinolone cream (KENALOG) 0.1 % Apply 1 Application topically 2 (two) times daily as needed. Avoid applying to face, groin, and axilla. Use as directed. Long-term use can cause thinning of the skin. 15 g 2   venlafaxine XR (EFFEXOR XR) 150 MG 24 hr capsule Take 1 capsule (150 mg total) by mouth daily. 90 capsule 3   No current facility-administered medications on file prior to visit.    Review of Systems  Constitutional:  Positive for fatigue. Negative for activity change, appetite change, fever and unexpected weight change.  HENT:  Negative for congestion, ear pain, rhinorrhea, sinus pressure and sore throat.   Eyes:  Negative for pain, redness and visual disturbance.  Respiratory:  Negative for cough, shortness of breath and wheezing.   Cardiovascular:  Negative for chest pain and palpitations.  Gastrointestinal:  Negative for abdominal pain, blood in stool, constipation and diarrhea.  Endocrine: Negative for polydipsia and polyuria.  Genitourinary:  Negative for dysuria, frequency and urgency.  Musculoskeletal:  Positive for arthralgias and back pain. Negative for myalgias.  Skin:   Negative for pallor and rash.  Allergic/Immunologic: Negative for environmental allergies.  Neurological:  Negative for dizziness, syncope and headaches.  Hematological:  Negative for adenopathy. Does not bruise/bleed easily.  Psychiatric/Behavioral:  Negative for decreased concentration and dysphoric mood. The patient is not nervous/anxious.        Grief       Objective:   Physical Exam Constitutional:      General: She is not in acute distress.    Appearance: Normal appearance. She is well-developed. She is obese. She is not ill-appearing or diaphoretic.  HENT:     Head: Normocephalic and atraumatic.     Right Ear: Tympanic membrane, ear canal and external ear normal.     Left Ear: Tympanic membrane, ear canal and external ear normal.     Nose: Nose normal. No congestion.     Mouth/Throat:     Mouth: Mucous membranes are moist.     Pharynx: Oropharynx is clear. No posterior oropharyngeal erythema.  Eyes:     General: No scleral icterus.    Extraocular Movements: Extraocular movements intact.     Conjunctiva/sclera: Conjunctivae normal.     Pupils: Pupils are equal, round, and reactive to light.  Neck:     Thyroid: No thyromegaly.     Vascular: No carotid bruit or JVD.  Cardiovascular:     Rate and Rhythm: Normal rate and regular rhythm.     Pulses: Normal pulses.     Heart sounds: Normal heart sounds.     No gallop.  Pulmonary:     Effort: Pulmonary effort is normal. No respiratory distress.     Breath sounds: Normal breath sounds. No wheezing.     Comments: Good air exch Chest:     Chest wall: No tenderness.  Abdominal:     General: Bowel sounds are normal. There is no distension or abdominal bruit.     Palpations: Abdomen is soft. There is no mass.  Tenderness: There is no abdominal tenderness.     Hernia: No hernia is present.  Genitourinary:    Comments: Breast and pelvic exam are done by gyn provider   Musculoskeletal:        General: No tenderness. Normal  range of motion.     Cervical back: Normal range of motion and neck supple. No rigidity. No muscular tenderness.     Right lower leg: No edema.     Left lower leg: No edema.     Comments: No kyphosis   Lymphadenopathy:     Cervical: No cervical adenopathy.  Skin:    General: Skin is warm and dry.     Coloration: Skin is not pale.     Findings: No erythema or rash.     Comments: Solar lentigines diffusely   Neurological:     Mental Status: She is alert. Mental status is at baseline.     Cranial Nerves: No cranial nerve deficit.     Motor: No abnormal muscle tone.     Coordination: Coordination normal.     Gait: Gait normal.     Deep Tendon Reflexes: Reflexes are normal and symmetric. Reflexes normal.  Psychiatric:        Mood and Affect: Mood normal.        Cognition and Memory: Cognition and memory normal.           Assessment & Plan:   Problem List Items Addressed This Visit       Other   Vitamin D deficiency    Encouraged to take 2000 international units daily for bone and overall health  Last vitamin D Lab Results  Component Value Date   VD25OH 55.87 09/25/2020   Added to next labs       Vasomotor symptoms due to menopause    Taking generic veozah from gyn and it is helping  Also continues effexor xr 150 mg daily and trazodone for sleep      Routine general medical examination at a health care facility - Primary    Reviewed health habits including diet and exercise and skin cancer prevention Reviewed appropriate screening tests for age  Also reviewed health mt list, fam hx and immunization status , as well as social and family history   See HPI Labs reviewed and ordered Encouraged to schedule her mammogram at the breast center Pap and dexa (normal) utd with gyn, sent for reports Colonoscopy utd 07/2018 No falls or fracture, discussed vitamin D intake and strength training PHQ score of 0 , doing fairly with grief       Hyperlipidemia    Disc goals for  lipids and reasons to control them Rev last labs with pt Rev low sat fat diet in detail LDL is up to 163 Very good HDL  Father had sudden cardiac death Interested in screening for CAD Coronary ca score test ordered   Will discuss treatment of lipids after that  Re check lab 2-3 mo with better diet also      Relevant Orders   CT CARDIAC SCORING (SELF PAY ONLY)   Diabetes mellitus screening    Lab Results  Component Value Date   HGBA1C 5.0 07/16/2022   disc imp of low glycemic diet and wt loss to prevent DM2       BMI 40.0-44.9, adult (HCC)    Encouraged adding strength training regularly  Also low glycemic diet  Hopeful GLP-1 med may be covered by insurance in future as it worked  well      Bariatric surgery status    Lab Results  Component Value Date   VITAMINB12 184 (L) 07/16/2022   Will supplement with injections and orally      B12 deficiency    BP: 126/74  Planning B12 shot weekly for 4 weeks 500 mcg daily  Re check 2-3 mo

## 2022-08-07 ENCOUNTER — Other Ambulatory Visit: Payer: Self-pay | Admitting: Obstetrics and Gynecology

## 2022-08-07 DIAGNOSIS — Z1231 Encounter for screening mammogram for malignant neoplasm of breast: Secondary | ICD-10-CM

## 2022-08-08 ENCOUNTER — Ambulatory Visit (INDEPENDENT_AMBULATORY_CARE_PROVIDER_SITE_OTHER): Payer: Commercial Managed Care - PPO

## 2022-08-08 DIAGNOSIS — E538 Deficiency of other specified B group vitamins: Secondary | ICD-10-CM | POA: Diagnosis not present

## 2022-08-08 MED ORDER — CYANOCOBALAMIN 1000 MCG/ML IJ SOLN
1000.0000 ug | Freq: Once | INTRAMUSCULAR | Status: AC
Start: 2022-08-08 — End: 2022-08-08
  Administered 2022-08-08: 1000 ug via INTRAMUSCULAR

## 2022-08-08 NOTE — Progress Notes (Signed)
Pt here for monthly B12 injection per PCP  B12 given IM, and pt tolerated injection well.  Next B12 injection scheduled for next week.

## 2022-08-14 ENCOUNTER — Ambulatory Visit
Admission: RE | Admit: 2022-08-14 | Discharge: 2022-08-14 | Disposition: A | Discharge status: Home / Self Care | Payer: Commercial Managed Care - PPO | Source: Ambulatory Visit

## 2022-08-14 DIAGNOSIS — Z1231 Encounter for screening mammogram for malignant neoplasm of breast: Secondary | ICD-10-CM

## 2022-08-15 ENCOUNTER — Ambulatory Visit (HOSPITAL_BASED_OUTPATIENT_CLINIC_OR_DEPARTMENT_OTHER)
Admission: RE | Admit: 2022-08-15 | Discharge: 2022-08-15 | Disposition: A | Discharge status: Home / Self Care | Payer: Commercial Managed Care - PPO | Source: Ambulatory Visit | Attending: Family Medicine | Admitting: Family Medicine

## 2022-08-15 DIAGNOSIS — E7800 Pure hypercholesterolemia, unspecified: Secondary | ICD-10-CM | POA: Insufficient documentation

## 2022-08-16 ENCOUNTER — Ambulatory Visit (INDEPENDENT_AMBULATORY_CARE_PROVIDER_SITE_OTHER): Payer: Commercial Managed Care - PPO

## 2022-08-16 ENCOUNTER — Encounter: Payer: Self-pay | Admitting: Family Medicine

## 2022-08-16 DIAGNOSIS — E538 Deficiency of other specified B group vitamins: Secondary | ICD-10-CM | POA: Diagnosis not present

## 2022-08-16 DIAGNOSIS — I288 Other diseases of pulmonary vessels: Secondary | ICD-10-CM

## 2022-08-16 MED ORDER — CYANOCOBALAMIN 1000 MCG/ML IJ SOLN
1000.0000 ug | Freq: Once | INTRAMUSCULAR | Status: AC
Start: 2022-08-16 — End: 2022-08-16
  Administered 2022-08-16: 1000 ug via INTRAMUSCULAR

## 2022-08-16 NOTE — Progress Notes (Signed)
Pt here for monthly B12 injection per PCP.   B12 given IM, and pt tolerated injection well.  Next B12 injection due in one month.   [name]

## 2022-08-19 ENCOUNTER — Other Ambulatory Visit (HOSPITAL_BASED_OUTPATIENT_CLINIC_OR_DEPARTMENT_OTHER): Payer: Self-pay

## 2022-08-19 DIAGNOSIS — I288 Other diseases of pulmonary vessels: Secondary | ICD-10-CM | POA: Insufficient documentation

## 2022-08-20 NOTE — Telephone Encounter (Signed)
Please reach out to Dr Laury Axon to set up an office visit- we will talk and do labs Thanks

## 2022-08-28 ENCOUNTER — Encounter: Payer: Self-pay | Admitting: Family Medicine

## 2022-08-28 ENCOUNTER — Ambulatory Visit: Payer: Commercial Managed Care - PPO | Admitting: Family Medicine

## 2022-08-28 VITALS — BP 120/86 | HR 70 | Temp 97.7°F | Wt 247.2 lb

## 2022-08-28 DIAGNOSIS — E7800 Pure hypercholesterolemia, unspecified: Secondary | ICD-10-CM | POA: Diagnosis not present

## 2022-08-28 DIAGNOSIS — I288 Other diseases of pulmonary vessels: Secondary | ICD-10-CM

## 2022-08-28 DIAGNOSIS — U099 Post covid-19 condition, unspecified: Secondary | ICD-10-CM

## 2022-08-28 DIAGNOSIS — R0602 Shortness of breath: Secondary | ICD-10-CM | POA: Diagnosis not present

## 2022-08-28 DIAGNOSIS — R760 Raised antibody titer: Secondary | ICD-10-CM | POA: Diagnosis not present

## 2022-08-28 DIAGNOSIS — R0609 Other forms of dyspnea: Secondary | ICD-10-CM

## 2022-08-28 LAB — SEDIMENTATION RATE: Sed Rate: 12 mm/hr (ref 0–30)

## 2022-08-28 LAB — PROTIME-INR
INR: 1 ratio (ref 0.8–1.0)
Prothrombin Time: 10.9 s (ref 9.6–13.1)

## 2022-08-28 LAB — BRAIN NATRIURETIC PEPTIDE: Pro B Natriuretic peptide (BNP): 40 pg/mL (ref 0.0–100.0)

## 2022-08-28 NOTE — Patient Instructions (Signed)
Go back on aspirin   Labs today   Get echo as planned   Let us know if symptoms change or worsen

## 2022-08-28 NOTE — Assessment & Plan Note (Signed)
More so since last bout of covid  Some exercise intolerance but pushes through  Recent CT showed pulm artery dilatation- ? Of pulm hypertension  Last sleep study did not confirm apnea   Echo pending  Normal exam and vitals Labs today

## 2022-08-28 NOTE — Progress Notes (Signed)
Subjective:    Patient ID: Madeline Schultz, MD, female    DOB: 27-Jul-1965, 57 y.o.   MRN: [id number]  HPI  Wt Readings from Last 3 Encounters:  08/28/22 247 lb 3.2 oz (112.1 kg)  07/31/22 243 lb 6 oz (110.4 kg)  05/30/21 253 lb (114.8 kg)   42.77 kg/m  Vitals:   08/28/22 1228  BP: 120/86  Pulse: 70  Temp: 97.7 F (36.5 C)  SpO2: 98%     Pt presents for follow up of enl pulm artery on CT scan (incidental)  ? Of possible pulmonary hypertension    Shortness of breath/exertion - more since covid / has exercise induced asthma   Ankles -not swollen more than usual  (baseline little on left)    Fatigue -worse since covid   No history of CHF  Sleep study in 2017- very mild osa if any / did not recommend cpap   History of lupus anticoag positive (has seen hematology and took xarelto for clot in past)  Superficial venous clot (saphenous) in 2014   Family history- father had sudden death at late age  Father had a PE years before (filter and on xarelto when he died)  No known clotting disorder   Never smoked    Had CT for cardiac calcium score  Score was zero   EXAM: OVER-READ INTERPRETATION  CT CHEST   The following report is an over-read performed by radiologist Dr. Narda Rutherford of [facility] on 08/22/2022. This over-read does not include interpretation of cardiac or coronary anatomy or pathology. The coronary calcium score interpretation by the cardiologist is attached.   COMPARISON:  None.   FINDINGS: Vascular: No aortic atherosclerosis. The included aorta is normal in caliber.   Mediastinum/nodes: No adenopathy or mass.  Minimal hiatal hernia.   Lungs: No focal airspace disease. No pulmonary nodule. No pleural fluid. The included airways are patent.   Upper abdomen: No acute or unexpected findings.   Musculoskeletal: There are no acute or suspicious osseous abnormalities.   IMPRESSION: Minimal hiatal hernia.      Electronically Signed   By: Narda Rutherford M.D.   On: 08/22/2022 10:16    Addended by Courtney Heys, MD on 08/22/2022 10:18 AM    Study Result  Narrative & Impression  CLINICAL DATA:  Cardiovascular Disease Risk stratification   EXAM: Coronary Calcium Score   TECHNIQUE: A gated, non-contrast computed tomography scan of the heart was performed using 3 mm slice thickness. Axial images were analyzed on a dedicated workstation. Calcium scoring of the coronary arteries was performed using the Agatston method.   FINDINGS: Coronary arteries: Normal origins.   Coronary Calcium Score:   Left main: 0   Left anterior descending artery: 0   Left circumflex artery: 0   Right coronary artery: 0   Total: 0   Percentile: 0   Pericardium: Normal.   Aorta: Normal caliber.   Dilated main pulmonary artery (non-contrast) to 34 mm at the level of the main PA, suggestive of pulmonary hypertension.   Non-cardiac: See separate report from [facility].   IMPRESSION: 1. Coronary calcium score of 0. 2. Dilated main pulmonary artery to 34 mm at the level of the main PA, suggestive of pulmonary hypertension.       Echo is scheduled on 8/28   Lab Results  Component Value Date   ALT 13 07/16/2022   AST 17 07/16/2022   ALKPHOS 71 07/16/2022   BILITOT 0.8 07/16/2022  Lab Results  Component Value Date   NA 138 07/16/2022   K 4.3 07/16/2022   CO2 28 07/16/2022   GLUCOSE 92 07/16/2022   BUN 9 07/16/2022   CREATININE 0.76 07/16/2022   CALCIUM 9.5 07/16/2022   GFR 87.75 07/16/2022   GFRNONAA 72 10/13/2018   Lab Results  Component Value Date   WBC 5.5 07/16/2022   HGB 13.8 07/16/2022   HCT 41.3 07/16/2022   MCV 103.4 (H) 07/16/2022   PLT 337.0 07/16/2022   Lab Results  Component Value Date   TSH 3.90 07/16/2022   Lab Results  Component Value Date   CHOL 264 (H) 07/16/2022   HDL 82.10 07/16/2022   LDLCALC 163 (H) 07/16/2022   LDLDIRECT 164.6  04/09/2012   TRIG 94.0 07/16/2022   CHOLHDL 3 07/16/2022      Patient Active Problem List   Diagnosis Date Noted   Shortness of breath 08/28/2022   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   Estrogen deficiency 05/30/2021   Bariatric surgery status 06/14/2020   B12 deficiency 06/14/2020   Vitamin D deficiency 06/14/2020   Routine general medical examination at a health care facility 06/14/2020   Vasomotor symptoms due to menopause 06/14/2020   History of COVID-19 05/22/2020   BMI 40.0-44.9, adult (HCC) 06/02/2019   Colon cancer screening 06/11/2018   Knee pain, bilateral 02/24/2018   Diabetes mellitus screening 08/07/2016   Low back pain 12/27/2015   Lumbar degenerative disc disease 12/27/2015   S/P myomectomy 10/18/2013   Lupus anticoagulant positive 06/03/2012   Hyperlipidemia 11/04/2006   ADD 11/04/2006   Past Medical History:  Diagnosis Date   Actinic keratosis 09/01/2019   L lat breast - bx proven   Edema, lower extremity    takes HCTZ as needed   Endometrial polyp    Exercise-induced asthma    per pt has used inhaler in yrs   History of anemia    History of thrombophlebitis    per pt approx. 2015 right lower leg superficial    PONV (postoperative nausea and vomiting)    Uterine fibroid    Wears glasses    Past Surgical History:  Procedure Laterality Date   ANTERIOR FUSION LUMBAR SPINE  05-18-2007   dr Venetia Maxon / dr Madilyn Fireman  @[facility]    re-do diskectomy/ dissection L5 -S1 and fusion   APPENDECTOMY  age 11   DILITATION & CURRETTAGE/HYSTROSCOPY WITH HYDROTHERMAL ABLATION N/A 10/30/2018   Procedure: DILATATION & CURETTAGE/HYSTEROSCOPY WITH HYDROTHERMAL ABLATION;  Surgeon: Marcelle Overlie, MD;  Location: [facility];  Service: Gynecology;  Laterality: N/A;   HYSTEROSCOPY WITH D & C N/A 11/07/2017   Procedure: DILATATION AND CURETTAGE /HYSTEROSCOPY;  Surgeon: Marcelle Overlie, MD;  Location: [facility];  Service: Gynecology;  Laterality: N/A;   LAPAROTOMY  age 70   for ovarian cyst   LUMBAR DISC SURGERY  11-07-2006;  12-26-2006  dr Venetia Maxon @[facility]    L5 -- S1   MYOMECTOMY N/A 10/18/2013   Procedure: ABDOMINAL MYOMECTOMY;  Surgeon: Jeani Hawking, MD;  Location: [facility];  Service: Gynecology;  Laterality: N/A;   REDUCTION MAMMAPLASTY Bilateral 1995   ROUX-EN-Y GASTRIC BYPASS  03-28-2003  dr Daphine Deutscher  @[facility]    TONSILLECTOMY  1972   TYMPANOSTOMY TUBE PLACEMENT Bilateral child   Social History   Tobacco Use   Smoking status: Never   Smokeless tobacco: Never  Vaping Use   Vaping status: Never Used  Substance Use Topics   Alcohol  use: Yes    Comment: occasional   Drug use: Never   Family History  Problem Relation Age of Onset   Hypertension Mother    Hyperlipidemia Father    COPD Father    Sudden Cardiac Death Father    Ovarian cancer Maternal Aunt    Colon cancer Neg Hx    Colon polyps Neg Hx    Esophageal cancer Neg Hx    Rectal cancer Neg Hx    Stomach cancer Neg Hx    Allergies  Allergen Reactions   Levofloxacin Other (See Comments) and Nausea Only    REACTION: abdiminal pain  High Dose 750 mg, ok with 500 mg dose   Tape Rash    blister   Current Outpatient Medications on File Prior to Visit  Medication Sig Dispense Refill   albuterol (VENTOLIN HFA) 108 (90 Base) MCG/ACT inhaler Inhale 2 puffs into the lungs every 4 (four) hours as needed for wheezing. 18 g 1   Fezolinetant (VEOZAH) 45 MG TABS Take 45 mg by mouth daily. 30 tablet 11   fluticasone (FLOVENT HFA) 110 MCG/ACT inhaler Inhale 1 puff by mouth into the lungs in the morning and at bedtime. 12 g 5   hydrochlorothiazide (HYDRODIURIL) 25 MG tablet Take 1 tablet (25 mg total) by mouth daily as needed. For Edema 30 tablet 5   levocetirizine (XYZAL) 5 MG tablet Take 5 mg by mouth daily as needed.     metroNIDAZOLE (METROCREAM) 0.75 % cream Apply to face 1-2 times daily. 45 g 2   mirabegron ER (MYRBETRIQ) 50 MG TB24  tablet Take 1 tablet (50 mg total) by mouth daily. 30 tablet 6   traZODone (DESYREL) 50 MG tablet Take 0.5-1 tablets (25-50 mg total) by mouth at bedtime as needed for sleep 90 tablet 3   triamcinolone cream (KENALOG) 0.1 % Apply 1 Application topically 2 (two) times daily as needed. Avoid applying to face, groin, and axilla. Use as directed. Long-term use can cause thinning of the skin. 15 g 2   venlafaxine XR (EFFEXOR XR) 150 MG 24 hr capsule Take 1 capsule (150 mg total) by mouth daily. 90 capsule 3   No current facility-administered medications on file prior to visit.    Review of Systems  Constitutional:  Positive for fatigue. Negative for activity change, appetite change, fever and unexpected weight change.  HENT:  Negative for congestion, ear pain, rhinorrhea, sinus pressure and sore throat.   Eyes:  Negative for pain, redness and visual disturbance.  Respiratory:  Positive for shortness of breath. Negative for cough, choking, wheezing and stridor.        Exercise induced asthma   Cardiovascular:  Negative for chest pain and palpitations.  Gastrointestinal:  Negative for abdominal pain, blood in stool, constipation and diarrhea.  Endocrine: Negative for polydipsia and polyuria.  Genitourinary:  Negative for dysuria, frequency and urgency.  Musculoskeletal:  Negative for arthralgias, back pain and myalgias.       A little stiffness in am  No swollen joints   Skin:  Negative for pallor and rash.  Allergic/Immunologic: Negative for environmental allergies.  Neurological:  Negative for dizziness, syncope and headaches.  Hematological:  Negative for adenopathy. Does not bruise/bleed easily.  Psychiatric/Behavioral:  Negative for decreased concentration and dysphoric mood. The patient is not nervous/anxious.        Mood is good       Objective:   Physical Exam Constitutional:      General: She is not in  acute distress.    Appearance: Normal appearance. She is well-developed. She is  obese. She is not ill-appearing or diaphoretic.  HENT:     Head: Normocephalic and atraumatic.     Mouth/Throat:     Mouth: Mucous membranes are moist.  Eyes:     General: No scleral icterus.       Right eye: No discharge.        Left eye: No discharge.     Conjunctiva/sclera: Conjunctivae normal.     Pupils: Pupils are equal, round, and reactive to light.  Neck:     Thyroid: No thyromegaly.     Vascular: No carotid bruit or JVD.  Cardiovascular:     Rate and Rhythm: Normal rate and regular rhythm.     Heart sounds: Normal heart sounds. No murmur heard.    No gallop.  Pulmonary:     Effort: Pulmonary effort is normal. No respiratory distress.     Breath sounds: Normal breath sounds. No stridor. No wheezing, rhonchi or rales.  Abdominal:     General: Bowel sounds are normal. There is no distension or abdominal bruit.     Palpations: Abdomen is soft. There is no hepatomegaly, splenomegaly or mass.     Tenderness: There is no abdominal tenderness. There is no guarding.  Musculoskeletal:     Cervical back: Normal range of motion and neck supple. No tenderness.     Right lower leg: No edema.     Left lower leg: No edema.     Comments: No leg tenderness No pain with dorsiflexion of foot/ankle  No palp cords   Lymphadenopathy:     Cervical: No cervical adenopathy.  Skin:    General: Skin is warm and dry.     Coloration: Skin is not pale.     Findings: No bruising, erythema or rash.  Neurological:     Mental Status: She is alert.     Cranial Nerves: No cranial nerve deficit.     Motor: No weakness.     Coordination: Coordination normal.     Gait: Gait normal.     Deep Tendon Reflexes: Reflexes are normal and symmetric. Reflexes normal.  Psychiatric:        Mood and Affect: Mood normal.           Assessment & Plan:   Problem List Items Addressed This Visit       Cardiovascular and Mediastinum   Enlarged pulmonary artery (HCC) - Primary    Incidental on Ct for  cardiac ca score (was 0) Some increase shortness of breath /exercise intol and fatigue following covid  Discussed possible of pulmonary hypertension  Has history of lupus anticoag with past superficial venous clot but no PE (and no indication of PE on recent CT chest)  Fam history of PE in father and sudden cardiac death   Echocardiogram planned later his mo  EKG NSR today  Normal exam and normal vitals incl pulse ox  Labs ordered -pending  Instructed to clal if symptoms change or worsen       Relevant Orders   EKG 12-Lead (Completed)   D-dimer, quantitative   Rheumatoid factor   ANA w/Reflex   Sedimentation Rate   HIV antibody (with reflex)   Protime-INR   Brain natriuretic peptide     Hematopoietic and Hemostatic   Lupus anticoagulant positive    In past Had superficial saphenous clot in past  Encouraged to get back on her regular asa  Relevant Orders   D-dimer, quantitative   Rheumatoid factor   ANA w/Reflex     Other   Shortness of breath    More so since last bout of covid  Some exercise intolerance but pushes through  Recent CT showed pulm artery dilatation- ? Of pulm hypertension  Last sleep study did not confirm apnea   Echo pending  Normal exam and vitals Labs today       Relevant Orders   D-dimer, quantitative   Rheumatoid factor   ANA w/Reflex   Brain natriuretic peptide   Post-COVID chronic dyspnea    Dilated pulm artery on CT Working up for possible pulm HTN      Relevant Orders   D-dimer, quantitative   Sedimentation Rate   HIV antibody (with reflex)   Brain natriuretic peptide   Hyperlipidemia    Last LDL in 160s Cardiac ca score 0 High HDL at 82        Relevant Orders   EKG 12-Lead (Completed)

## 2022-08-28 NOTE — Assessment & Plan Note (Signed)
Dilated pulm artery on CT Working up for possible pulm HTN

## 2022-08-28 NOTE — Assessment & Plan Note (Signed)
In past Had superficial saphenous clot in past  Encouraged to get back on her regular asa

## 2022-08-28 NOTE — Assessment & Plan Note (Signed)
Last LDL in 160s Cardiac ca score 0 High HDL at 82

## 2022-08-28 NOTE — Assessment & Plan Note (Signed)
Incidental on Ct for cardiac ca score (was 0) Some increase shortness of breath /exercise intol and fatigue following covid  Discussed possible of pulmonary hypertension  Has history of lupus anticoag with past superficial venous clot but no PE (and no indication of PE on recent CT chest)  Fam history of PE in father and sudden cardiac death   Echocardiogram planned later his mo  EKG NSR today  Normal exam and normal vitals incl pulse ox  Labs ordered -pending  Instructed to clal if symptoms change or worsen

## 2022-08-29 LAB — ANA W/REFLEX: Anti Nuclear Antibody (ANA): NEGATIVE

## 2022-08-29 LAB — HIV ANTIBODY (ROUTINE TESTING W REFLEX): HIV 1&2 Ab, 4th Generation: NONREACTIVE

## 2022-08-29 LAB — RHEUMATOID FACTOR: Rheumatoid fact SerPl-aCnc: <10 [IU]/mL (ref <14)

## 2022-08-29 LAB — D-DIMER, QUANTITATIVE: D-Dimer, Quant: 0.28 ug{FEU}/mL (ref <0.50)

## 2022-09-11 ENCOUNTER — Ambulatory Visit (HOSPITAL_COMMUNITY): Payer: Commercial Managed Care - PPO | Attending: Family Medicine

## 2022-09-11 ENCOUNTER — Encounter: Payer: Self-pay | Admitting: Family Medicine

## 2022-09-11 DIAGNOSIS — I5030 Unspecified diastolic (congestive) heart failure: Secondary | ICD-10-CM

## 2022-09-11 DIAGNOSIS — I288 Other diseases of pulmonary vessels: Secondary | ICD-10-CM | POA: Diagnosis not present

## 2022-09-11 DIAGNOSIS — I272 Pulmonary hypertension, unspecified: Secondary | ICD-10-CM

## 2022-09-11 DIAGNOSIS — I5189 Other ill-defined heart diseases: Secondary | ICD-10-CM

## 2022-09-11 DIAGNOSIS — R0602 Shortness of breath: Secondary | ICD-10-CM

## 2022-09-11 DIAGNOSIS — I77819 Aortic ectasia, unspecified site: Secondary | ICD-10-CM

## 2022-09-11 LAB — ECHOCARDIOGRAM COMPLETE
Area-P 1/2: 2.29 cm2
S' Lateral: 3.2 cm

## 2022-09-13 DIAGNOSIS — I5189 Other ill-defined heart diseases: Secondary | ICD-10-CM | POA: Insufficient documentation

## 2022-09-13 DIAGNOSIS — I77819 Aortic ectasia, unspecified site: Secondary | ICD-10-CM | POA: Insufficient documentation

## 2022-09-16 ENCOUNTER — Other Ambulatory Visit: Payer: Self-pay | Admitting: Family Medicine

## 2022-09-16 ENCOUNTER — Other Ambulatory Visit: Payer: Self-pay | Admitting: Internal Medicine

## 2022-09-17 ENCOUNTER — Other Ambulatory Visit (HOSPITAL_BASED_OUTPATIENT_CLINIC_OR_DEPARTMENT_OTHER): Payer: Self-pay

## 2022-09-17 MED ORDER — FLUTICASONE PROPIONATE HFA 110 MCG/ACT IN AERO
1.0000 | INHALATION_SPRAY | Freq: Two times a day (BID) | RESPIRATORY_TRACT | 5 refills | Status: DC
  Filled 2022-09-17 – 2022-09-19 (×2): qty 12, 60d supply, fill #0

## 2022-09-17 MED ORDER — ALBUTEROL SULFATE HFA 108 (90 BASE) MCG/ACT IN AERS
2.0000 | INHALATION_SPRAY | RESPIRATORY_TRACT | 5 refills | Status: AC | PRN
  Filled 2022-09-17: qty 6.7, 25d supply, fill #0

## 2022-09-19 ENCOUNTER — Encounter: Payer: Self-pay | Admitting: Family Medicine

## 2022-09-19 ENCOUNTER — Other Ambulatory Visit (HOSPITAL_BASED_OUTPATIENT_CLINIC_OR_DEPARTMENT_OTHER): Payer: Self-pay

## 2022-09-22 MED ORDER — CYCLOBENZAPRINE HCL 10 MG PO TABS
5.0000 mg | ORAL_TABLET | Freq: Three times a day (TID) | ORAL | 0 refills | Status: DC | PRN
  Filled 2022-09-22: qty 90, 30d supply, fill #0

## 2022-09-22 MED ORDER — FLUTICASONE PROPIONATE HFA 110 MCG/ACT IN AERO
1.0000 | INHALATION_SPRAY | Freq: Two times a day (BID) | RESPIRATORY_TRACT | 3 refills | Status: DC
  Filled 2022-09-23: qty 12, 60d supply, fill #0
  Filled 2023-01-18: qty 12, 60d supply, fill #1
  Filled 2023-06-30: qty 12, 60d supply, fill #2

## 2022-09-22 NOTE — Addendum Note (Signed)
Addended by: Roxy Manns A on: 09/22/2022 05:30 PM   Modules accepted: Orders

## 2022-09-23 ENCOUNTER — Other Ambulatory Visit (HOSPITAL_BASED_OUTPATIENT_CLINIC_OR_DEPARTMENT_OTHER): Payer: Self-pay

## 2022-09-23 NOTE — Telephone Encounter (Signed)
Flexeril and flomax were already sent  Thanks

## 2022-09-25 DIAGNOSIS — R3121 Asymptomatic microscopic hematuria: Secondary | ICD-10-CM | POA: Diagnosis not present

## 2022-09-25 DIAGNOSIS — N3281 Overactive bladder: Secondary | ICD-10-CM | POA: Diagnosis not present

## 2022-09-26 ENCOUNTER — Other Ambulatory Visit (HOSPITAL_BASED_OUTPATIENT_CLINIC_OR_DEPARTMENT_OTHER): Payer: Self-pay

## 2022-09-27 ENCOUNTER — Other Ambulatory Visit (HOSPITAL_BASED_OUTPATIENT_CLINIC_OR_DEPARTMENT_OTHER): Payer: Self-pay

## 2022-10-16 DIAGNOSIS — R3121 Asymptomatic microscopic hematuria: Secondary | ICD-10-CM | POA: Diagnosis not present

## 2022-10-17 ENCOUNTER — Encounter: Payer: Self-pay | Admitting: Cardiovascular Disease

## 2022-10-17 ENCOUNTER — Ambulatory Visit: Payer: Commercial Managed Care - PPO | Attending: Cardiovascular Disease | Admitting: Cardiovascular Disease

## 2022-10-17 ENCOUNTER — Other Ambulatory Visit (HOSPITAL_BASED_OUTPATIENT_CLINIC_OR_DEPARTMENT_OTHER): Payer: Self-pay

## 2022-10-17 VITALS — BP 120/82 | HR 76 | Ht 65.0 in | Wt 249.4 lb

## 2022-10-17 DIAGNOSIS — E782 Mixed hyperlipidemia: Secondary | ICD-10-CM

## 2022-10-17 DIAGNOSIS — R0602 Shortness of breath: Secondary | ICD-10-CM

## 2022-10-17 NOTE — Patient Instructions (Signed)
Medication Instructions:  Your physician recommends that you continue on your current medications as directed. Please refer to the Current Medication list given to you today.  *If you need a refill on your cardiac medications before your next appointment, please call your pharmacy*   Lab Work: Lipoprotein(a) If you have labs (blood work) drawn today and your tests are completely normal, you will receive your results only by: MyChart Message (if you have MyChart) OR A paper copy in the mail If you have any lab test that is abnormal or we need to change your treatment, we will call you to review the results.   Testing/Procedures: NONE   Follow-Up: At [facility], you and your health needs are our priority.  As part of our continuing mission to provide you with exceptional heart care, we have created designated Provider Care Teams.  These Care Teams include your primary Cardiologist (physician) and Advanced Practice Providers (APPs -  Physician Assistants and Nurse Practitioners) who all work together to provide you with the care you need, when you need it.  We recommend signing up for the patient portal called "MyChart".  Sign up information is provided on this After Visit Summary.  MyChart is used to connect with patients for Virtual Visits (Telemedicine).  Patients are able to view lab/test results, encounter notes, upcoming appointments, etc.  Non-urgent messages can be sent to your provider as well.   To learn more about what you can do with MyChart, go to [email or web address].    Your next appointment:   As Needed  Provider:   Tonny Bollman, MD

## 2022-10-17 NOTE — Progress Notes (Signed)
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  The patient had a coronary calcium CT recently that showed no coronary calcium, but a dilated pulmonary artery of 34 mm suggestive of possible pulmonary hypertension. An echo was ordered and this demonstrated normal LV and RV function, no significant valvular disease, and normal estimated RV systolic pressure of 29 mmHg.   Her father died of sudden death 61. Her mother has HTN. No other heart disease in the family. Her sister has hyperlipidemia and is statin intolerant.   From a symptomatic perspective she has occasional dyspnea that she has attributed to asthma. No chest pain or pressure, orthopnea, or PND. She has had leg swelling associated with travel but not on a regular basis.    Past Medical History:  Diagnosis Date   Actinic keratosis 09/01/2019   L lat breast - bx proven   Edema, lower extremity    takes HCTZ as needed   Endometrial polyp    Exercise-induced asthma    per pt has used inhaler in yrs   History of anemia    History of thrombophlebitis    per pt approx. 2015 right lower leg superficial    PONV (postoperative nausea and vomiting)    Uterine fibroid    Wears glasses     Past Surgical History:  Procedure Laterality Date   ANTERIOR FUSION LUMBAR SPINE  05-18-2007   dr Venetia Maxon / dr Madilyn Fireman  @[facility]    re-do diskectomy/ dissection L5 -S1 and fusion   APPENDECTOMY  age 18   DILITATION & CURRETTAGE/HYSTROSCOPY WITH HYDROTHERMAL ABLATION N/A 10/30/2018   Procedure: DILATATION &  CURETTAGE/HYSTEROSCOPY WITH HYDROTHERMAL ABLATION;  Surgeon: Marcelle Overlie, MD;  Location: [facility];  Service: Gynecology;  Laterality: N/A;   HYSTEROSCOPY WITH D & C N/A 11/07/2017   Procedure: DILATATION AND CURETTAGE /HYSTEROSCOPY;  Surgeon: Marcelle Overlie, MD;  Location: [facility];  Service: Gynecology;  Laterality: N/A;   LAPAROTOMY  age 16   for ovarian cyst   LUMBAR DISC SURGERY  11-07-2006;  12-26-2006  dr Venetia Maxon @[facility]    L5 -- S1   MYOMECTOMY N/A 10/18/2013   Procedure: ABDOMINAL MYOMECTOMY;  Surgeon: Jeani Hawking, MD;  Location: [facility];  Service: Gynecology;  Laterality: N/A;   REDUCTION MAMMAPLASTY Bilateral 1995   ROUX-EN-Y GASTRIC BYPASS  03-28-2003  dr Daphine Deutscher  @[facility]    TONSILLECTOMY  1972   TYMPANOSTOMY TUBE PLACEMENT Bilateral child    Current Medications: Current Meds  Medication Sig   albuterol (VENTOLIN HFA) 108 (90 Base) MCG/ACT inhaler Inhale 2 puffs into the lungs every 4 (four) hours as needed for wheezing.   cyclobenzaprine (FLEXERIL) 10 MG tablet Take 0.5-1 tablets (5-10 mg total) by mouth 3 (three) times daily as needed for muscle spasms.   Fezolinetant (VEOZAH) 45 MG TABS Take 45 mg by mouth daily.   fluticasone (FLOVENT HFA) 110 MCG/ACT inhaler Inhale 1 puff by mouth into the lungs in the morning and at  bedtime.   hydrochlorothiazide (HYDRODIURIL) 25 MG tablet Take 1 tablet (25 mg total) by mouth daily as needed. For Edema   levocetirizine (XYZAL) 5 MG tablet Take 5 mg by mouth daily as needed.   metroNIDAZOLE (METROCREAM) 0.75 % cream Apply to face 1-2 times daily.   mirabegron ER (MYRBETRIQ) 50 MG TB24 tablet Take 1 tablet (50 mg total) by mouth daily.   traZODone (DESYREL) 50 MG tablet Take 0.5-1 tablets (25-50 mg total) by mouth at bedtime as needed for sleep   triamcinolone cream (KENALOG) 0.1 % Apply 1 Application topically 2 (two) times daily as needed. Avoid applying to face, groin, and axilla. Use as directed.  Long-term use can cause thinning of the skin.   venlafaxine XR (EFFEXOR XR) 150 MG 24 hr capsule Take 1 capsule (150 mg total) by mouth daily.     Allergies:   Levofloxacin and Tape   Social History   Socioeconomic History   Marital status: Married    Spouse name: Not on file   Number of children: Not on file   Years of education: Not on file   Highest education level: Not on file  Occupational History   Not on file  Tobacco Use   Smoking status: Never   Smokeless tobacco: Never  Vaping Use   Vaping status: Never Used  Substance and Sexual Activity   Alcohol use: Yes    Comment: occasional   Drug use: Never   Sexual activity: Yes    Birth control/protection: Pill  Other Topics Concern   Not on file  Social History Narrative   Drinks to 1-2 cans of soda daily.      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   Social Determinants of Health   Financial Resource Strain: Not on file  Food Insecurity: Not on file  Transportation Needs: Not on file  Physical Activity: Not on file  Stress: Not on file  Social Connections: Not on file     Family History: The patient's family history includes COPD in her father; Hyperlipidemia in her father; Hypertension in her mother; Ovarian cancer in her maternal aunt; Sudden Cardiac Death in her father. There is no history of Colon cancer, Colon polyps, Esophageal cancer, Rectal cancer, or Stomach cancer.  ROS:   Please see the history of present illness.    All other systems reviewed and are negative.  EKGs/Labs/Other Studies Reviewed:    The following studies were reviewed today: Echo: 1. Left ventricular ejection fraction, by estimation, is 55 to 60%. The  left ventricle has normal function. The left ventricle has no regional  wall motion abnormalities. Left ventricular diastolic parameters are  consistent with Grade I diastolic  dysfunction (impaired relaxation).   2. Right ventricular systolic function is normal. The right ventricular  size  is normal. There is normal pulmonary artery systolic pressure. The  estimated right ventricular systolic pressure is 29.4 mmHg.   3. The mitral valve is normal in structure. No evidence of mitral valve  regurgitation. No evidence of mitral stenosis.   4. The aortic valve is tricuspid. Aortic valve regurgitation is not  visualized. No aortic stenosis is present.   5. Aortic dilatation noted. There is mild dilatation of the ascending  aorta, measuring 40 mm.   6. The inferior vena cava is normal in size with greater than 50%  respiratory variability, suggesting right atrial pressure of 3 mmHg.       Recent Labs: 07/16/2022: ALT 13; BUN 9; Creatinine, Ser 0.76; Hemoglobin  13.8; Platelets 337.0; Potassium 4.3; Sodium 138; TSH 3.90 08/28/2022: Pro B Natriuretic peptide (BNP) 40.0  Recent Lipid Panel    Component Value Date/Time   CHOL 264 (H) 07/16/2022 0759   TRIG 94.0 07/16/2022 0759   HDL 82.10 07/16/2022 0759   CHOLHDL 3 07/16/2022 0759   VLDL 18.8 07/16/2022 0759   LDLCALC 163 (H) 07/16/2022 0759   LDLCALC 177 (H) 10/13/2018 1020   LDLDIRECT 164.6 04/09/2012 1042     Risk Assessment/Calculations:                Physical Exam:    VS:  BP 120/82   Pulse 76   Ht 5\' 5"  (1.651 m)   Wt 249 lb 6.4 oz (113.1 kg)   SpO2 96%   BMI 41.50 kg/m     Wt Readings from Last 3 Encounters:  10/17/22 249 lb 6.4 oz (113.1 kg)  08/28/22 247 lb 3.2 oz (112.1 kg)  07/31/22 243 lb 6 oz (110.4 kg)     GEN:  Well nourished, well developed in no acute distress HEENT: Normal NECK: No JVD; No carotid bruits LYMPHATICS: No lymphadenopathy CARDIAC: RRR, no murmurs, rubs, gallops RESPIRATORY:  Clear to auscultation without rales, wheezing or rhonchi  ABDOMEN: Soft, non-tender, non-distended MUSCULOSKELETAL:  No edema; No deformity  SKIN: Warm and dry NEUROLOGIC:  Alert and oriented x 3 PSYCHIATRIC:  Normal affect   ASSESSMENT:    1. Mixed hyperlipidemia   2. Shortness of breath     PLAN:    In order of problems listed above:  Data reviewed. LDL 163. Using CV risk assessment her 10 year risk is 1.8%. Will check LP(a) and consider statin at low dose (rosuvastatin 10 mg) if tolerated. Will notify her for statin decision after LP (a) resulted.  No evidence of pulmonary HTN on echo, exam normal. Low likelihood of cardiac etiology. Echo essentially normal.            Medication Adjustments/Labs and Tests Ordered: Current medicines are reviewed at length with the patient today.  Concerns regarding medicines are outlined above.  Orders Placed This Encounter  Procedures   Lipoprotein A (LPA)   No orders of the defined types were placed in this encounter.   Patient Instructions  Medication Instructions:  Your physician recommends that you continue on your current medications as directed. Please refer to the Current Medication list given to you today.  *If you need a refill on your cardiac medications before your next appointment, please call your pharmacy*   Lab Work: Lipoprotein(a) If you have labs (blood work) drawn today and your tests are completely normal, you will receive your results only by: MyChart Message (if you have MyChart) OR A paper copy in the mail If you have any lab test that is abnormal or we need to change your treatment, we will call you to review the results.   Testing/Procedures: NONE   Follow-Up: At [facility], you and your health needs are our priority.  As part of our continuing mission to provide you with exceptional heart care, we have created designated Provider Care Teams.  These Care Teams include your primary Cardiologist (physician) and Advanced Practice Providers (APPs -  Physician Assistants and Nurse Practitioners) who all work together to provide you with the care you need, when you need it.  We recommend signing up for the patient portal called "MyChart".  Sign up information is provided on this After Visit  Summary.  MyChart is used to connect  with patients for Virtual Visits (Telemedicine).  Patients are able to view lab/test results, encounter notes, upcoming appointments, etc.  Non-urgent messages can be sent to your provider as well.   To learn more about what you can do with MyChart, go to [email or web address].    Your next appointment:   As Needed  Provider:   Tonny Bollman, MD     Signed, Tonny Bollman, MD  10/17/2022 5:32 PM    [facility]

## 2022-10-18 ENCOUNTER — Other Ambulatory Visit (INDEPENDENT_AMBULATORY_CARE_PROVIDER_SITE_OTHER): Payer: Commercial Managed Care - PPO

## 2022-10-18 DIAGNOSIS — E7800 Pure hypercholesterolemia, unspecified: Secondary | ICD-10-CM

## 2022-10-18 DIAGNOSIS — E538 Deficiency of other specified B group vitamins: Secondary | ICD-10-CM

## 2022-10-18 DIAGNOSIS — E782 Mixed hyperlipidemia: Secondary | ICD-10-CM

## 2022-10-18 DIAGNOSIS — E559 Vitamin D deficiency, unspecified: Secondary | ICD-10-CM

## 2022-10-18 LAB — LIPID PANEL
Cholesterol: 303 mg/dL — ABNORMAL HIGH (ref 0–200)
HDL: 98.1 mg/dL (ref >39.00)
LDL Cholesterol: 177 mg/dL — ABNORMAL HIGH (ref 0–99)
NonHDL: 204.7
Total CHOL/HDL Ratio: 3
Triglycerides: 138 mg/dL (ref 0.0–149.0)
VLDL: 27.6 mg/dL (ref 0.0–40.0)

## 2022-10-18 LAB — VITAMIN D 25 HYDROXY (VIT D DEFICIENCY, FRACTURES): VITD: 37.31 ng/mL (ref 30.00–100.00)

## 2022-10-18 LAB — VITAMIN B12: Vitamin B-12: 362 pg/mL (ref 211–911)

## 2022-10-19 LAB — LIPOPROTEIN A (LPA): Lipoprotein (a): <8.4 nmol/L (ref <75.0)

## 2022-10-22 ENCOUNTER — Other Ambulatory Visit (HOSPITAL_BASED_OUTPATIENT_CLINIC_OR_DEPARTMENT_OTHER): Payer: Self-pay

## 2022-10-22 ENCOUNTER — Encounter: Payer: Self-pay | Admitting: Cardiovascular Disease

## 2022-10-22 ENCOUNTER — Telehealth: Payer: Self-pay

## 2022-10-22 DIAGNOSIS — Z79899 Other long term (current) drug therapy: Secondary | ICD-10-CM

## 2022-10-22 MED ORDER — ROSUVASTATIN CALCIUM 10 MG PO TABS
10.0000 mg | ORAL_TABLET | Freq: Every day | ORAL | 3 refills | Status: DC
  Filled 2022-10-22: qty 90, 90d supply, fill #0
  Filled 2023-01-18: qty 90, 90d supply, fill #1
  Filled 2023-05-12: qty 90, 90d supply, fill #2
  Filled 2023-08-27: qty 90, 90d supply, fill #3

## 2022-10-22 NOTE — Telephone Encounter (Signed)
-----   Message from Tonny Bollman sent at 10/21/2022  1:30 PM EDT ----- LP(a) low - great result. With LDL 177 I'd be inclined to try rosuvastatin 10 mg daily if well-tolerated would continue long-term for primary prevention. Repeat lipids/LFT's 3-4 months. thx

## 2022-10-22 NOTE — Telephone Encounter (Signed)
Received via MyChart: Donato Schultz, MD  P Cv Div Ch St Triage (supporting Tonny Bollman, MD)3 hours ago (1:31 PM)    I'm fine with taking crestor..,, you can send it to [facility] ..   thank you  Rosuvastatin sent to pharmacy as requested. Repeat labs entered and she will have completed at her office in 3-4 months.

## 2022-10-30 ENCOUNTER — Other Ambulatory Visit
Admission: RE | Admit: 2022-10-30 | Discharge: 2022-10-30 | Disposition: A | Discharge status: Home / Self Care | Payer: Commercial Managed Care - PPO | Source: Ambulatory Visit | Attending: Oncology | Admitting: Oncology

## 2022-10-30 DIAGNOSIS — Z006 Encounter for examination for normal comparison and control in clinical research program: Secondary | ICD-10-CM | POA: Insufficient documentation

## 2022-11-08 ENCOUNTER — Ambulatory Visit (INDEPENDENT_AMBULATORY_CARE_PROVIDER_SITE_OTHER): Payer: Commercial Managed Care - PPO | Admitting: Emergency Medicine

## 2022-11-08 DIAGNOSIS — E538 Deficiency of other specified B group vitamins: Secondary | ICD-10-CM

## 2022-11-08 MED ORDER — CYANOCOBALAMIN 1000 MCG/ML IJ SOLN
1000.0000 ug | Freq: Once | INTRAMUSCULAR | Status: AC
Start: 2022-11-08 — End: 2022-11-08
  Administered 2022-11-08: 1000 ug via INTRAMUSCULAR

## 2022-11-08 NOTE — Progress Notes (Signed)
Pt here for monthly B12 injection per PCP.    B12 given IM, and pt tolerated injection well.   Next B12 injection due in one month.

## 2022-11-09 ENCOUNTER — Other Ambulatory Visit: Payer: Self-pay | Admitting: Family Medicine

## 2022-11-10 LAB — HELIX MOLECULAR SCREEN: Genetic Analysis Overall Interpretation: NEGATIVE

## 2022-11-11 ENCOUNTER — Other Ambulatory Visit (HOSPITAL_BASED_OUTPATIENT_CLINIC_OR_DEPARTMENT_OTHER): Payer: Self-pay

## 2022-11-11 MED ORDER — TRAZODONE HCL 50 MG PO TABS
25.0000 mg | ORAL_TABLET | Freq: Every evening | ORAL | 3 refills | Status: DC | PRN
  Filled 2022-11-11: qty 90, 90d supply, fill #0
  Filled 2023-05-12: qty 90, 90d supply, fill #1
  Filled 2023-10-06: qty 90, 90d supply, fill #2

## 2022-11-18 ENCOUNTER — Other Ambulatory Visit: Payer: Self-pay | Admitting: Internal Medicine

## 2022-11-18 ENCOUNTER — Other Ambulatory Visit (HOSPITAL_BASED_OUTPATIENT_CLINIC_OR_DEPARTMENT_OTHER): Payer: Self-pay

## 2022-11-18 ENCOUNTER — Other Ambulatory Visit (HOSPITAL_COMMUNITY): Payer: Self-pay

## 2022-11-18 MED ORDER — PREDNISONE 10 MG PO TABS
ORAL_TABLET | ORAL | 0 refills | Status: AC
  Filled 2022-11-18 (×3): qty 20, 8d supply, fill #0

## 2022-11-18 NOTE — Progress Notes (Signed)
Developed left sided back pain with some radiation to the left calf. Sent a round of prednisone, plans to see her chiropractor.

## 2022-11-19 ENCOUNTER — Other Ambulatory Visit (HOSPITAL_COMMUNITY): Payer: Self-pay

## 2022-11-27 DIAGNOSIS — R3121 Asymptomatic microscopic hematuria: Secondary | ICD-10-CM | POA: Diagnosis not present

## 2022-12-09 ENCOUNTER — Other Ambulatory Visit (HOSPITAL_BASED_OUTPATIENT_CLINIC_OR_DEPARTMENT_OTHER): Payer: Self-pay

## 2023-01-04 ENCOUNTER — Other Ambulatory Visit: Payer: Self-pay | Admitting: Family Medicine

## 2023-01-06 ENCOUNTER — Other Ambulatory Visit (HOSPITAL_BASED_OUTPATIENT_CLINIC_OR_DEPARTMENT_OTHER): Payer: Self-pay

## 2023-01-06 MED ORDER — CYCLOBENZAPRINE HCL 10 MG PO TABS
5.0000 mg | ORAL_TABLET | Freq: Three times a day (TID) | ORAL | 3 refills | Status: AC | PRN
  Filled 2023-01-06: qty 90, 30d supply, fill #0
  Filled 2023-03-12: qty 90, 30d supply, fill #1
  Filled 2023-06-30: qty 90, 30d supply, fill #2
  Filled 2023-10-28: qty 90, 30d supply, fill #3

## 2023-01-06 NOTE — Telephone Encounter (Signed)
Last filled on 9//24 #90 tab/ 0 refills

## 2023-01-09 ENCOUNTER — Other Ambulatory Visit (HOSPITAL_BASED_OUTPATIENT_CLINIC_OR_DEPARTMENT_OTHER): Payer: Self-pay

## 2023-01-09 ENCOUNTER — Other Ambulatory Visit: Payer: Self-pay

## 2023-01-09 MED ORDER — VENLAFAXINE HCL ER 150 MG PO CP24
150.0000 mg | ORAL_CAPSULE | Freq: Every day | ORAL | 0 refills | Status: DC
  Filled 2023-01-09: qty 90, 90d supply, fill #0

## 2023-01-18 ENCOUNTER — Other Ambulatory Visit (HOSPITAL_BASED_OUTPATIENT_CLINIC_OR_DEPARTMENT_OTHER): Payer: Self-pay

## 2023-01-20 ENCOUNTER — Other Ambulatory Visit: Payer: Self-pay

## 2023-01-20 ENCOUNTER — Other Ambulatory Visit (HOSPITAL_BASED_OUTPATIENT_CLINIC_OR_DEPARTMENT_OTHER): Payer: Self-pay

## 2023-01-20 MED ORDER — VEOZAH 45 MG PO TABS
45.0000 mg | ORAL_TABLET | Freq: Every day | ORAL | 0 refills | Status: DC
  Filled 2023-01-20: qty 30, 30d supply, fill #0

## 2023-01-29 DIAGNOSIS — Z6841 Body Mass Index (BMI) 40.0 and over, adult: Secondary | ICD-10-CM | POA: Diagnosis not present

## 2023-01-29 DIAGNOSIS — Z01419 Encounter for gynecological examination (general) (routine) without abnormal findings: Secondary | ICD-10-CM | POA: Diagnosis not present

## 2023-01-29 DIAGNOSIS — Z124 Encounter for screening for malignant neoplasm of cervix: Secondary | ICD-10-CM | POA: Diagnosis not present

## 2023-02-12 ENCOUNTER — Other Ambulatory Visit: Payer: Self-pay | Admitting: Neurosurgery

## 2023-02-12 DIAGNOSIS — Z6841 Body Mass Index (BMI) 40.0 and over, adult: Secondary | ICD-10-CM | POA: Diagnosis not present

## 2023-02-12 DIAGNOSIS — M1711 Unilateral primary osteoarthritis, right knee: Secondary | ICD-10-CM | POA: Diagnosis not present

## 2023-02-12 DIAGNOSIS — M5416 Radiculopathy, lumbar region: Secondary | ICD-10-CM | POA: Diagnosis not present

## 2023-02-12 DIAGNOSIS — M25562 Pain in left knee: Secondary | ICD-10-CM | POA: Diagnosis not present

## 2023-02-12 DIAGNOSIS — M25561 Pain in right knee: Secondary | ICD-10-CM | POA: Diagnosis not present

## 2023-02-12 DIAGNOSIS — M1712 Unilateral primary osteoarthritis, left knee: Secondary | ICD-10-CM | POA: Diagnosis not present

## 2023-02-13 ENCOUNTER — Encounter: Payer: Self-pay | Admitting: Neurosurgery

## 2023-02-24 ENCOUNTER — Other Ambulatory Visit (HOSPITAL_COMMUNITY): Payer: Self-pay

## 2023-02-24 ENCOUNTER — Encounter: Payer: Self-pay | Admitting: Family Medicine

## 2023-02-24 ENCOUNTER — Other Ambulatory Visit (HOSPITAL_BASED_OUTPATIENT_CLINIC_OR_DEPARTMENT_OTHER): Payer: Self-pay

## 2023-02-24 MED ORDER — CYANOCOBALAMIN 1000 MCG/ML IJ SOLN
1000.0000 ug | INTRAMUSCULAR | 1 refills | Status: DC
  Filled 2023-02-24 – 2023-02-25 (×3): qty 3, 90d supply, fill #0
  Filled 2023-04-14 – 2023-05-12 (×3): qty 3, 90d supply, fill #1

## 2023-02-25 ENCOUNTER — Other Ambulatory Visit (HOSPITAL_COMMUNITY): Payer: Self-pay

## 2023-02-25 ENCOUNTER — Other Ambulatory Visit (HOSPITAL_BASED_OUTPATIENT_CLINIC_OR_DEPARTMENT_OTHER): Payer: Self-pay

## 2023-02-27 ENCOUNTER — Other Ambulatory Visit (HOSPITAL_BASED_OUTPATIENT_CLINIC_OR_DEPARTMENT_OTHER): Payer: Self-pay

## 2023-02-27 ENCOUNTER — Other Ambulatory Visit: Payer: Self-pay

## 2023-02-27 MED ORDER — VEOZAH 45 MG PO TABS
45.0000 mg | ORAL_TABLET | Freq: Every day | ORAL | 3 refills | Status: AC
  Filled 2023-02-27: qty 90, 90d supply, fill #0
  Filled 2023-05-31: qty 90, 90d supply, fill #1
  Filled 2023-10-06: qty 30, 30d supply, fill #2
  Filled 2023-11-03: qty 30, 30d supply, fill #3
  Filled 2023-12-03 – 2023-12-20 (×2): qty 30, 30d supply, fill #4
  Filled 2024-01-19 – 2024-01-26 (×2): qty 30, 30d supply, fill #5

## 2023-03-01 ENCOUNTER — Ambulatory Visit
Admission: RE | Admit: 2023-03-01 | Discharge: 2023-03-01 | Disposition: A | Discharge status: Home / Self Care | Payer: Commercial Managed Care - PPO | Source: Ambulatory Visit | Attending: Neurosurgery | Admitting: Neurosurgery

## 2023-03-01 DIAGNOSIS — M545 Low back pain, unspecified: Secondary | ICD-10-CM | POA: Diagnosis not present

## 2023-03-01 DIAGNOSIS — M5416 Radiculopathy, lumbar region: Secondary | ICD-10-CM

## 2023-03-01 DIAGNOSIS — Z981 Arthrodesis status: Secondary | ICD-10-CM | POA: Diagnosis not present

## 2023-03-05 DIAGNOSIS — M5416 Radiculopathy, lumbar region: Secondary | ICD-10-CM | POA: Diagnosis not present

## 2023-03-07 ENCOUNTER — Other Ambulatory Visit (HOSPITAL_BASED_OUTPATIENT_CLINIC_OR_DEPARTMENT_OTHER): Payer: Self-pay

## 2023-03-12 ENCOUNTER — Other Ambulatory Visit (HOSPITAL_BASED_OUTPATIENT_CLINIC_OR_DEPARTMENT_OTHER): Payer: Self-pay

## 2023-03-12 DIAGNOSIS — R351 Nocturia: Secondary | ICD-10-CM | POA: Diagnosis not present

## 2023-03-12 DIAGNOSIS — N3281 Overactive bladder: Secondary | ICD-10-CM | POA: Diagnosis not present

## 2023-03-12 MED ORDER — GEMTESA 75 MG PO TABS
1.0000 | ORAL_TABLET | Freq: Every day | ORAL | 6 refills | Status: DC
Start: 2023-03-12 — End: 2023-03-24
  Filled 2023-03-12: qty 30, 30d supply, fill #0

## 2023-03-13 ENCOUNTER — Other Ambulatory Visit (HOSPITAL_BASED_OUTPATIENT_CLINIC_OR_DEPARTMENT_OTHER): Payer: Self-pay

## 2023-03-18 ENCOUNTER — Other Ambulatory Visit (HOSPITAL_BASED_OUTPATIENT_CLINIC_OR_DEPARTMENT_OTHER): Payer: Self-pay

## 2023-03-19 ENCOUNTER — Other Ambulatory Visit (HOSPITAL_BASED_OUTPATIENT_CLINIC_OR_DEPARTMENT_OTHER): Payer: Self-pay

## 2023-03-20 ENCOUNTER — Other Ambulatory Visit (HOSPITAL_BASED_OUTPATIENT_CLINIC_OR_DEPARTMENT_OTHER): Payer: Self-pay

## 2023-03-26 ENCOUNTER — Other Ambulatory Visit (HOSPITAL_BASED_OUTPATIENT_CLINIC_OR_DEPARTMENT_OTHER): Payer: Self-pay

## 2023-03-26 MED ORDER — MELOXICAM 15 MG PO TABS
15.0000 mg | ORAL_TABLET | Freq: Every day | ORAL | 1 refills | Status: DC | PRN
  Filled 2023-03-26: qty 30, 30d supply, fill #0
  Filled 2023-04-18: qty 30, 30d supply, fill #1

## 2023-04-03 ENCOUNTER — Other Ambulatory Visit: Payer: Self-pay | Admitting: Family Medicine

## 2023-04-03 ENCOUNTER — Telehealth: Payer: Self-pay | Admitting: Family Medicine

## 2023-04-03 ENCOUNTER — Other Ambulatory Visit (HOSPITAL_BASED_OUTPATIENT_CLINIC_OR_DEPARTMENT_OTHER): Payer: Self-pay

## 2023-04-03 DIAGNOSIS — Z7184 Encounter for health counseling related to travel: Secondary | ICD-10-CM

## 2023-04-03 MED ORDER — AZITHROMYCIN 250 MG PO TABS
ORAL_TABLET | ORAL | 0 refills | Status: AC
Start: 2023-04-03 — End: 2023-04-08
  Filled 2023-04-03: qty 6, 5d supply, fill #0

## 2023-04-03 MED ORDER — SCOPOLAMINE 1 MG/3DAYS TD PT72
1.0000 | MEDICATED_PATCH | TRANSDERMAL | 1 refills | Status: DC
Start: 2023-04-03 — End: 2023-10-22
  Filled 2023-04-03: qty 4, 12d supply, fill #0
  Filled 2023-06-30: qty 4, 12d supply, fill #1

## 2023-04-03 MED ORDER — OSELTAMIVIR PHOSPHATE 75 MG PO CAPS
75.0000 mg | ORAL_CAPSULE | Freq: Two times a day (BID) | ORAL | 0 refills | Status: DC
Start: 2023-04-03 — End: 2023-05-28
  Filled 2023-04-03: qty 10, 5d supply, fill #0

## 2023-04-03 MED ORDER — CEFDINIR 300 MG PO CAPS
300.0000 mg | ORAL_CAPSULE | Freq: Two times a day (BID) | ORAL | 0 refills | Status: AC
  Filled 2023-04-03: qty 20, 10d supply, fill #0

## 2023-04-03 NOTE — Telephone Encounter (Signed)
 Please send in cruise medications.

## 2023-04-14 ENCOUNTER — Other Ambulatory Visit (HOSPITAL_BASED_OUTPATIENT_CLINIC_OR_DEPARTMENT_OTHER): Payer: Self-pay

## 2023-04-14 MED ORDER — VENLAFAXINE HCL ER 150 MG PO CP24
150.0000 mg | ORAL_CAPSULE | Freq: Every day | ORAL | 3 refills | Status: AC
  Filled 2023-04-14: qty 90, 90d supply, fill #0
  Filled 2023-07-15: qty 90, 90d supply, fill #1
  Filled 2023-10-06: qty 90, 90d supply, fill #2
  Filled 2024-01-11: qty 90, 90d supply, fill #3

## 2023-04-16 DIAGNOSIS — M1712 Unilateral primary osteoarthritis, left knee: Secondary | ICD-10-CM | POA: Diagnosis not present

## 2023-04-21 ENCOUNTER — Other Ambulatory Visit (HOSPITAL_BASED_OUTPATIENT_CLINIC_OR_DEPARTMENT_OTHER): Payer: Self-pay

## 2023-04-23 DIAGNOSIS — M1712 Unilateral primary osteoarthritis, left knee: Secondary | ICD-10-CM | POA: Diagnosis not present

## 2023-04-29 ENCOUNTER — Other Ambulatory Visit (HOSPITAL_BASED_OUTPATIENT_CLINIC_OR_DEPARTMENT_OTHER): Payer: Self-pay

## 2023-04-30 DIAGNOSIS — M1712 Unilateral primary osteoarthritis, left knee: Secondary | ICD-10-CM | POA: Diagnosis not present

## 2023-05-12 ENCOUNTER — Other Ambulatory Visit: Payer: Self-pay

## 2023-05-28 ENCOUNTER — Other Ambulatory Visit: Payer: Self-pay | Admitting: Family Medicine

## 2023-05-28 ENCOUNTER — Encounter: Payer: Self-pay | Admitting: Family Medicine

## 2023-06-02 ENCOUNTER — Other Ambulatory Visit (HOSPITAL_BASED_OUTPATIENT_CLINIC_OR_DEPARTMENT_OTHER): Payer: Self-pay

## 2023-06-03 ENCOUNTER — Other Ambulatory Visit (HOSPITAL_BASED_OUTPATIENT_CLINIC_OR_DEPARTMENT_OTHER): Payer: Self-pay

## 2023-06-03 MED ORDER — OXYBUTYNIN CHLORIDE 5 MG PO TABS
ORAL_TABLET | ORAL | 6 refills | Status: AC
  Filled 2023-06-03: qty 90, 45d supply, fill #0
  Filled 2023-07-15: qty 90, 45d supply, fill #1
  Filled 2023-10-06: qty 90, 45d supply, fill #2
  Filled 2023-11-19: qty 90, 45d supply, fill #3
  Filled 2024-01-06: qty 90, 45d supply, fill #4

## 2023-06-06 ENCOUNTER — Other Ambulatory Visit (HOSPITAL_BASED_OUTPATIENT_CLINIC_OR_DEPARTMENT_OTHER): Payer: Self-pay

## 2023-06-10 ENCOUNTER — Other Ambulatory Visit (HOSPITAL_BASED_OUTPATIENT_CLINIC_OR_DEPARTMENT_OTHER): Payer: Self-pay

## 2023-06-11 DIAGNOSIS — M25562 Pain in left knee: Secondary | ICD-10-CM | POA: Diagnosis not present

## 2023-06-16 ENCOUNTER — Other Ambulatory Visit (HOSPITAL_BASED_OUTPATIENT_CLINIC_OR_DEPARTMENT_OTHER): Payer: Self-pay

## 2023-06-25 DIAGNOSIS — S83412A Sprain of medial collateral ligament of left knee, initial encounter: Secondary | ICD-10-CM | POA: Diagnosis not present

## 2023-06-25 DIAGNOSIS — M23242 Derangement of anterior horn of lateral meniscus due to old tear or injury, left knee: Secondary | ICD-10-CM | POA: Diagnosis not present

## 2023-06-30 ENCOUNTER — Other Ambulatory Visit: Payer: Self-pay

## 2023-06-30 ENCOUNTER — Other Ambulatory Visit (HOSPITAL_BASED_OUTPATIENT_CLINIC_OR_DEPARTMENT_OTHER): Payer: Self-pay

## 2023-07-01 ENCOUNTER — Other Ambulatory Visit (HOSPITAL_BASED_OUTPATIENT_CLINIC_OR_DEPARTMENT_OTHER): Payer: Self-pay

## 2023-07-01 DIAGNOSIS — S83512A Sprain of anterior cruciate ligament of left knee, initial encounter: Secondary | ICD-10-CM | POA: Diagnosis not present

## 2023-07-02 ENCOUNTER — Other Ambulatory Visit (HOSPITAL_BASED_OUTPATIENT_CLINIC_OR_DEPARTMENT_OTHER): Payer: Self-pay

## 2023-07-02 DIAGNOSIS — M25562 Pain in left knee: Secondary | ICD-10-CM | POA: Diagnosis not present

## 2023-07-04 ENCOUNTER — Other Ambulatory Visit (HOSPITAL_BASED_OUTPATIENT_CLINIC_OR_DEPARTMENT_OTHER): Payer: Self-pay

## 2023-07-08 ENCOUNTER — Other Ambulatory Visit (HOSPITAL_BASED_OUTPATIENT_CLINIC_OR_DEPARTMENT_OTHER): Payer: Self-pay

## 2023-07-08 MED ORDER — MELOXICAM 15 MG PO TABS
15.0000 mg | ORAL_TABLET | Freq: Every day | ORAL | 1 refills | Status: DC | PRN
  Filled 2023-07-08: qty 30, 30d supply, fill #0
  Filled 2023-07-24 – 2023-08-01 (×2): qty 30, 30d supply, fill #1

## 2023-07-09 ENCOUNTER — Encounter: Payer: Self-pay | Admitting: Cardiovascular Disease

## 2023-07-09 ENCOUNTER — Encounter: Payer: Self-pay | Admitting: Family Medicine

## 2023-07-09 DIAGNOSIS — M25562 Pain in left knee: Secondary | ICD-10-CM | POA: Diagnosis not present

## 2023-07-14 ENCOUNTER — Other Ambulatory Visit: Payer: Self-pay | Admitting: Dermatology

## 2023-07-14 ENCOUNTER — Encounter (HOSPITAL_BASED_OUTPATIENT_CLINIC_OR_DEPARTMENT_OTHER): Payer: Self-pay

## 2023-07-14 ENCOUNTER — Other Ambulatory Visit (HOSPITAL_BASED_OUTPATIENT_CLINIC_OR_DEPARTMENT_OTHER): Payer: Self-pay

## 2023-07-16 DIAGNOSIS — S83512D Sprain of anterior cruciate ligament of left knee, subsequent encounter: Secondary | ICD-10-CM | POA: Diagnosis not present

## 2023-07-16 DIAGNOSIS — M25562 Pain in left knee: Secondary | ICD-10-CM | POA: Diagnosis not present

## 2023-07-16 DIAGNOSIS — S83512S Sprain of anterior cruciate ligament of left knee, sequela: Secondary | ICD-10-CM | POA: Diagnosis not present

## 2023-07-21 ENCOUNTER — Other Ambulatory Visit (HOSPITAL_COMMUNITY): Payer: Self-pay

## 2023-07-21 ENCOUNTER — Encounter (HOSPITAL_COMMUNITY): Payer: Self-pay

## 2023-07-23 DIAGNOSIS — M25562 Pain in left knee: Secondary | ICD-10-CM | POA: Diagnosis not present

## 2023-07-23 DIAGNOSIS — S83512S Sprain of anterior cruciate ligament of left knee, sequela: Secondary | ICD-10-CM | POA: Diagnosis not present

## 2023-07-24 ENCOUNTER — Other Ambulatory Visit (HOSPITAL_BASED_OUTPATIENT_CLINIC_OR_DEPARTMENT_OTHER): Payer: Self-pay

## 2023-08-01 ENCOUNTER — Encounter: Payer: Self-pay | Admitting: Advanced Practice Midwife

## 2023-08-06 DIAGNOSIS — M25562 Pain in left knee: Secondary | ICD-10-CM | POA: Diagnosis not present

## 2023-08-06 DIAGNOSIS — S83512S Sprain of anterior cruciate ligament of left knee, sequela: Secondary | ICD-10-CM | POA: Diagnosis not present

## 2023-08-27 DIAGNOSIS — S83512D Sprain of anterior cruciate ligament of left knee, subsequent encounter: Secondary | ICD-10-CM | POA: Diagnosis not present

## 2023-08-27 DIAGNOSIS — M25562 Pain in left knee: Secondary | ICD-10-CM | POA: Diagnosis not present

## 2023-08-28 ENCOUNTER — Other Ambulatory Visit (HOSPITAL_BASED_OUTPATIENT_CLINIC_OR_DEPARTMENT_OTHER): Payer: Self-pay

## 2023-08-28 MED ORDER — MELOXICAM 15 MG PO TABS
15.0000 mg | ORAL_TABLET | Freq: Every day | ORAL | 1 refills | Status: AC | PRN
  Filled 2023-08-28: qty 30, 30d supply, fill #0
  Filled 2023-10-28: qty 30, 30d supply, fill #1

## 2023-09-03 DIAGNOSIS — H5203 Hypermetropia, bilateral: Secondary | ICD-10-CM | POA: Diagnosis not present

## 2023-09-03 DIAGNOSIS — H524 Presbyopia: Secondary | ICD-10-CM | POA: Diagnosis not present

## 2023-09-03 DIAGNOSIS — H04123 Dry eye syndrome of bilateral lacrimal glands: Secondary | ICD-10-CM | POA: Diagnosis not present

## 2023-09-03 DIAGNOSIS — H52223 Regular astigmatism, bilateral: Secondary | ICD-10-CM | POA: Diagnosis not present

## 2023-09-03 DIAGNOSIS — Z135 Encounter for screening for eye and ear disorders: Secondary | ICD-10-CM | POA: Diagnosis not present

## 2023-09-04 ENCOUNTER — Other Ambulatory Visit: Payer: Self-pay | Admitting: Family Medicine

## 2023-09-05 ENCOUNTER — Other Ambulatory Visit (HOSPITAL_BASED_OUTPATIENT_CLINIC_OR_DEPARTMENT_OTHER): Payer: Self-pay

## 2023-09-05 MED ORDER — HYDROCHLOROTHIAZIDE 25 MG PO TABS
25.0000 mg | ORAL_TABLET | Freq: Every day | ORAL | 0 refills | Status: DC | PRN
  Filled 2023-09-05: qty 30, 30d supply, fill #0

## 2023-09-05 NOTE — Telephone Encounter (Signed)
 lvm for pt to call office to schedule appt.

## 2023-09-05 NOTE — Telephone Encounter (Signed)
 Med refilled once.  Pt is due for a CPE or at least a f/u for med refill. Please schedule

## 2023-09-08 ENCOUNTER — Telehealth: Payer: Self-pay | Admitting: Family Medicine

## 2023-09-08 DIAGNOSIS — M25562 Pain in left knee: Secondary | ICD-10-CM | POA: Diagnosis not present

## 2023-09-08 DIAGNOSIS — Z131 Encounter for screening for diabetes mellitus: Secondary | ICD-10-CM

## 2023-09-08 DIAGNOSIS — E7800 Pure hypercholesterolemia, unspecified: Secondary | ICD-10-CM

## 2023-09-08 DIAGNOSIS — Z6841 Body Mass Index (BMI) 40.0 and over, adult: Secondary | ICD-10-CM

## 2023-09-08 DIAGNOSIS — Z9884 Bariatric surgery status: Secondary | ICD-10-CM

## 2023-09-08 DIAGNOSIS — E559 Vitamin D deficiency, unspecified: Secondary | ICD-10-CM

## 2023-09-08 DIAGNOSIS — Z0000 Encounter for general adult medical examination without abnormal findings: Secondary | ICD-10-CM

## 2023-09-08 DIAGNOSIS — E538 Deficiency of other specified B group vitamins: Secondary | ICD-10-CM

## 2023-09-08 NOTE — Telephone Encounter (Signed)
 CPE has been scheduled, please place future lab orders so Dr. Cruz can get them done at her [place] office prior to appt

## 2023-09-08 NOTE — Telephone Encounter (Signed)
The orders are in.

## 2023-09-08 NOTE — Telephone Encounter (Signed)
 Future lab order for cpx

## 2023-09-10 DIAGNOSIS — M25562 Pain in left knee: Secondary | ICD-10-CM | POA: Diagnosis not present

## 2023-09-16 ENCOUNTER — Other Ambulatory Visit: Payer: Self-pay | Admitting: Family Medicine

## 2023-09-16 DIAGNOSIS — Z1231 Encounter for screening mammogram for malignant neoplasm of breast: Secondary | ICD-10-CM

## 2023-09-17 DIAGNOSIS — M25562 Pain in left knee: Secondary | ICD-10-CM | POA: Diagnosis not present

## 2023-09-24 ENCOUNTER — Ambulatory Visit

## 2023-09-24 ENCOUNTER — Ambulatory Visit
Admission: RE | Admit: 2023-09-24 | Discharge: 2023-09-24 | Disposition: A | Discharge status: Home / Self Care | Source: Ambulatory Visit | Attending: Family Medicine | Admitting: Family Medicine

## 2023-09-24 DIAGNOSIS — Z1231 Encounter for screening mammogram for malignant neoplasm of breast: Secondary | ICD-10-CM

## 2023-09-24 DIAGNOSIS — M25562 Pain in left knee: Secondary | ICD-10-CM | POA: Diagnosis not present

## 2023-09-29 ENCOUNTER — Ambulatory Visit: Payer: Self-pay | Admitting: Family Medicine

## 2023-09-29 DIAGNOSIS — M25562 Pain in left knee: Secondary | ICD-10-CM | POA: Diagnosis not present

## 2023-10-01 DIAGNOSIS — M25561 Pain in right knee: Secondary | ICD-10-CM | POA: Diagnosis not present

## 2023-10-07 ENCOUNTER — Other Ambulatory Visit (HOSPITAL_BASED_OUTPATIENT_CLINIC_OR_DEPARTMENT_OTHER): Payer: Self-pay

## 2023-10-08 DIAGNOSIS — M25562 Pain in left knee: Secondary | ICD-10-CM | POA: Diagnosis not present

## 2023-10-08 DIAGNOSIS — S83512D Sprain of anterior cruciate ligament of left knee, subsequent encounter: Secondary | ICD-10-CM | POA: Diagnosis not present

## 2023-10-13 DIAGNOSIS — M25562 Pain in left knee: Secondary | ICD-10-CM | POA: Diagnosis not present

## 2023-10-15 DIAGNOSIS — M25562 Pain in left knee: Secondary | ICD-10-CM | POA: Diagnosis not present

## 2023-10-17 ENCOUNTER — Other Ambulatory Visit

## 2023-10-17 DIAGNOSIS — E559 Vitamin D deficiency, unspecified: Secondary | ICD-10-CM

## 2023-10-17 DIAGNOSIS — Z0000 Encounter for general adult medical examination without abnormal findings: Secondary | ICD-10-CM | POA: Diagnosis not present

## 2023-10-17 DIAGNOSIS — Z131 Encounter for screening for diabetes mellitus: Secondary | ICD-10-CM | POA: Diagnosis not present

## 2023-10-17 DIAGNOSIS — Z6841 Body Mass Index (BMI) 40.0 and over, adult: Secondary | ICD-10-CM | POA: Diagnosis not present

## 2023-10-17 DIAGNOSIS — E538 Deficiency of other specified B group vitamins: Secondary | ICD-10-CM | POA: Diagnosis not present

## 2023-10-17 DIAGNOSIS — Z9884 Bariatric surgery status: Secondary | ICD-10-CM

## 2023-10-17 DIAGNOSIS — E7800 Pure hypercholesterolemia, unspecified: Secondary | ICD-10-CM | POA: Diagnosis not present

## 2023-10-17 LAB — COMPREHENSIVE METABOLIC PANEL WITH GFR
ALT: 37 U/L — ABNORMAL HIGH (ref 0–35)
AST: 29 U/L (ref 0–37)
Albumin: 4.4 g/dL (ref 3.5–5.2)
Alkaline Phosphatase: 56 U/L (ref 39–117)
BUN: 14 mg/dL (ref 6–23)
CO2: 28 meq/L (ref 19–32)
Calcium: 9.4 mg/dL (ref 8.4–10.5)
Chloride: 99 meq/L (ref 96–112)
Creatinine, Ser: 0.62 mg/dL (ref 0.40–1.20)
GFR: 98.85 mL/min (ref >60.00)
Glucose, Bld: 84 mg/dL (ref 70–99)
Potassium: 3.8 meq/L (ref 3.5–5.1)
Sodium: 137 meq/L (ref 135–145)
Total Bilirubin: 0.9 mg/dL (ref 0.2–1.2)
Total Protein: 6.7 g/dL (ref 6.0–8.3)

## 2023-10-17 LAB — LIPID PANEL
Cholesterol: 196 mg/dL (ref 0–200)
HDL: 85 mg/dL (ref >39.00)
LDL Cholesterol: 94 mg/dL (ref 0–99)
NonHDL: 110.91
Total CHOL/HDL Ratio: 2
Triglycerides: 83 mg/dL (ref 0.0–149.0)
VLDL: 16.6 mg/dL (ref 0.0–40.0)

## 2023-10-17 LAB — CBC WITH DIFFERENTIAL/PLATELET
Basophils Absolute: 0 K/uL (ref 0.0–0.1)
Basophils Relative: 0.4 % (ref 0.0–3.0)
Eosinophils Absolute: 0.2 K/uL (ref 0.0–0.7)
Eosinophils Relative: 3 % (ref 0.0–5.0)
HCT: 40.2 % (ref 36.0–46.0)
Hemoglobin: 13.8 g/dL (ref 12.0–15.0)
Lymphocytes Relative: 25.4 % (ref 12.0–46.0)
Lymphs Abs: 1.5 K/uL (ref 0.7–4.0)
MCHC: 34.3 g/dL (ref 30.0–36.0)
MCV: 97.9 fl (ref 78.0–100.0)
Monocytes Absolute: 0.5 K/uL (ref 0.1–1.0)
Monocytes Relative: 9.3 % (ref 3.0–12.0)
Neutro Abs: 3.6 K/uL (ref 1.4–7.7)
Neutrophils Relative %: 61.9 % (ref 43.0–77.0)
Platelets: 303 K/uL (ref 150.0–400.0)
RBC: 4.11 Mil/uL (ref 3.87–5.11)
RDW: 12.9 % (ref 11.5–15.5)
WBC: 5.8 K/uL (ref 4.0–10.5)

## 2023-10-17 LAB — VITAMIN D 25 HYDROXY (VIT D DEFICIENCY, FRACTURES): VITD: 20.54 ng/mL — ABNORMAL LOW (ref 30.00–100.00)

## 2023-10-17 LAB — TSH: TSH: 2.95 u[IU]/mL (ref 0.35–5.50)

## 2023-10-17 LAB — IRON: Iron: 111 ug/dL (ref 42–145)

## 2023-10-17 LAB — HEMOGLOBIN A1C: Hgb A1c MFr Bld: 5.4 % (ref 4.6–6.5)

## 2023-10-17 LAB — VITAMIN B12: Vitamin B-12: 277 pg/mL (ref 211–911)

## 2023-10-18 ENCOUNTER — Ambulatory Visit: Payer: Self-pay | Admitting: Family Medicine

## 2023-10-22 ENCOUNTER — Other Ambulatory Visit (HOSPITAL_BASED_OUTPATIENT_CLINIC_OR_DEPARTMENT_OTHER): Payer: Self-pay

## 2023-10-22 ENCOUNTER — Ambulatory Visit: Admitting: Family Medicine

## 2023-10-22 VITALS — BP 124/86 | HR 61 | Temp 97.8°F | Ht 63.75 in | Wt 239.2 lb

## 2023-10-22 DIAGNOSIS — Z0000 Encounter for general adult medical examination without abnormal findings: Secondary | ICD-10-CM | POA: Diagnosis not present

## 2023-10-22 DIAGNOSIS — Z1211 Encounter for screening for malignant neoplasm of colon: Secondary | ICD-10-CM | POA: Diagnosis not present

## 2023-10-22 DIAGNOSIS — Z9884 Bariatric surgery status: Secondary | ICD-10-CM

## 2023-10-22 DIAGNOSIS — Z6841 Body Mass Index (BMI) 40.0 and over, adult: Secondary | ICD-10-CM | POA: Diagnosis not present

## 2023-10-22 DIAGNOSIS — N951 Menopausal and female climacteric states: Secondary | ICD-10-CM | POA: Diagnosis not present

## 2023-10-22 DIAGNOSIS — E7800 Pure hypercholesterolemia, unspecified: Secondary | ICD-10-CM | POA: Diagnosis not present

## 2023-10-22 DIAGNOSIS — E559 Vitamin D deficiency, unspecified: Secondary | ICD-10-CM | POA: Diagnosis not present

## 2023-10-22 DIAGNOSIS — E538 Deficiency of other specified B group vitamins: Secondary | ICD-10-CM

## 2023-10-22 MED ORDER — CYANOCOBALAMIN 1000 MCG/ML IJ SOLN
1000.0000 ug | INTRAMUSCULAR | 3 refills | Status: AC
  Filled 2023-10-22: qty 3, 90d supply, fill #0
  Filled 2024-01-19 – 2024-01-26 (×2): qty 3, 90d supply, fill #1

## 2023-10-22 NOTE — Assessment & Plan Note (Signed)
 Reviewed health habits including diet and exercise and skin cancer prevention Reviewed appropriate screening tests for age  Also reviewed health mt list, fam hx and immunization status , as well as social and family history   See HPI Labs reviewed and ordered Health Maintenance  Topic Date Due   Hepatitis C Screening  Never done   Hepatitis B Vaccine (1 of 3 - 19+ 3-dose series) Never done   Pneumococcal Vaccine for age over 77 (1 of 1 - PCV) Never done   Flu Shot  04/13/2024*   COVID-19 Vaccine (6 - 2025-26 season) 11/06/2025*   Pap with HPV screening  12/28/2024   Breast Cancer Screening  09/23/2025   DTaP/Tdap/Td vaccine (3 - Td or Tdap) 08/08/2026   Colon Cancer Screening  07/14/2028   HIV Screening  Completed   Zoster (Shingles) Vaccine  Completed   HPV Vaccine  Aged Out   Meningitis B Vaccine  Aged Out  *Topic was postponed. The date shown is not the original due date.    Planning flu shot at work  Gyn care with Dr Linette upcoming  Dexa also  Discussed fall prevention, supplements and exercise for bone density  PHQ 2

## 2023-10-22 NOTE — Patient Instructions (Addendum)
 Try 2 of the trazodone  to see if it helps sleep   Ask Dr Mat about vaginal estrogen cream   Get on your vitamin D  3 regularly !!   Get back to the gym when you can  I sent in the B12 shots

## 2023-10-22 NOTE — Assessment & Plan Note (Signed)
 Lab Results  Component Value Date   VITAMINB12 277 10/17/2023   Last vitamin D  Lab Results  Component Value Date   VD25OH 20.54 (L) 10/17/2023   Normal H/ H

## 2023-10-22 NOTE — Assessment & Plan Note (Signed)
 Disc goals for lipids and reasons to control them Rev last labs with pt Rev low sat fat diet in detail LDL down to 94 with crestor  10 mg daily  Tolerating well Good diet

## 2023-10-22 NOTE — Assessment & Plan Note (Signed)
 Continues tirzepatide  65 u weekly  Working on diet /exercise  Encouraged getting back to Chiropractor

## 2023-10-22 NOTE — Progress Notes (Signed)
 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   MRN: [id number]  HPI  Here for health maintenance exam and to review chronic medical problems   Wt Readings from Last 3 Encounters:  10/22/23 239 lb 4 oz (108.5 kg)  10/17/22 249 lb 6.4 oz (113.1 kg)  08/28/22 247 lb 3.2 oz (112.1 kg)   41.39 kg/m  Vitals:   10/22/23 1401  BP: 124/86  Pulse: 61  Temp: 97.8 F (36.6 C)  SpO2: 98%    Immunization History  Administered Date(s) Administered   Hepatitis A 09/29/2002   INFLUENZA, HIGH DOSE SEASONAL PF 08/23/2015   Influenza Whole 10/03/2011   Influenza,inj,quad, With Preservative 10/15/2018   Influenza-Unspecified 09/29/2014, 10/17/2016, 10/12/2018   MMR 07/09/2023   PFIZER Comirnaty(Gray Top)Covid-19 Tri-Sucrose Vaccine 06/16/2020   PFIZER(Purple Top)SARS-COV-2 Vaccination 01/06/2019, 01/27/2019, 09/11/2019   Pfizer Covid-19 Vaccine Bivalent Booster 60yrs & up 10/03/2020   Td 01/14/1994   Tdap 08/07/2016   Zoster Recombinant(Shingrix ) 05/13/2016, 07/09/2016    Health Maintenance Due  Topic Date Due   Hepatitis C Screening  Never done   Hepatitis B Vaccines 19-59 Average Risk (1 of 3 - 19+ 3-dose series) Never done   Pneumococcal Vaccine: 50+ Years (1 of 1 - PCV) Never done   Flu shot -though cone /upcoming   Tore her ACL-[facility]  Conservative care and PT    Mammogram 09/2023  Self breast exam no lumps   Gyn health [facility] for incontinence - tried myrbetriq , worked a little /not great  Oxybutinin -dries her up too much     Colon cancer screening   Colonoscopy 07/2018   Bone health  Dexa 12/2021 -normal at gyn /another planned in [place]  Falls- one/hurt knee -tangled in comforter  Fractures- none  Supplements  Last vitamin D  Lab Results  Component Value Date   VD25OH 20.54 (L) 10/17/2023    Exercise  PT  Wants to get back in gym soon     [facility]   Mood    10/22/2023    2:06 PM 08/28/2022    1:59 PM 06/14/2020   11:27 AM 06/02/2019    3:27 PM 08/07/2016   10:48 AM  Depression screen PHQ 2/9  Decreased Interest 0 0 0 0 0  Down, Depressed, Hopeless 0 0 0 0 0  PHQ - 2 Score 0 0 0 0 0  Altered sleeping 2 2 0    Tired, decreased energy 0 2 0    Change in appetite 0 0 0    Feeling bad or failure about yourself  0 0 0    Trouble concentrating 0 0 0    Moving slowly or fidgety/restless 0 0 0    Suicidal thoughts 0 0 0    PHQ-9 Score 2 4 0    Difficult doing work/chores Not difficult at all Not difficult at all Not difficult at all     Effexor  xr 150 mg daily - helped mood (was on hot flashes)  Trazodone  for sleep-not helping as much     Weight  On tirzepatide  65 u weekly  Is losing weight slowly    B12 def in setting of past bariatric surgery  Lab Results  Component Value Date   VITAMINB12 277 10/17/2023   Shots once per month  Misses them    Dm screen  Lab Results  Component  Value Date   HGBA1C 5.4 10/17/2023   HGBA1C 5.0 07/16/2022   Hyperlipidemia Lab Results  Component Value Date   CHOL 196 10/17/2023   CHOL 303 (H) 10/18/2022   CHOL 264 (H) 07/16/2022   Lab Results  Component Value Date   HDL 85.00 10/17/2023   HDL 98.10 10/18/2022   HDL 82.10 07/16/2022   Lab Results  Component Value Date   LDLCALC 94 10/17/2023   LDLCALC 177 (H) 10/18/2022   LDLCALC 163 (H) 07/16/2022   Lab Results  Component Value Date   TRIG 83.0 10/17/2023   TRIG 138.0 10/18/2022   TRIG 94.0 07/16/2022   Lab Results  Component Value Date   CHOLHDL 2 10/17/2023   CHOLHDL 3 10/18/2022   CHOLHDL 3 07/16/2022   Lab Results  Component Value Date   LDLDIRECT 164.6 04/09/2012   Crestor  10 mg daily  The 10-year ASCVD risk score (Arnett DK, et al., 2019) is: 1.6%   Values used to calculate the score:     Age: 58 years     Clincally relevant sex: Female     Is Non-Hispanic African American: No      Diabetic: No     Tobacco smoker: No     Systolic Blood Pressure: 124 mmHg     Is BP treated: No     HDL Cholesterol: 85 mg/dL     Total Cholesterol: 196 mg/dL   Lab Results  Component Value Date   NA 137 10/17/2023   K 3.8 10/17/2023   CO2 28 10/17/2023   GLUCOSE 84 10/17/2023   BUN 14 10/17/2023   CREATININE 0.62 10/17/2023   CALCIUM  9.4 10/17/2023   GFR 98.85 10/17/2023   GFRNONAA 72 10/13/2018   Lab Results  Component Value Date   ALT 37 (H) 10/17/2023   AST 29 10/17/2023   ALKPHOS 56 10/17/2023   BILITOT 0.9 10/17/2023   Lab Results  Component Value Date   WBC 5.8 10/17/2023   HGB 13.8 10/17/2023   HCT 40.2 10/17/2023   MCV 97.9 10/17/2023   PLT 303.0 10/17/2023   Lab Results  Component Value Date   TSH 2.95 10/17/2023             Patient Active Problem List   Diagnosis Date Noted   Diastolic dysfunction 09/13/2022   Aortic dilatation 09/13/2022   Shortness of breath 08/28/2022   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   Estrogen deficiency 05/30/2021   Bariatric surgery status 06/14/2020   B12 deficiency 06/14/2020   Vitamin D  deficiency 06/14/2020   Routine general medical examination at a health care facility 06/14/2020   Vasomotor symptoms due to menopause 06/14/2020   History of COVID-19 05/22/2020   BMI 40.0-44.9, adult (HCC) 06/02/2019   Colon cancer screening 06/11/2018   Knee pain, bilateral 02/24/2018   Diabetes mellitus screening 08/07/2016   Low back pain 12/27/2015   Lumbar degenerative disc disease 12/27/2015   S/P myomectomy 10/18/2013   Lupus anticoagulant positive 06/03/2012   Hyperlipidemia 11/04/2006   Attention deficit disorder 11/04/2006   Past Medical History:  Diagnosis Date   Actinic keratosis 09/01/2019   L lat breast - bx proven   Allergy    Arthritis    Edema, lower extremity    takes HCTZ as needed   Endometrial polyp    Exercise-induced asthma    per pt has used  inhaler in yrs   History of anemia    History of thrombophlebitis  per pt approx. 2015 right lower leg superficial    PONV (postoperative nausea and vomiting)    Uterine fibroid    Wears glasses    Past Surgical History:  Procedure Laterality Date   ANTERIOR FUSION LUMBAR SPINE  05-18-2007   dr unice / dr dyane  @[facility]    re-do diskectomy/ dissection L5 -S1 and fusion   APPENDECTOMY  age 40   COSMETIC SURGERY  1995   DILITATION & CURRETTAGE/HYSTROSCOPY WITH HYDROTHERMAL ABLATION N/A 10/30/2018   Procedure: DILATATION & CURETTAGE/HYSTEROSCOPY WITH HYDROTHERMAL ABLATION;  Surgeon: Mat Browning, MD;  Location: [facility] [place];  Service: Gynecology;  Laterality: N/A;   EYE SURGERY     HYSTEROSCOPY WITH D & C N/A 11/07/2017   Procedure: DILATATION AND CURETTAGE /HYSTEROSCOPY;  Surgeon: Mat Browning, MD;  Location: [facility] [place];  Service: Gynecology;  Laterality: N/A;   LAPAROTOMY  age 65   for ovarian cyst   LUMBAR DISC SURGERY  11-07-2006;  12-26-2006  dr unice @[facility]    L5 -- S1   MYOMECTOMY N/A 10/18/2013   Procedure: ABDOMINAL MYOMECTOMY;  Surgeon: Browning LITTIE Mat, MD;  Location: [facility];  Service: Gynecology;  Laterality: N/A;   REDUCTION MAMMAPLASTY Bilateral 1995   ROUX-EN-Y GASTRIC BYPASS  03-28-2003  dr gladis  @[facility]    SPINE SURGERY     TONSILLECTOMY  1972   TYMPANOSTOMY TUBE PLACEMENT Bilateral child   Social History   Tobacco Use   Smoking status: Never   Smokeless tobacco: Never  Vaping Use   Vaping status: Never Used  Substance Use Topics   Alcohol use: Yes    Comment: occasional   Drug use: Never   Family History  Problem Relation Age of Onset   Hypertension Mother    Hyperlipidemia Father    COPD Father    Sudden Cardiac Death Father    ADD / ADHD Father    Arthritis Father    Asthma Father    Ovarian cancer Maternal Aunt    Cancer Maternal Grandfather    Colon cancer Neg Hx    Colon polyps Neg Hx    Esophageal cancer Neg Hx     Rectal cancer Neg Hx    Stomach cancer Neg Hx    Breast cancer Neg Hx    Allergies  Allergen Reactions   Levofloxacin Other (See Comments) and Nausea Only    REACTION: abdiminal pain  High Dose 750 mg, ok with 500 mg dose   Tape Rash    blister   Current Outpatient Medications on File Prior to Visit  Medication Sig Dispense Refill   albuterol  (VENTOLIN  HFA) 108 (90 Base) MCG/ACT inhaler Inhale 2 puffs into the lungs every 4 (four) hours as needed for wheezing. 6.7 g 5   cyclobenzaprine  (FLEXERIL ) 10 MG tablet Take 0.5-1 tablets (5-10 mg total) by mouth 3 (three) times daily as needed for muscle spasms. 90 tablet 3   Fezolinetant  (VEOZAH ) 45 MG TABS Take 1 tablet (45 mg total) by mouth daily. 90 tablet 3   fluticasone  (FLOVENT  HFA) 110 MCG/ACT inhaler Inhale 1 puff by mouth into the lungs in the morning and at bedtime. 36 g 3   hydrochlorothiazide  (HYDRODIURIL ) 25 MG tablet Take 1 tablet (25 mg total) by mouth daily as needed. For Edema 30 tablet 0   levocetirizine (XYZAL ) 5 MG tablet Take 5 mg by mouth daily as needed.     meloxicam  (MOBIC ) 15 MG tablet Take 1 tablet (15 mg total) by mouth daily as  needed for pain and inflammation. 30 tablet 1   metroNIDAZOLE  (METROCREAM ) 0.75 % cream Apply to face 1-2 times daily. 45 g 2   oxybutynin  (DITROPAN ) 5 MG tablet Take 1 tablet (5 mg total) by mouth 2 (two) times a day. 90 tablet 6   rosuvastatin  (CRESTOR ) 10 MG tablet Take 1 tablet (10 mg total) by mouth daily. 90 tablet 3   TIRZEPATIDE   Inject 65 Units into the skin once a week. Tirzepatide  compound     traZODone  (DESYREL ) 50 MG tablet Take 0.5-1 tablets (25-50 mg total) by mouth at bedtime as needed for sleep 90 tablet 3   triamcinolone  cream (KENALOG ) 0.1 % Apply 1 Application topically 2 (two) times daily as needed. Avoid applying to face, groin, and axilla. Use as directed. Long-term use can cause thinning of the skin. 15 g 2   venlafaxine  XR (EFFEXOR -XR) 150 MG 24 hr capsule Take 1  capsule (150 mg total) by mouth daily. 90 capsule 3   No current facility-administered medications on file prior to visit.    Review of Systems  Constitutional:  Negative for activity change, appetite change, fatigue, fever and unexpected weight change.  HENT:  Negative for congestion, ear pain, rhinorrhea, sinus pressure and sore throat.   Eyes:  Negative for pain, redness and visual disturbance.  Respiratory:  Negative for cough, shortness of breath and wheezing.   Cardiovascular:  Negative for chest pain and palpitations.  Gastrointestinal:  Negative for abdominal pain, blood in stool, constipation and diarrhea.  Endocrine: Negative for polydipsia and polyuria.  Genitourinary:  Negative for dysuria, frequency and urgency.  Musculoskeletal:  Positive for arthralgias. Negative for back pain and myalgias.       Knee pain  In PT   Skin:  Negative for pallor and rash.  Allergic/Immunologic: Negative for environmental allergies.  Neurological:  Negative for dizziness, syncope and headaches.  Hematological:  Negative for adenopathy. Does not bruise/bleed easily.  Psychiatric/Behavioral:  Negative for decreased concentration and dysphoric mood. The patient is not nervous/anxious.        Objective:   Physical Exam Constitutional:      General: She is not in acute distress.    Appearance: Normal appearance. She is well-developed. She is obese. She is not ill-appearing or diaphoretic.  HENT:     Head: Normocephalic and atraumatic.     Right Ear: Tympanic membrane, ear canal and external ear normal.     Left Ear: Tympanic membrane, ear canal and external ear normal.     Nose: Nose normal. No congestion.     Mouth/Throat:     Mouth: Mucous membranes are moist.     Pharynx: Oropharynx is clear. No posterior oropharyngeal erythema.  Eyes:     General: No scleral icterus.    Extraocular Movements: Extraocular movements intact.     Conjunctiva/sclera: Conjunctivae normal.     Pupils:  Pupils are equal, round, and reactive to light.  Neck:     Thyroid: No thyromegaly.     Vascular: No carotid bruit or JVD.  Cardiovascular:     Rate and Rhythm: Normal rate and regular rhythm.     Pulses: Normal pulses.     Heart sounds: Normal heart sounds.     No gallop.  Pulmonary:     Effort: Pulmonary effort is normal. No respiratory distress.     Breath sounds: Normal breath sounds. No wheezing.     Comments: Good air exch Chest:     Chest wall: No tenderness.  Abdominal:     General: Bowel sounds are normal. There is no distension or abdominal bruit.     Palpations: Abdomen is soft. There is no mass.     Tenderness: There is no abdominal tenderness.     Hernia: No hernia is present.  Genitourinary:    Comments: Breast and pelvic exam are done by gyn provider   Musculoskeletal:        General: No tenderness. Normal range of motion.     Cervical back: Normal range of motion and neck supple. No rigidity. No muscular tenderness.     Right lower leg: No edema.     Left lower leg: No edema.     Comments: No kyphosis   Lymphadenopathy:     Cervical: No cervical adenopathy.  Skin:    General: Skin is warm and dry.     Coloration: Skin is not pale.     Findings: No erythema or rash.     Comments: Fair  Solar lentigines diffusely   Neurological:     Mental Status: She is alert. Mental status is at baseline.     Cranial Nerves: No cranial nerve deficit.     Motor: No abnormal muscle tone.     Coordination: Coordination normal.     Gait: Gait normal.     Deep Tendon Reflexes: Reflexes are normal and symmetric. Reflexes normal.  Psychiatric:        Mood and Affect: Mood normal.        Cognition and Memory: Cognition and memory normal.           Assessment & Plan:   Problem List Items Addressed This Visit       Other   Vitamin D  deficiency   Last vitamin D  Lab Results  Component Value Date   VD25OH 20.54 (L) 10/17/2023   Encouraged to take D3 more regularly   For bone and overall health      Vasomotor symptoms due to menopause   Some improvement with  Effexor  xr 150 mg daily  Veozah  45 mg daily  Trazodone  for sleep-can try increasing to 100 mg prn , watching for signs and symptoms of seretonin syndrome       Routine general medical examination at a health care facility - Primary   Reviewed health habits including diet and exercise and skin cancer prevention Reviewed appropriate screening tests for age  Also reviewed health mt list, fam hx and immunization status , as well as social and family history   See HPI Labs reviewed and ordered Health Maintenance  Topic Date Due   Hepatitis C Screening  Never done   Hepatitis B Vaccine (1 of 3 - 19+ 3-dose series) Never done   Pneumococcal Vaccine for age over 52 (1 of 1 - PCV) Never done   Flu Shot  04/13/2024*   COVID-19 Vaccine (6 - 2025-26 season) 11/06/2025*   Pap with HPV screening  12/28/2024   Breast Cancer Screening  09/23/2025   DTaP/Tdap/Td vaccine (3 - Td or Tdap) 08/08/2026   Colon Cancer Screening  07/14/2028   HIV Screening  Completed   Zoster (Shingles) Vaccine  Completed   HPV Vaccine  Aged Out   Meningitis B Vaccine  Aged Out  *Topic was postponed. The date shown is not the original due date.    Planning flu shot at work  Gyn care with Dr Linette upcoming  Dexa also  Discussed fall prevention, supplements and exercise for bone density  PHQ 2  Hyperlipidemia   Disc goals for lipids and reasons to control them Rev last labs with pt Rev low sat fat diet in detail LDL down to 94 with crestor  10 mg daily  Tolerating well Good diet       Colon cancer screening   Colonoscopy 07/2018       BMI 40.0-44.9, adult (HCC)   Continues tirzepatide  65 u weekly  Working on diet /exercise  Encouraged getting back to gym/strength training       Relevant Medications   TIRZEPATIDE  [place]   Bariatric surgery status   Lab Results  Component Value Date    VITAMINB12 277 10/17/2023   Last vitamin D  Lab Results  Component Value Date   VD25OH 20.54 (L) 10/17/2023   Normal H/ H       B12 deficiency   Lab Results  Component Value Date   VITAMINB12 277 10/17/2023   Encouraged to be more compliant with monthly shots since it is in low normal range May help energy level to be a bit higher

## 2023-10-22 NOTE — Assessment & Plan Note (Signed)
 Some improvement with  Effexor  xr 150 mg daily  Veozah  45 mg daily  Trazodone  for sleep-can try increasing to 100 mg prn , watching for signs and symptoms of seretonin syndrome

## 2023-10-22 NOTE — Assessment & Plan Note (Signed)
 Last vitamin D  Lab Results  Component Value Date   VD25OH 20.54 (L) 10/17/2023   Encouraged to take D3 more regularly  For bone and overall health

## 2023-10-22 NOTE — Assessment & Plan Note (Signed)
 Lab Results  Component Value Date   VITAMINB12 277 10/17/2023   Encouraged to be more compliant with monthly shots since it is in low normal range May help energy level to be a bit higher

## 2023-10-22 NOTE — Assessment & Plan Note (Signed)
Colonoscopy 07/2018

## 2023-10-28 ENCOUNTER — Other Ambulatory Visit: Payer: Self-pay | Admitting: Family Medicine

## 2023-10-29 ENCOUNTER — Other Ambulatory Visit: Payer: Self-pay

## 2023-10-29 ENCOUNTER — Other Ambulatory Visit (HOSPITAL_BASED_OUTPATIENT_CLINIC_OR_DEPARTMENT_OTHER): Payer: Self-pay

## 2023-10-29 DIAGNOSIS — S83282D Other tear of lateral meniscus, current injury, left knee, subsequent encounter: Secondary | ICD-10-CM | POA: Diagnosis not present

## 2023-10-29 DIAGNOSIS — S83512D Sprain of anterior cruciate ligament of left knee, subsequent encounter: Secondary | ICD-10-CM | POA: Diagnosis not present

## 2023-10-29 MED ORDER — HYDROCHLOROTHIAZIDE 25 MG PO TABS
25.0000 mg | ORAL_TABLET | Freq: Every day | ORAL | 3 refills | Status: AC | PRN
  Filled 2023-10-29: qty 30, 30d supply, fill #0

## 2023-10-31 ENCOUNTER — Encounter: Payer: Self-pay | Admitting: Family Medicine

## 2023-11-03 ENCOUNTER — Other Ambulatory Visit: Payer: Self-pay

## 2023-11-03 ENCOUNTER — Other Ambulatory Visit (HOSPITAL_BASED_OUTPATIENT_CLINIC_OR_DEPARTMENT_OTHER): Payer: Self-pay

## 2023-11-03 ENCOUNTER — Other Ambulatory Visit: Payer: Self-pay | Admitting: Family Medicine

## 2023-11-03 MED ORDER — FLUTICASONE PROPIONATE HFA 110 MCG/ACT IN AERO
1.0000 | INHALATION_SPRAY | Freq: Two times a day (BID) | RESPIRATORY_TRACT | 3 refills | Status: AC
  Filled 2023-11-03: qty 36, 90d supply, fill #0

## 2023-11-19 ENCOUNTER — Other Ambulatory Visit: Payer: Self-pay | Admitting: Cardiovascular Disease

## 2023-11-19 ENCOUNTER — Other Ambulatory Visit: Payer: Self-pay

## 2023-11-20 ENCOUNTER — Other Ambulatory Visit (HOSPITAL_BASED_OUTPATIENT_CLINIC_OR_DEPARTMENT_OTHER): Payer: Self-pay

## 2023-11-20 MED ORDER — ROSUVASTATIN CALCIUM 10 MG PO TABS
10.0000 mg | ORAL_TABLET | Freq: Every day | ORAL | 0 refills | Status: DC
  Filled 2023-11-20: qty 30, 30d supply, fill #0

## 2023-11-27 ENCOUNTER — Other Ambulatory Visit (HOSPITAL_BASED_OUTPATIENT_CLINIC_OR_DEPARTMENT_OTHER): Payer: Self-pay

## 2023-11-27 ENCOUNTER — Other Ambulatory Visit: Payer: Self-pay | Admitting: Family Medicine

## 2023-11-27 MED ORDER — AZELAIC ACID 15 % EX GEL
1.0000 | Freq: Every day | CUTANEOUS | 3 refills | Status: AC
  Filled 2023-11-27: qty 50, 30d supply, fill #0
  Filled 2024-01-06: qty 50, 30d supply, fill #1

## 2023-11-28 ENCOUNTER — Other Ambulatory Visit (HOSPITAL_BASED_OUTPATIENT_CLINIC_OR_DEPARTMENT_OTHER): Payer: Self-pay

## 2023-11-28 MED ORDER — TRAZODONE HCL 50 MG PO TABS
25.0000 mg | ORAL_TABLET | Freq: Every evening | ORAL | 3 refills | Status: DC | PRN
  Filled 2023-11-28: qty 90, 90d supply, fill #0

## 2023-11-28 NOTE — Telephone Encounter (Signed)
 Last filled on 11/11/22 #90 tab/ 3 refills  CPE was on 10/22/23

## 2023-12-01 ENCOUNTER — Telehealth: Payer: Self-pay | Admitting: Family Medicine

## 2023-12-01 ENCOUNTER — Other Ambulatory Visit (HOSPITAL_BASED_OUTPATIENT_CLINIC_OR_DEPARTMENT_OTHER): Payer: Self-pay

## 2023-12-01 MED ORDER — TRAZODONE HCL 50 MG PO TABS
50.0000 mg | ORAL_TABLET | Freq: Every evening | ORAL | 3 refills | Status: AC | PRN
  Filled 2023-12-01 (×2): qty 180, 90d supply, fill #0

## 2023-12-01 NOTE — Telephone Encounter (Signed)
 Refill trazodone

## 2023-12-03 ENCOUNTER — Other Ambulatory Visit (HOSPITAL_BASED_OUTPATIENT_CLINIC_OR_DEPARTMENT_OTHER): Payer: Self-pay

## 2023-12-10 ENCOUNTER — Other Ambulatory Visit: Payer: Self-pay | Admitting: Family Medicine

## 2023-12-10 ENCOUNTER — Telehealth: Payer: Self-pay | Admitting: Family Medicine

## 2023-12-10 MED ORDER — METHYLPREDNISOLONE 4 MG PO TABS: ORAL_TABLET | ORAL | 0 refills | Status: DC

## 2023-12-10 NOTE — Telephone Encounter (Signed)
 Patient with a long history of end stage arthritis in left knee is awaiting surgery but now has developed left hip pain secondary to limping. Likely bursitis that is painful enough to cause her difficulty walking. Will call in a medrol  dose pak for patient to take and patient will report if pain does not improve.

## 2023-12-15 ENCOUNTER — Other Ambulatory Visit (HOSPITAL_BASED_OUTPATIENT_CLINIC_OR_DEPARTMENT_OTHER): Payer: Self-pay

## 2023-12-20 ENCOUNTER — Other Ambulatory Visit: Payer: Self-pay | Admitting: Cardiovascular Disease

## 2023-12-22 ENCOUNTER — Other Ambulatory Visit: Payer: Self-pay

## 2023-12-23 ENCOUNTER — Other Ambulatory Visit (HOSPITAL_BASED_OUTPATIENT_CLINIC_OR_DEPARTMENT_OTHER): Payer: Self-pay

## 2023-12-23 MED ORDER — ROSUVASTATIN CALCIUM 10 MG PO TABS
10.0000 mg | ORAL_TABLET | Freq: Every day | ORAL | 0 refills | Status: AC
  Filled 2023-12-23: qty 90, 90d supply, fill #0

## 2023-12-31 DIAGNOSIS — S83282A Other tear of lateral meniscus, current injury, left knee, initial encounter: Secondary | ICD-10-CM | POA: Diagnosis not present

## 2023-12-31 DIAGNOSIS — S83512A Sprain of anterior cruciate ligament of left knee, initial encounter: Secondary | ICD-10-CM | POA: Diagnosis not present

## 2024-01-16 ENCOUNTER — Other Ambulatory Visit (HOSPITAL_BASED_OUTPATIENT_CLINIC_OR_DEPARTMENT_OTHER): Payer: Self-pay

## 2024-01-17 ENCOUNTER — Other Ambulatory Visit: Payer: Self-pay

## 2024-01-19 ENCOUNTER — Other Ambulatory Visit (HOSPITAL_BASED_OUTPATIENT_CLINIC_OR_DEPARTMENT_OTHER): Payer: Self-pay

## 2024-01-20 ENCOUNTER — Other Ambulatory Visit: Payer: Self-pay

## 2024-01-23 ENCOUNTER — Other Ambulatory Visit: Payer: Self-pay

## 2024-01-26 ENCOUNTER — Other Ambulatory Visit (HOSPITAL_COMMUNITY): Payer: Self-pay

## 2024-01-27 ENCOUNTER — Other Ambulatory Visit: Payer: Self-pay

## 2024-02-10 ENCOUNTER — Telehealth: Payer: Self-pay | Admitting: Internal Medicine

## 2024-02-10 ENCOUNTER — Other Ambulatory Visit (INDEPENDENT_AMBULATORY_CARE_PROVIDER_SITE_OTHER)

## 2024-02-10 ENCOUNTER — Other Ambulatory Visit: Payer: Self-pay

## 2024-02-10 ENCOUNTER — Encounter (HOSPITAL_BASED_OUTPATIENT_CLINIC_OR_DEPARTMENT_OTHER): Payer: Self-pay | Admitting: Orthopedic Surgery

## 2024-02-10 DIAGNOSIS — Z01818 Encounter for other preprocedural examination: Secondary | ICD-10-CM | POA: Diagnosis not present

## 2024-02-10 NOTE — Addendum Note (Signed)
 Addended by: WELLS LEVORN HERO on: 02/10/2024 03:25 PM   Modules accepted: Orders

## 2024-02-10 NOTE — Telephone Encounter (Signed)
 Needs BMP, requested by orthopedics for preop.  Order placed

## 2024-02-11 ENCOUNTER — Ambulatory Visit: Payer: Self-pay | Admitting: Internal Medicine

## 2024-02-11 ENCOUNTER — Telehealth (INDEPENDENT_AMBULATORY_CARE_PROVIDER_SITE_OTHER): Admitting: Internal Medicine

## 2024-02-11 DIAGNOSIS — J40 Bronchitis, not specified as acute or chronic: Secondary | ICD-10-CM

## 2024-02-11 DIAGNOSIS — J4521 Mild intermittent asthma with (acute) exacerbation: Secondary | ICD-10-CM

## 2024-02-11 LAB — BASIC METABOLIC PANEL WITH GFR
BUN: 10 mg/dL (ref 6–23)
CO2: 22 meq/L (ref 19–32)
Calcium: 8.8 mg/dL (ref 8.4–10.5)
Chloride: 107 meq/L (ref 96–112)
Creatinine, Ser: 0.67 mg/dL (ref 0.40–1.20)
GFR: 96.8 mL/min
Glucose, Bld: 91 mg/dL (ref 70–99)
Potassium: 4.4 meq/L (ref 3.5–5.1)
Sodium: 138 meq/L (ref 135–145)

## 2024-02-11 MED ORDER — AZITHROMYCIN 250 MG PO TABS: ORAL_TABLET | ORAL | 0 refills | Status: DC

## 2024-02-11 MED ORDER — HYDROCODONE BIT-HOMATROP MBR 5-1.5 MG/5ML PO SOLN: 5.0000 mL | Freq: Every evening | ORAL | 0 refills | Status: AC | PRN

## 2024-02-11 MED ORDER — AZITHROMYCIN 250 MG PO TABS: ORAL_TABLET | ORAL | 0 refills | Status: AC

## 2024-02-11 NOTE — Progress Notes (Signed)
 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   MRN: [id number]  DOS:  02/11/2024   Virtual Visit via Video Note  I connected with the above patient  by a video enabled telemedicine application and verified that I am speaking with the correct person using two identifiers.   Location of patient: home  Location of provider: office  Persons participating in the virtual visit: patient, provider   I discussed the limitations of evaluation and management by telemedicine and the availability of in person appointments. The patient expressed understanding and agreed to proceed.  HPI: Symptoms started 2 days ago with cough, sinus and chest congestion, she has asthma and had some wheezing at first, that is better. No fever or chills. Has chest pain with cough only.   Review of Systems See above   Past Medical History:  Diagnosis Date   Actinic keratosis 09/01/2019   L lat breast - bx proven   Allergy    Arthritis    Edema, lower extremity    takes HCTZ as needed   Endometrial polyp    Exercise-induced asthma    per pt has used inhaler in yrs   History of anemia    History of thrombophlebitis    per pt approx. 2015 right lower leg superficial    PONV (postoperative nausea and vomiting)    Uterine fibroid    Wears glasses     Past Surgical History:  Procedure Laterality Date   ANTERIOR FUSION LUMBAR SPINE  05-18-2007   dr unice / dr dyane  @[facility]    re-do diskectomy/ dissection L5 -S1 and fusion   APPENDECTOMY  age 28   COSMETIC SURGERY  1995   DILITATION & CURRETTAGE/HYSTROSCOPY WITH HYDROTHERMAL ABLATION N/A 10/30/2018   Procedure: DILATATION & CURETTAGE/HYSTEROSCOPY WITH HYDROTHERMAL ABLATION;  Surgeon: Mat Browning, MD;  Location: [facility];  Service: Gynecology;  Laterality: N/A;   EYE SURGERY     HYSTEROSCOPY WITH D & C N/A 11/07/2017   Procedure: DILATATION AND CURETTAGE /HYSTEROSCOPY;  Surgeon: Mat Browning, MD;  Location: [facility];  Service: Gynecology;  Laterality: N/A;   LAPAROTOMY  age 29   for ovarian cyst   LUMBAR DISC SURGERY  11-07-2006;  12-26-2006  dr unice @[facility]    L5 -- S1   MYOMECTOMY N/A 10/18/2013   Procedure: ABDOMINAL MYOMECTOMY;  Surgeon: Browning LITTIE Mat, MD;  Location: [facility];  Service: Gynecology;  Laterality: N/A;   REDUCTION MAMMAPLASTY Bilateral 1995   ROUX-EN-Y GASTRIC BYPASS  03-28-2003  dr gladis  @[facility]    SPINE SURGERY     TONSILLECTOMY  1972   TYMPANOSTOMY TUBE PLACEMENT Bilateral child    Current Outpatient Medications  Medication Instructions   albuterol  (VENTOLIN  HFA) 108 (90 Base) MCG/ACT inhaler 2 puffs, Inhalation, Every 4 hours PRN   Azelaic Acid  15 % gel Apply topically once daily for 30 days.   cyanocobalamin  (VITAMIN B12) 1,000 mcg, Intramuscular, Every 30 days   cyclobenzaprine  (FLEXERIL ) 5-10 mg, Oral, 3 times daily PRN   fluticasone  (FLOVENT  HFA) 110 MCG/ACT inhaler Inhale 1 puff by mouth into the lungs in the morning and at bedtime.   hydrochlorothiazide  (HYDRODIURIL ) 25 MG tablet Take 1 tablet (25 mg total) by mouth daily as needed for edema.   levocetirizine (XYZAL ) 5 mg, Daily PRN   meloxicam  (MOBIC ) 15 MG tablet Take 1 tablet (15 mg total) by mouth daily as needed for pain and  inflammation.   methylPREDNISolone  (MEDROL ) 4 MG tablet 5 tabs po x 3 day then 4 tabs po x 3 day then 3 tabs po x 3 day then 2 tabs po x 3 day then 1 tab po x 3 day and stop   metroNIDAZOLE  (METROCREAM ) 0.75 % cream Apply to face 1-2 times daily.   oxybutynin  (DITROPAN ) 5 MG tablet Take 1 tablet (5 mg total) by mouth 2 (two) times a day.   rosuvastatin  (CRESTOR ) 10 mg, Oral, Daily, *Need appt*   TIRZEPATIDE  [place] 65 Units, Weekly   traZODone  (DESYREL ) 50-100 mg, Oral, At bedtime PRN   triamcinolone  cream (KENALOG ) 0.1 % 1 Application, Topical, 2 times daily PRN, Avoid applying to face, groin, and axilla. Use as directed. Long-term use can cause thinning  of the skin.   venlafaxine  XR (EFFEXOR -XR) 150 mg, Oral, Daily   Veozah  45 mg, Oral, Daily       Objective:   Physical Exam LMP 10/15/2018  Alert oriented x 3, in no distress.  Speaking in complete sentences.  No vital signs available    Assessment    Bronchitis, mild asthma exacerbation: Symptoms started 2 days ago, had some wheezing at first, that has improved with her routine asthma medicines. Tested negative for COVID and flu at home. She has a scheduled surgery in a couple of days. Plan:  Lots of fluids, continue Robitussin or Mucinex DM, Z-Pak.  Cough control with low-dose of hydrocodone , PDMP okay prescription sent. Let me know if is not better Keep surgical team aware.   I discussed the assessment and treatment plan with the patient. The patient was provided an opportunity to ask questions and all were answered. The patient agreed with the plan and demonstrated an understanding of the instructions.   The patient was advised to call back or seek an in-person evaluation if the symptoms worsen or if the condition fails to improve as anticipated.    "

## 2024-02-13 ENCOUNTER — Ambulatory Visit (HOSPITAL_BASED_OUTPATIENT_CLINIC_OR_DEPARTMENT_OTHER): Admitting: Certified Registered"

## 2024-02-13 ENCOUNTER — Other Ambulatory Visit: Payer: Self-pay

## 2024-02-13 ENCOUNTER — Encounter (HOSPITAL_BASED_OUTPATIENT_CLINIC_OR_DEPARTMENT_OTHER): Payer: Self-pay | Admitting: Orthopedic Surgery

## 2024-02-13 ENCOUNTER — Encounter (HOSPITAL_BASED_OUTPATIENT_CLINIC_OR_DEPARTMENT_OTHER)
Admission: RE | Disposition: A | Discharge status: Home / Self Care | Payer: Self-pay | Source: Home / Self Care | Attending: Orthopedic Surgery

## 2024-02-13 ENCOUNTER — Ambulatory Visit (HOSPITAL_BASED_OUTPATIENT_CLINIC_OR_DEPARTMENT_OTHER)
Admission: RE | Admit: 2024-02-13 | Discharge: 2024-02-13 | Disposition: A | Discharge status: Home / Self Care | Attending: Orthopedic Surgery | Admitting: Orthopedic Surgery

## 2024-02-13 DIAGNOSIS — M94262 Chondromalacia, left knee: Secondary | ICD-10-CM | POA: Insufficient documentation

## 2024-02-13 DIAGNOSIS — J45909 Unspecified asthma, uncomplicated: Secondary | ICD-10-CM | POA: Insufficient documentation

## 2024-02-13 DIAGNOSIS — Z01818 Encounter for other preprocedural examination: Secondary | ICD-10-CM

## 2024-02-13 DIAGNOSIS — S83512A Sprain of anterior cruciate ligament of left knee, initial encounter: Secondary | ICD-10-CM | POA: Insufficient documentation

## 2024-02-13 DIAGNOSIS — S83282A Other tear of lateral meniscus, current injury, left knee, initial encounter: Secondary | ICD-10-CM

## 2024-02-13 DIAGNOSIS — Z9884 Bariatric surgery status: Secondary | ICD-10-CM | POA: Insufficient documentation

## 2024-02-13 DIAGNOSIS — X58XXXA Exposure to other specified factors, initial encounter: Secondary | ICD-10-CM | POA: Insufficient documentation

## 2024-02-13 DIAGNOSIS — E785 Hyperlipidemia, unspecified: Secondary | ICD-10-CM

## 2024-02-13 DIAGNOSIS — R0602 Shortness of breath: Secondary | ICD-10-CM | POA: Diagnosis not present

## 2024-02-13 DIAGNOSIS — M65962 Unspecified synovitis and tenosynovitis, left lower leg: Secondary | ICD-10-CM | POA: Insufficient documentation

## 2024-02-13 DIAGNOSIS — Z79899 Other long term (current) drug therapy: Secondary | ICD-10-CM | POA: Diagnosis not present

## 2024-02-13 DIAGNOSIS — M1990 Unspecified osteoarthritis, unspecified site: Secondary | ICD-10-CM | POA: Insufficient documentation

## 2024-02-13 DIAGNOSIS — Z7951 Long term (current) use of inhaled steroids: Secondary | ICD-10-CM | POA: Insufficient documentation

## 2024-02-13 DIAGNOSIS — S83252A Bucket-handle tear of lateral meniscus, current injury, left knee, initial encounter: Secondary | ICD-10-CM | POA: Diagnosis present

## 2024-02-13 MED ORDER — CEFAZOLIN SODIUM-DEXTROSE 2-4 GM/100ML-% IV SOLN
INTRAVENOUS | Status: AC
  Filled 2024-02-13: qty 100

## 2024-02-13 MED ORDER — OXYCODONE HCL 5 MG/5ML PO SOLN: 5.0000 mg | Freq: Once | ORAL | Status: AC | PRN

## 2024-02-13 MED ORDER — FENTANYL CITRATE (PF) 100 MCG/2ML IJ SOLN: 25.0000 ug | INTRAMUSCULAR | Status: DC | PRN

## 2024-02-13 MED ORDER — BUPIVACAINE HCL 0.25 % IJ SOLN
INTRAMUSCULAR | Status: DC | PRN
  Administered 2024-02-13: 20 mL

## 2024-02-13 MED ORDER — OXYCODONE HCL 5 MG PO TABS
ORAL_TABLET | ORAL | Status: AC
  Filled 2024-02-13: qty 1

## 2024-02-13 MED ORDER — FENTANYL CITRATE (PF) 100 MCG/2ML IJ SOLN
INTRAMUSCULAR | Status: AC
  Filled 2024-02-13: qty 2

## 2024-02-13 MED ORDER — SODIUM CHLORIDE 0.9 % IR SOLN
Status: DC | PRN
  Administered 2024-02-13: 3000 mL

## 2024-02-13 MED ORDER — ONDANSETRON HCL 4 MG/2ML IJ SOLN: 4.0000 mg | Freq: Four times a day (QID) | INTRAMUSCULAR | Status: DC | PRN

## 2024-02-13 MED ORDER — LIDOCAINE 2% (20 MG/ML) 5 ML SYRINGE
INTRAMUSCULAR | Status: DC | PRN
  Administered 2024-02-13: 60 mg via INTRAVENOUS

## 2024-02-13 MED ORDER — LACTATED RINGERS IV SOLN: INTRAVENOUS | Status: DC

## 2024-02-13 MED ORDER — MIDAZOLAM HCL 2 MG/2ML IJ SOLN
INTRAMUSCULAR | Status: AC
  Filled 2024-02-13: qty 2

## 2024-02-13 MED ORDER — OXYCODONE HCL 5 MG PO TABS
5.0000 mg | ORAL_TABLET | Freq: Once | ORAL | Status: AC | PRN
  Administered 2024-02-13: 5 mg via ORAL

## 2024-02-13 MED ORDER — DEXAMETHASONE SOD PHOSPHATE PF 10 MG/ML IJ SOLN
INTRAMUSCULAR | Status: DC | PRN
  Administered 2024-02-13: 5 mg via INTRAVENOUS

## 2024-02-13 MED ORDER — HYDROCODONE-ACETAMINOPHEN 5-325 MG PO TABS: 1.0000 | ORAL_TABLET | Freq: Four times a day (QID) | ORAL | 0 refills | Status: AC | PRN

## 2024-02-13 MED ORDER — ONDANSETRON 4 MG PO TBDP: 4.0000 mg | ORAL_TABLET | Freq: Three times a day (TID) | ORAL | 0 refills | Status: AC | PRN

## 2024-02-13 MED ORDER — PROPOFOL 10 MG/ML IV BOLUS
INTRAVENOUS | Status: DC | PRN
  Administered 2024-02-13: 150 mg via INTRAVENOUS

## 2024-02-13 MED ORDER — CEFAZOLIN SODIUM-DEXTROSE 2-4 GM/100ML-% IV SOLN
2.0000 g | INTRAVENOUS | Status: AC
  Administered 2024-02-13: 2 g via INTRAVENOUS

## 2024-02-13 MED ORDER — FENTANYL CITRATE (PF) 100 MCG/2ML IJ SOLN
INTRAMUSCULAR | Status: DC | PRN
  Administered 2024-02-13: 25 ug via INTRAVENOUS
  Administered 2024-02-13: 50 ug via INTRAVENOUS
  Administered 2024-02-13: 25 ug via INTRAVENOUS

## 2024-02-13 MED ORDER — LIDOCAINE 2% (20 MG/ML) 5 ML SYRINGE
INTRAMUSCULAR | Status: AC
  Filled 2024-02-13: qty 5

## 2024-02-13 MED ORDER — PROPOFOL 10 MG/ML IV BOLUS
INTRAVENOUS | Status: AC
  Filled 2024-02-13: qty 20

## 2024-02-13 MED ORDER — ONDANSETRON HCL 4 MG/2ML IJ SOLN
INTRAMUSCULAR | Status: DC | PRN
  Administered 2024-02-13: 4 mg via INTRAVENOUS

## 2024-02-13 MED ORDER — MIDAZOLAM HCL (PF) 2 MG/2ML IJ SOLN
INTRAMUSCULAR | Status: DC | PRN
  Administered 2024-02-13: 2 mg via INTRAVENOUS

## 2024-02-13 MED ORDER — EPHEDRINE SULFATE-NACL 50-0.9 MG/10ML-% IV SOSY
PREFILLED_SYRINGE | INTRAVENOUS | Status: DC | PRN
  Administered 2024-02-13: 10 mg via INTRAVENOUS

## 2024-02-13 NOTE — Anesthesia Preprocedure Evaluation (Signed)
"                                    Anesthesia Evaluation  Patient identified by MRN, date of birth, ID band Patient awake    Reviewed: Allergy & Precautions, H&P , NPO status , Patient's Chart, lab work & pertinent test results  History of Anesthesia Complications (+) PONV and history of anesthetic complications  Airway Mallampati: II   Neck ROM: full    Dental   Pulmonary shortness of breath, asthma    breath sounds clear to auscultation       Cardiovascular negative cardio ROS  Rhythm:regular Rate:Normal     Neuro/Psych  Neuromuscular disease    GI/Hepatic H/o gastric bypass   Endo/Other    Renal/GU      Musculoskeletal  (+) Arthritis ,    Abdominal   Peds  Hematology   Anesthesia Other Findings   Reproductive/Obstetrics                              Anesthesia Physical Anesthesia Plan  ASA: 3  Anesthesia Plan: General   Post-op Pain Management:    Induction: Intravenous  PONV Risk Score and Plan: 4 or greater and Ondansetron , Dexamethasone , Midazolam , Scopolamine  patch - Pre-op and Treatment may vary due to age or medical condition  Airway Management Planned: LMA  Additional Equipment:   Intra-op Plan:   Post-operative Plan: Extubation in OR  Informed Consent: I have reviewed the patients History and Physical, chart, labs and discussed the procedure including the risks, benefits and alternatives for the proposed anesthesia with the patient or authorized representative who has indicated his/her understanding and acceptance.     Dental advisory given  Plan Discussed with: CRNA, Anesthesiologist and Surgeon  Anesthesia Plan Comments:         Anesthesia Quick Evaluation  "

## 2024-02-13 NOTE — Brief Op Note (Signed)
 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  Left knee lateral meniscus tear 2.  Left knee anterior cruciate ligament tear  PROCEDURE:  Procedures: ARTHROSCOPY, KNEE, WITH LATERAL MENISCECTOMY (Left)  SURGEON:  Surgeons and Role:    * Sharl Selinda Dover, MD - Primary  PHYSICIAN ASSISTANT:  Dayle Moores, PA-C  ANESTHESIA:   local and general  EBL:  15 cc   BLOOD ADMINISTERED:none  DRAINS: none   LOCAL MEDICATIONS USED:  MARCAINE      SPECIMEN:  No Specimen  DISPOSITION OF SPECIMEN:  N/A  COUNTS:  YES  TOURNIQUET:  * No tourniquets in log *  DICTATION: .Note written in EPIC  PLAN OF CARE: Discharge to home after PACU  PATIENT DISPOSITION:  PACU - hemodynamically stable.   Delay start of Pharmacological VTE agent (>24hrs) due to surgical blood loss or risk of bleeding: not applicable

## 2024-02-13 NOTE — Transfer of Care (Signed)
 Immediate Anesthesia Transfer of Care Note  Patient: Madeline JONELLE Schultze, MD  Procedure(s) Performed: Procedures (LRB): ARTHROSCOPY, KNEE, WITH LATERAL MENISCECTOMY (Left)  Patient Location: PACU  Anesthesia Type: General  Level of Consciousness: awake, oriented, sedated and patient cooperative  Airway & Oxygen Therapy: Patient Spontanous Breathing and Patient connected to face mask oxygen  Post-op Assessment: Report given to PACU RN and Post -op Vital signs reviewed and stable  Post vital signs: Reviewed and stable  Complications: No apparent anesthesia complications Last Vitals:  Vitals Value Taken Time  BP    Temp    Pulse 80 02/13/24 10:33  Resp 12 02/13/24 10:33  SpO2 98 % 02/13/24 10:33  Vitals shown include unfiled device data.  Last Pain:  Vitals:   02/13/24 0914  TempSrc: Temporal  PainSc: 0-No pain      Patients Stated Pain Goal: 6 (02/13/24 0914)  Complications: No notable events documented.

## 2024-02-13 NOTE — Discharge Instructions (Addendum)
 Post-operative patient instructions  Knee Arthroscopy   Ice:  Place intermittent ice or cooler pack over your knee, 30 minutes on and 30 minutes off.  Continue this for the first 72 hours after surgery, then save ice for use after therapy sessions or on more active days.   Weight:  You may bear weight on your leg as your symptoms allow. DVT prevention: Perform ankle pumps as able throughout the day on the operative extremity.  Be mobile as possible with ambulation as able.  You should also take an 81 mg aspirin once per day x6 weeks. Crutches:  Use crutches (or walker) to assist in walking until told to discontinue by your physical therapist or physician. This will help to reduce pain. Strengthening:  Perform simple thigh squeezes (isometric quad contractions) and straight leg lifts as you are able (3 sets of 5 to 10 repetitions, 3 times a day).  For the leg lifts, have someone support under your ankle in the beginning until you have increased strength enough to do this on your own.  To help get started on thigh squeezes, place a pillow under your knee and push down on the pillow with back of knee (sometimes easier to do than with your leg fully straight). Motion:  Perform gentle knee motion as tolerated - this is gentle bending and straightening of the knee. Seated heel slides: you can start by sitting in a chair, remove your brace, and gently slide your heel back on the floor - allowing your knee to bend. Have someone help you straighten your knee (or use your other leg/foot hooked under your ankle.  Dressing:  Perform 1st dressing change at 3 days postoperative. A moderate amount of blood tinged drainage is to be expected.  So if you bleed through the dressing on the first or second day or if you have fevers, it is fine to change the dressing/check the wounds early and redress wound. Elevate your leg.  If it bleeds through again, or if the incisions are leaking frank blood, please call the office. May  change dressing every 1-2 days thereafter to help watch wounds. Can purchase Tegderm (or 67M Nexcare) water resistant dressings at local pharmacy / [employer]. Shower:  Light shower is ok after 3 days.  Please take shower, NO bath. Recover with gauze and ace wrap to help keep wounds protected.   Pain medication:  A narcotic pain medication has been prescribed.  Take as directed.  Typically you need narcotic pain medication more regularly during the first 3 to 5 days after surgery.  Decrease your use of the medication as the pain improves.  Narcotics can sometimes cause constipation, even after a few doses.  If you have problems with constipation, you can take an over the counter stool softener or light laxative.  If you have persistent problems, please notify your physician's office. Physical therapy: Additional activity guidelines to be provided by your physician or physical therapist at follow-up visits.  Driving: Do not recommend driving x 1-2 weeks post surgical, especially if surgery performed on right side. Should not drive while taking narcotic pain medications. It typically takes at least 2 weeks to restore sufficient neuromuscular function for normal reaction times for driving safety.  Call [phone] for questions or problems. Evenings you will be forwarded to the hospital operator.  Ask for the orthopaedic physician on call. Please call if you experience:    Redness, foul smelling, or persistent drainage from the surgical site  worsening knee pain and  swelling not responsive to medication  any calf pain and or swelling of the lower leg  temperatures greater than 101.5 F other questions or concerns   Thank you for allowing us  to be a part of your care    Post Anesthesia Home Care Instructions  Activity: Get plenty of rest for the remainder of the day. A responsible individual must stay with you for 24 hours following the procedure.  For the next 24 hours, DO NOT: -Drive a car -Social worker -Drink alcoholic beverages -Take any medication unless instructed by your physician -Make any legal decisions or sign important papers.  Meals: Start with liquid foods such as gelatin or soup. Progress to regular foods as tolerated. Avoid greasy, spicy, heavy foods. If nausea and/or vomiting occur, drink only clear liquids until the nausea and/or vomiting subsides. Call your physician if vomiting continues.  Special Instructions/Symptoms: Your throat may feel dry or sore from the anesthesia or the breathing tube placed in your throat during surgery. If this causes discomfort, gargle with warm salt water. The discomfort should disappear within 24 hours.  If you had a scopolamine patch placed behind your ear for the management of post- operative nausea and/or vomiting:  1. The medication in the patch is effective for 72 hours, after which it should be removed.  Wrap patch in a tissue and discard in the trash. Wash hands thoroughly with soap and water. 2. You may remove the patch earlier than 72 hours if you experience unpleasant side effects which may include dry mouth, dizziness or visual disturbances. 3. Avoid touching the patch. Wash your hands with soap and water after contact with the patch.

## 2024-02-13 NOTE — Op Note (Signed)
 Surgery Date: 02/13/2024  Surgeon(s): Sharl Selinda Dover, MD  Assistant: Franky Moores, PA-C  Assistant attestation:  PA Mcclung scrubbed and present for the entire procedure  ANESTHESIA:  general, quarter percent plain Marcaine  20 cc infiltrated locally  FLUIDS: Per anesthesia record.   ESTIMATED BLOOD LOSS: minimal  PREOPERATIVE DIAGNOSES:  1.  Left knee lateral meniscus tear 2.  Left knee anterior cruciate ligament tear  POSTOPERATIVE DIAGNOSES:  1.  Left knee lateral meniscus tear, bucket-handle 2.  Left knee anterior cruciate ligament tear 3.  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 Left knee arthroscopy with chondroplasty of lateral tibial plateau and lateral femoral condyle 2.  Left knee debridement of anterior cruciate ligament stump 3.  Left knee partial lateral meniscectomy  DESCRIPTION OF PROCEDURE: Ms. Zirkle is a 59 y.o.-year-old female with left knee lateral meniscus tear as well as intra cruciate ligament tear.  She has opted for conservative management of the anterior cruciate ligament with intervention today on the lateral meniscus.. Plans are to proceed with partial lateral meniscectomy and diagnostic arthroscopy with debridement as indicated. Full discussion held regarding risks benefits alternatives and complications related surgical intervention. Conservative care options reviewed. All questions answered.  The patient was identified in the preoperative holding area and the operative extremity was marked. The patient was brought to the operating room and transferred to operating table in a supine position. Satisfactory general anesthesia was induced by anesthesiology.    Standard anterolateral, anteromedial arthroscopy portals were obtained. The anteromedial portal was obtained with a spinal needle for localization under direct visualization with subsequent diagnostic findings.    Anteromedial and anterolateral chambers: moderate synovitis. The synovitis was debrided with a 4.5 mm full radius shaver through both the anteromedial and lateral portals.   Suprapatellar pouch and gutters: moderate synovitis or debris. Patella chondral surface: Grade 2 Trochlear chondral surface: Grade 4 Patellofemoral tracking: Midline level Medial meniscus: Intact with no tear.  Medial femoral condyle flexion bearing surface: Grade 0 Medial femoral condyle extension bearing surface: Grade 0 Medial tibial plateau: Grade 1 Anterior cruciate ligament: Completely torn in the mid substance with the tibial stump flipped back into the anterior Hoffa's fat pad.  This was scarred down with some chronicity Posterior cruciate ligament:stable Lateral meniscus: Bucket-handle tear flipped into the intercondylar notch.  There was also some residual horizontal tearing of the mid body.   Lateral femoral condyle flexion bearing surface: Grade 4 Lateral femoral condyle extension bearing surface: Grade 3 Lateral tibial plateau: Grade 3  We began the procedure with debridement of the anterior interval and completed a synovectomy from the intraportal position between her anterior medial portals.  Next, the residual tibial stump of the ACL was resected with motorized shaver.  We then turned our attention to the lateral meniscus tear.  This was a bucket-handle tear that was flipped into the intercondylar notch.  We utilized a meniscal biter to truncate the tear at its origins.  This did liberate that piece and we were able to remove that with a biter.  Dannielle was used to remove the residual fragments.  Next, we inspected the residual lateral meniscus remnant.  This had horizontal tear noted at the mid body.  We addressed this by resecting the inferior leaflet with meniscal biter and shaver.  After completion of synovectomy, diagnostic exam, and debridements as described, all compartments were checked and no  residual debris remained. Hemostasis was achieved with the cautery wand. The  portals were approximated with buried monocryl. All excess fluid was expressed from the joint.  Xeroform sterile gauze dressings were applied followed by Ace bandage and ice pack.   DISPOSITION: The patient was awakened from general anesthetic, extubated, taken to the recovery room in medically stable condition, no apparent complications. The patient may be weightbearing as tolerated to the operative lower extremity.  Range of motion of left knee as tolerated.

## 2024-02-13 NOTE — Anesthesia Procedure Notes (Signed)
 Procedure Name: LMA Insertion Date/Time: 02/13/2024 9:56 AM  Performed by: Delayne Olam BIRCH, CRNAPre-anesthesia Checklist: Patient identified, Emergency Drugs available, Suction available and Patient being monitored Patient Re-evaluated:Patient Re-evaluated prior to induction Oxygen Delivery Method: Circle system utilized Preoxygenation: Pre-oxygenation with 100% oxygen Induction Type: IV induction Ventilation: Mask ventilation without difficulty LMA: LMA inserted LMA Size: 4.0 Number of attempts: 1 Airway Equipment and Method: Bite block Placement Confirmation: positive ETCO2 Tube secured with: Tape Dental Injury: Teeth and Oropharynx as per pre-operative assessment

## 2024-02-16 ENCOUNTER — Encounter (HOSPITAL_BASED_OUTPATIENT_CLINIC_OR_DEPARTMENT_OTHER): Payer: Self-pay | Admitting: Orthopedic Surgery
